# Patient Record
Sex: Male | Born: 1940 | Race: White | Hispanic: No | Marital: Married | Smoking: Former smoker
Health system: Southern US, Community
[De-identification: ages and names within clinical notes are randomized; demographics above are authoritative.]

## PROBLEM LIST (undated history)

## (undated) DIAGNOSIS — Z87442 Personal history of urinary calculi: Secondary | ICD-10-CM

## (undated) DIAGNOSIS — N400 Enlarged prostate without lower urinary tract symptoms: Secondary | ICD-10-CM

## (undated) DIAGNOSIS — K219 Gastro-esophageal reflux disease without esophagitis: Secondary | ICD-10-CM

## (undated) DIAGNOSIS — I10 Essential (primary) hypertension: Secondary | ICD-10-CM

## (undated) DIAGNOSIS — C182 Malignant neoplasm of ascending colon: Secondary | ICD-10-CM

## (undated) DIAGNOSIS — M1990 Unspecified osteoarthritis, unspecified site: Secondary | ICD-10-CM

## (undated) DIAGNOSIS — R0600 Dyspnea, unspecified: Secondary | ICD-10-CM

## (undated) DIAGNOSIS — R350 Frequency of micturition: Secondary | ICD-10-CM

---

## 1977-12-03 HISTORY — PX: NASAL SINUS SURGERY: SHX719

## 2002-03-14 ENCOUNTER — Emergency Department (HOSPITAL_COMMUNITY): Admission: EM | Admit: 2002-03-14 | Discharge: 2002-03-14 | Payer: Self-pay | Admitting: Emergency Medicine

## 2013-06-22 ENCOUNTER — Other Ambulatory Visit: Payer: Self-pay | Admitting: Orthopedic Surgery

## 2013-06-22 ENCOUNTER — Encounter (HOSPITAL_COMMUNITY): Payer: Self-pay | Admitting: Pharmacy Technician

## 2013-06-24 ENCOUNTER — Other Ambulatory Visit: Payer: Self-pay | Admitting: Orthopedic Surgery

## 2013-06-24 ENCOUNTER — Encounter (HOSPITAL_COMMUNITY): Payer: Self-pay

## 2013-06-24 ENCOUNTER — Ambulatory Visit (HOSPITAL_COMMUNITY)
Admission: RE | Admit: 2013-06-24 | Discharge: 2013-06-24 | Disposition: A | Discharge status: Home / Self Care | Payer: Medicare Other | Source: Ambulatory Visit | Attending: Orthopedic Surgery | Admitting: Orthopedic Surgery

## 2013-06-24 ENCOUNTER — Encounter (HOSPITAL_COMMUNITY)
Admission: RE | Admit: 2013-06-24 | Discharge: 2013-06-24 | Disposition: A | Discharge status: Home / Self Care | Payer: Medicare Other | Source: Ambulatory Visit | Attending: Orthopedic Surgery | Admitting: Orthopedic Surgery

## 2013-06-24 DIAGNOSIS — Z01812 Encounter for preprocedural laboratory examination: Secondary | ICD-10-CM | POA: Insufficient documentation

## 2013-06-24 DIAGNOSIS — M19019 Primary osteoarthritis, unspecified shoulder: Secondary | ICD-10-CM | POA: Insufficient documentation

## 2013-06-24 DIAGNOSIS — I498 Other specified cardiac arrhythmias: Secondary | ICD-10-CM | POA: Insufficient documentation

## 2013-06-24 DIAGNOSIS — Z01810 Encounter for preprocedural cardiovascular examination: Secondary | ICD-10-CM | POA: Insufficient documentation

## 2013-06-24 DIAGNOSIS — I10 Essential (primary) hypertension: Secondary | ICD-10-CM | POA: Insufficient documentation

## 2013-06-24 HISTORY — DX: Essential (primary) hypertension: I10

## 2013-06-24 HISTORY — DX: Unspecified osteoarthritis, unspecified site: M19.90

## 2013-06-24 HISTORY — DX: Frequency of micturition: R35.0

## 2013-06-24 LAB — URINE MICROSCOPIC-ADD ON

## 2013-06-24 LAB — COMPREHENSIVE METABOLIC PANEL
ALT: 21 U/L (ref 0–53)
Alkaline Phosphatase: 96 U/L (ref 39–117)
BUN: 17 mg/dL (ref 6–23)
CO2: 29 mEq/L (ref 19–32)
Chloride: 103 mEq/L (ref 96–112)
GFR calc Af Amer: >90 mL/min (ref >90)
GFR calc non Af Amer: 82 mL/min — ABNORMAL LOW (ref >90)
Glucose, Bld: 92 mg/dL (ref 70–99)
Potassium: 4.6 mEq/L (ref 3.5–5.1)
Sodium: 139 mEq/L (ref 135–145)
Total Bilirubin: 0.6 mg/dL (ref 0.3–1.2)
Total Protein: 7.4 g/dL (ref 6.0–8.3)

## 2013-06-24 LAB — URINALYSIS, ROUTINE W REFLEX MICROSCOPIC
Specific Gravity, Urine: 1.026 (ref 1.005–1.030)
Urobilinogen, UA: 0.2 mg/dL (ref 0.0–1.0)
pH: 6.5 (ref 5.0–8.0)

## 2013-06-24 LAB — CBC
HCT: 44.8 % (ref 39.0–52.0)
Hemoglobin: 15.7 g/dL (ref 13.0–17.0)
RBC: 4.95 MIL/uL (ref 4.22–5.81)
WBC: 7.1 10*3/uL (ref 4.0–10.5)

## 2013-06-24 LAB — PROTIME-INR: Prothrombin Time: 12.6 seconds (ref 11.6–15.2)

## 2013-06-24 LAB — TYPE AND SCREEN: Antibody Screen: NEGATIVE

## 2013-06-24 LAB — APTT: aPTT: 29 seconds (ref 24–37)

## 2013-06-24 NOTE — Progress Notes (Signed)
Consent form for total shoulder arthroplasty,reason given as left knee pain.  Prep also written for knee not shoulder.  Spoke with Sherrie at Dr. Greig Right office she will get orders clairified.

## 2013-06-24 NOTE — Pre-Procedure Instructions (Signed)
Kevin Mathews  06/24/2013   Your procedure is scheduled on:  07-01-2013  Wednesday   Report to [facility] Short Stay Center at 10:30 AM.  Call this number if you have problems the morning of surgery: [phone]   Remember:   Do not eat food or drink liquids after midnight.    Take these medicines the morning of surgery with A SIP OF WATER:    Do not wear jewelry,  Do not wear lotions, powders, or perfumes.   Do not shave 48 hours prior to surgery. Men may shave face and neck.  Do not bring valuables to the hospital.  [facility] is not responsible for any belongings or valuables.  Contacts, dentures or bridgework may not be worn into surgery.  Leave suitcase in the car. After surgery it may be brought to your room.   For patients admitted to the hospital, checkout time is 11:00 AM the day of discharge.   Patients discharged the day of surgery will not be allowed to drive home.    Special Instructions: Shower using CHG 2 nights before surgery and the night before surgery.  If you shower the day of surgery use CHG.  Use special wash - you have one bottle of CHG for all showers.  You should use approximately 1/3 of the bottle for each shower.   Please read over the following fact sheets that you were given: Pain Booklet, Coughing and Deep Breathing, Blood Transfusion Information and Surgical Site Infection Prevention

## 2013-06-30 MED ORDER — CEFAZOLIN SODIUM-DEXTROSE 2-3 GM-% IV SOLR
2.0000 g | INTRAVENOUS | Status: AC
  Administered 2013-07-01: 2 g via INTRAVENOUS
  Filled 2013-06-30: qty 50

## 2013-06-30 NOTE — Progress Notes (Signed)
Pt called and informed of time change, instructed to be at Short Stay at 0830.

## 2013-06-30 NOTE — H&P (Signed)
  [facility] [address]   [address] [place], [place] [address] [phone] A Division of [facility]  Loreta Ave, M.D.   Robert A. Thurston Hole, M.D.   Burnell Blanks, M.D.   Eulas Post, M.D.   Lunette Stands, M.D Buford Dresser, M.D.  Charlsie Quest, M.D.   Estell Harpin, M.D.   Melina Fiddler, M.D. Genene Churn. Barry Dienes, PA-C            Kirstin A. Shepperson, PA-C Josh [place], PA-C [place], [place]   RE: Jaevon, Paras                                [id number]      DOB: 04-03-1941 PROGRESS NOTE: 06-16-13 Mr. Derinda Late is a 72 year-old man who has a longstanding history of severe glenohumeral pain in his left shoulder.  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  He has failed conservative treatment including a steroid injection, anti-inflammatories, rest and physical therapy.  Symptoms are unchanged and he wishes to proceed with left shoulder total joint replacement.   Current medications: Pravastatin, Hydrochlorothiazide and baby Aspirin daily. Allergies: No known drug allergies. Past surgical history: Significant for nasal surgery on his sinuses in 1979. Past medical history: Significant for hypertension and hyperlipidemia.   Primary care physician: Dr. Kirby Funk. Review of systems: Patient denies any recent illness, lightheadedness, fevers, chills, cough, cardiac, pulmonary, GI, GU or neuro issues.  Social history: He did smoke, but stopped smoking in 1970.  He does not use alcoholic beverages.  Patient is married and lives in a two story home.  He is self-employed and is currently working.       EXAMINATION: Height: 5?8.  Weight: 176 pounds.  BMI: 26.8.  Pulse: 57.  Blood pressure: 131/75.  He is alert and oriented x 3 and in no acute distress.  Head is normocephalic, a traumatic.  PERRLA, EOMI.  Lungs: CTA bilaterally.  No wheezes.  Heart: RRR.  No murmurs.   Abdomen: Soft and non-tender.  No masses.  Examination of the left shoulder shows 20 degrees loss of flexion compared to the opposite side.  Positive pain with abduction and again only to about 90 degrees versus well over 100 on the right.  Crossed chest adduction also painful.  Otherwise neurovascularly intact.     X-RAYS: Three view x-ray of the shoulder shows the extensive degenerative changes as mentioned above with bone on bone with erosive changes on both sides of the joint.  MRI showing rotator cuff intact.    DISPOSITION:  The patient wishes to proceed with left total shoulder replacement.  Risks and benefits were discussed.  Possible complications were discussed in detail.  All of his questions were answered regarding his surgery.  He will return to see Dr. Eulah Pont postoperatively.  He hopes to go home following this procedure to begin both home and outpatient physical therapy, following what will be a simple overnight stay in the hospital.    Loreta Ave, M.D.   Electronically verified by Loreta Ave, M.D. DFM(BR):jjh D 06-18-13 T 06-19-13

## 2013-07-01 ENCOUNTER — Inpatient Hospital Stay (HOSPITAL_COMMUNITY): Payer: Medicare Other | Admitting: Anesthesiology

## 2013-07-01 ENCOUNTER — Encounter (HOSPITAL_COMMUNITY): Payer: Self-pay | Admitting: Anesthesiology

## 2013-07-01 ENCOUNTER — Encounter (HOSPITAL_COMMUNITY)
Admission: RE | Disposition: A | Discharge status: Home / Self Care | Payer: Self-pay | Source: Ambulatory Visit | Attending: Orthopedic Surgery

## 2013-07-01 ENCOUNTER — Observation Stay (HOSPITAL_COMMUNITY): Payer: Medicare Other

## 2013-07-01 ENCOUNTER — Observation Stay (HOSPITAL_COMMUNITY)
Admission: RE | Admit: 2013-07-01 | Discharge: 2013-07-02 | Disposition: A | Discharge status: Home / Self Care | Payer: Medicare Other | Source: Ambulatory Visit | Attending: Orthopedic Surgery | Admitting: Orthopedic Surgery

## 2013-07-01 DIAGNOSIS — I10 Essential (primary) hypertension: Secondary | ICD-10-CM | POA: Insufficient documentation

## 2013-07-01 DIAGNOSIS — M19019 Primary osteoarthritis, unspecified shoulder: Principal | ICD-10-CM | POA: Insufficient documentation

## 2013-07-01 DIAGNOSIS — M19012 Primary osteoarthritis, left shoulder: Secondary | ICD-10-CM | POA: Diagnosis present

## 2013-07-01 DIAGNOSIS — Z79899 Other long term (current) drug therapy: Secondary | ICD-10-CM | POA: Insufficient documentation

## 2013-07-01 DIAGNOSIS — E785 Hyperlipidemia, unspecified: Secondary | ICD-10-CM | POA: Insufficient documentation

## 2013-07-01 HISTORY — PX: TOTAL SHOULDER ARTHROPLASTY: SHX126

## 2013-07-01 SURGERY — ARTHROPLASTY, SHOULDER, TOTAL
Anesthesia: General | Site: Shoulder | Laterality: Left | Wound class: Clean

## 2013-07-01 MED ORDER — SODIUM CHLORIDE 0.9 % IV SOLN
10.0000 mg | INTRAVENOUS | Status: DC | PRN
  Administered 2013-07-01: 10 ug/min via INTRAVENOUS

## 2013-07-01 MED ORDER — LACTATED RINGERS IV SOLN: INTRAVENOUS | Status: DC

## 2013-07-01 MED ORDER — METHOCARBAMOL 100 MG/ML IJ SOLN
500.0000 mg | Freq: Four times a day (QID) | INTRAVENOUS | Status: DC | PRN
  Filled 2013-07-01: qty 5

## 2013-07-01 MED ORDER — MIDAZOLAM HCL 2 MG/2ML IJ SOLN
INTRAMUSCULAR | Status: AC
  Administered 2013-07-01: 1 mg
  Filled 2013-07-01: qty 2

## 2013-07-01 MED ORDER — GLYCOPYRROLATE 0.2 MG/ML IJ SOLN
INTRAMUSCULAR | Status: DC | PRN
  Administered 2013-07-01: 0.6 mg via INTRAVENOUS

## 2013-07-01 MED ORDER — OXYCODONE HCL 5 MG PO TABS: 5.0000 mg | ORAL_TABLET | Freq: Once | ORAL | Status: DC | PRN

## 2013-07-01 MED ORDER — DOCUSATE SODIUM 100 MG PO CAPS
100.0000 mg | ORAL_CAPSULE | Freq: Two times a day (BID) | ORAL | Status: DC
  Administered 2013-07-01 – 2013-07-02 (×2): 100 mg via ORAL
  Filled 2013-07-01 (×2): qty 1

## 2013-07-01 MED ORDER — HYDROMORPHONE HCL PF 1 MG/ML IJ SOLN
INTRAMUSCULAR | Status: AC
  Filled 2013-07-01: qty 1

## 2013-07-01 MED ORDER — LIDOCAINE HCL (CARDIAC) 20 MG/ML IV SOLN
INTRAVENOUS | Status: DC | PRN
  Administered 2013-07-01: 20 mg via INTRAVENOUS

## 2013-07-01 MED ORDER — ONDANSETRON HCL 4 MG/2ML IJ SOLN
INTRAMUSCULAR | Status: DC | PRN
  Administered 2013-07-01: 4 mg via INTRAVENOUS

## 2013-07-01 MED ORDER — HYDROMORPHONE HCL PF 1 MG/ML IJ SOLN
0.2500 mg | INTRAMUSCULAR | Status: DC | PRN
  Administered 2013-07-01 (×2): 0.5 mg via INTRAVENOUS

## 2013-07-01 MED ORDER — METOCLOPRAMIDE HCL 10 MG PO TABS
5.0000 mg | ORAL_TABLET | Freq: Three times a day (TID) | ORAL | Status: DC | PRN
  Administered 2013-07-02: 10 mg via ORAL
  Filled 2013-07-01: qty 1

## 2013-07-01 MED ORDER — LACTATED RINGERS IV SOLN
INTRAVENOUS | Status: DC
  Administered 2013-07-01 (×2): via INTRAVENOUS

## 2013-07-01 MED ORDER — SODIUM CHLORIDE 0.9 % IR SOLN
Status: DC | PRN
  Administered 2013-07-01: 1000 mL

## 2013-07-01 MED ORDER — METHOCARBAMOL 500 MG PO TABS
500.0000 mg | ORAL_TABLET | Freq: Four times a day (QID) | ORAL | Status: DC | PRN
  Administered 2013-07-01: 500 mg via ORAL
  Filled 2013-07-01: qty 1

## 2013-07-01 MED ORDER — SIMVASTATIN 20 MG PO TABS
20.0000 mg | ORAL_TABLET | Freq: Every day | ORAL | Status: DC
  Administered 2013-07-01: 20 mg via ORAL
  Filled 2013-07-01 (×2): qty 1

## 2013-07-01 MED ORDER — FENTANYL CITRATE 0.05 MG/ML IJ SOLN
INTRAMUSCULAR | Status: AC
  Administered 2013-07-01: 50 ug
  Filled 2013-07-01: qty 2

## 2013-07-01 MED ORDER — HYDROCODONE-ACETAMINOPHEN 5-325 MG PO TABS
1.0000 | ORAL_TABLET | ORAL | Status: DC | PRN
  Administered 2013-07-01: 2 via ORAL
  Filled 2013-07-01: qty 2

## 2013-07-01 MED ORDER — EPHEDRINE SULFATE 50 MG/ML IJ SOLN
INTRAMUSCULAR | Status: DC | PRN
  Administered 2013-07-01 (×2): 10 mg via INTRAVENOUS
  Administered 2013-07-01: 5 mg via INTRAVENOUS
  Administered 2013-07-01: 10 mg via INTRAVENOUS

## 2013-07-01 MED ORDER — ONDANSETRON HCL 4 MG/2ML IJ SOLN: 4.0000 mg | Freq: Four times a day (QID) | INTRAMUSCULAR | Status: DC | PRN

## 2013-07-01 MED ORDER — BUPIVACAINE-EPINEPHRINE PF 0.5-1:200000 % IJ SOLN
INTRAMUSCULAR | Status: DC | PRN
  Administered 2013-07-01: 30 mL

## 2013-07-01 MED ORDER — METOCLOPRAMIDE HCL 5 MG/ML IJ SOLN: 5.0000 mg | Freq: Three times a day (TID) | INTRAMUSCULAR | Status: DC | PRN

## 2013-07-01 MED ORDER — HYDROCHLOROTHIAZIDE 12.5 MG PO CAPS
12.5000 mg | ORAL_CAPSULE | Freq: Every day | ORAL | Status: DC
  Administered 2013-07-01 – 2013-07-02 (×2): 12.5 mg via ORAL
  Filled 2013-07-01 (×2): qty 1

## 2013-07-01 MED ORDER — FENTANYL CITRATE 0.05 MG/ML IJ SOLN
INTRAMUSCULAR | Status: DC | PRN
  Administered 2013-07-01: 50 ug via INTRAVENOUS
  Administered 2013-07-01: 100 ug via INTRAVENOUS
  Administered 2013-07-01 (×2): 50 ug via INTRAVENOUS

## 2013-07-01 MED ORDER — CEFAZOLIN SODIUM 1-5 GM-% IV SOLN
1.0000 g | Freq: Four times a day (QID) | INTRAVENOUS | Status: AC
  Administered 2013-07-02 (×2): 1 g via INTRAVENOUS
  Filled 2013-07-01 (×3): qty 50

## 2013-07-01 MED ORDER — CHLORHEXIDINE GLUCONATE 4 % EX LIQD: 60.0000 mL | Freq: Once | CUTANEOUS | Status: DC

## 2013-07-01 MED ORDER — NEOSTIGMINE METHYLSULFATE 1 MG/ML IJ SOLN
INTRAMUSCULAR | Status: DC | PRN
  Administered 2013-07-01: 3 mg via INTRAVENOUS

## 2013-07-01 MED ORDER — PROPOFOL 10 MG/ML IV BOLUS
INTRAVENOUS | Status: DC | PRN
  Administered 2013-07-01: 200 mg via INTRAVENOUS

## 2013-07-01 MED ORDER — OXYCODONE HCL 5 MG/5ML PO SOLN: 5.0000 mg | Freq: Once | ORAL | Status: DC | PRN

## 2013-07-01 MED ORDER — CEFAZOLIN SODIUM 1-5 GM-% IV SOLN
1.0000 g | Freq: Three times a day (TID) | INTRAVENOUS | Status: AC
  Administered 2013-07-02: 1 g via INTRAVENOUS
  Filled 2013-07-01 (×3): qty 50

## 2013-07-01 MED ORDER — HYDROMORPHONE HCL PF 1 MG/ML IJ SOLN: 0.5000 mg | INTRAMUSCULAR | Status: DC | PRN

## 2013-07-01 MED ORDER — OXYCODONE-ACETAMINOPHEN 5-325 MG PO TABS
1.0000 | ORAL_TABLET | ORAL | Status: DC | PRN
  Administered 2013-07-01 – 2013-07-02 (×2): 2 via ORAL
  Filled 2013-07-01 (×3): qty 2

## 2013-07-01 MED ORDER — ONDANSETRON HCL 4 MG PO TABS: 4.0000 mg | ORAL_TABLET | Freq: Four times a day (QID) | ORAL | Status: DC | PRN

## 2013-07-01 MED ORDER — ROCURONIUM BROMIDE 100 MG/10ML IV SOLN
INTRAVENOUS | Status: DC | PRN
  Administered 2013-07-01: 50 mg via INTRAVENOUS

## 2013-07-01 MED ORDER — MIDAZOLAM HCL 5 MG/5ML IJ SOLN
INTRAMUSCULAR | Status: DC | PRN
  Administered 2013-07-01: 2 mg via INTRAVENOUS

## 2013-07-01 MED ORDER — PHENYLEPHRINE HCL 10 MG/ML IJ SOLN
INTRAMUSCULAR | Status: DC | PRN
  Administered 2013-07-01: 120 ug via INTRAVENOUS
  Administered 2013-07-01 (×2): 80 ug via INTRAVENOUS

## 2013-07-01 MED ORDER — METOCLOPRAMIDE HCL 5 MG/ML IJ SOLN: 10.0000 mg | Freq: Once | INTRAMUSCULAR | Status: DC | PRN

## 2013-07-01 SURGICAL SUPPLY — 74 items
BANDAGE GAUZE ELAST BULKY 4 IN (GAUZE/BANDAGES/DRESSINGS) ×2 IMPLANT
BENZOIN TINCTURE PRP APPL 2/3 (GAUZE/BANDAGES/DRESSINGS) IMPLANT
BLADE SAW SGTL 83.5X18.5 (BLADE) ×2 IMPLANT
BOOTCOVER CLEANROOM LRG (PROTECTIVE WEAR) IMPLANT
BOWL SMART MIX CTS (DISPOSABLE) ×2 IMPLANT
BRUSH FEMORAL CANAL (MISCELLANEOUS) IMPLANT
BUR SURG 4X8 MED (BURR) IMPLANT
BURR SURG 4X8 MED (BURR)
CEMENT BONE SIMPLEX SPEEDSET (Cement) ×2 IMPLANT
CLOTH BEACON ORANGE TIMEOUT ST (SAFETY) ×2 IMPLANT
CLSR STERI-STRIP ANTIMIC 1/2X4 (GAUZE/BANDAGES/DRESSINGS) ×2 IMPLANT
COVER MAYO STAND STRL (DRAPES) ×4 IMPLANT
COVER SURGICAL LIGHT HANDLE (MISCELLANEOUS) ×2 IMPLANT
DRAPE C-ARM 42X72 X-RAY (DRAPES) IMPLANT
DRAPE INCISE IOBAN 66X45 STRL (DRAPES) ×2 IMPLANT
DRAPE U-SHAPE 47X51 STRL (DRAPES) ×2 IMPLANT
DRSG MEPILEX BORDER 4X8 (GAUZE/BANDAGES/DRESSINGS) ×2 IMPLANT
DRSG PAD ABDOMINAL 8X10 ST (GAUZE/BANDAGES/DRESSINGS) IMPLANT
DURAPREP 26ML APPLICATOR (WOUND CARE) ×2 IMPLANT
ELECT CAUTERY BLADE 6.4 (BLADE) ×2 IMPLANT
ELECT REM PT RETURN 9FT ADLT (ELECTROSURGICAL) ×2
ELECTRODE REM PT RTRN 9FT ADLT (ELECTROSURGICAL) ×1 IMPLANT
EVACUATOR 1/8 PVC DRAIN (DRAIN) IMPLANT
FACESHIELD LNG OPTICON STERILE (SAFETY) IMPLANT
GAUZE XEROFORM 5X9 LF (GAUZE/BANDAGES/DRESSINGS) IMPLANT
GLENOID SELF-PRESSURIZING SHLD (Shoulder) ×2 IMPLANT
GLOVE BIO SURGEON STRL SZ7 (GLOVE) ×2 IMPLANT
GLOVE BIO SURGEON STRL SZ8.5 (GLOVE) ×4 IMPLANT
GLOVE BIOGEL PI IND STRL 6.5 (GLOVE) ×1 IMPLANT
GLOVE BIOGEL PI IND STRL 7.0 (GLOVE) ×1 IMPLANT
GLOVE BIOGEL PI IND STRL 7.5 (GLOVE) ×1 IMPLANT
GLOVE BIOGEL PI IND STRL 8 (GLOVE) ×1 IMPLANT
GLOVE BIOGEL PI INDICATOR 6.5 (GLOVE) ×1
GLOVE BIOGEL PI INDICATOR 7.0 (GLOVE) ×1
GLOVE BIOGEL PI INDICATOR 7.5 (GLOVE) ×1
GLOVE BIOGEL PI INDICATOR 8 (GLOVE) ×1
GLOVE ORTHO TXT STRL SZ7.5 (GLOVE) ×4 IMPLANT
GLOVE SURG SS PI 6.5 STRL IVOR (GLOVE) ×2 IMPLANT
GOWN PREVENTION PLUS XLARGE (GOWN DISPOSABLE) ×4 IMPLANT
GOWN STRL NON-REIN LRG LVL3 (GOWN DISPOSABLE) ×6 IMPLANT
HANDPIECE INTERPULSE COAX TIP (DISPOSABLE)
HEAD HUMERAL SNGL RADIUS 21MM (Head) ×2 IMPLANT
KIT BASIN OR (CUSTOM PROCEDURE TRAY) ×2 IMPLANT
KIT ROOM TURNOVER OR (KITS) ×2 IMPLANT
MANIFOLD NEPTUNE II (INSTRUMENTS) ×2 IMPLANT
NEEDLE 1/2 CIR CATGUT .05X1.09 (NEEDLE) ×2 IMPLANT
NEEDLE HYPO 25GX1X1/2 BEV (NEEDLE) ×2 IMPLANT
NS IRRIG 1000ML POUR BTL (IV SOLUTION) ×2 IMPLANT
PACK SHOULDER (CUSTOM PROCEDURE TRAY) ×2 IMPLANT
PAD ARMBOARD 7.5X6 YLW CONV (MISCELLANEOUS) ×4 IMPLANT
PASSER SUT SWANSON 36MM LOOP (INSTRUMENTS) IMPLANT
SET HNDPC FAN SPRY TIP SCT (DISPOSABLE) IMPLANT
SLING ARM IMMOBILIZER LRG (SOFTGOODS) ×2 IMPLANT
SPONGE GAUZE 4X4 12PLY (GAUZE/BANDAGES/DRESSINGS) IMPLANT
SPONGE LAP 18X18 X RAY DECT (DISPOSABLE) ×2 IMPLANT
STEM HUMERAL MODULAR 128MM (Stem) ×2 IMPLANT
STRIP CLOSURE SKIN 1/2X4 (GAUZE/BANDAGES/DRESSINGS) ×2 IMPLANT
SUCTION FRAZIER TIP 10 FR DISP (SUCTIONS) ×2 IMPLANT
SUT FIBERWIRE #2 38 T-5 BLUE (SUTURE) ×8
SUT MNCRL AB 3-0 PS2 18 (SUTURE) ×2 IMPLANT
SUT SILK 2 0 (SUTURE) ×1
SUT SILK 2-0 18XBRD TIE 12 (SUTURE) ×1 IMPLANT
SUT VIC AB 2-0 CT1 27 (SUTURE) ×1
SUT VIC AB 2-0 CT1 TAPERPNT 27 (SUTURE) ×1 IMPLANT
SUTURE FIBERWR #2 38 T-5 BLUE (SUTURE) ×4 IMPLANT
SWABSTICK BENZOIN STERILE (MISCELLANEOUS) ×2 IMPLANT
SYR CONTROL 10ML LL (SYRINGE) ×2 IMPLANT
SYR TOOMEY 50ML (SYRINGE) IMPLANT
TAPE CLOTH SURG 6X10 WHT LF (GAUZE/BANDAGES/DRESSINGS) ×2 IMPLANT
TOWEL OR 17X24 6PK STRL BLUE (TOWEL DISPOSABLE) ×2 IMPLANT
TOWEL OR 17X26 10 PK STRL BLUE (TOWEL DISPOSABLE) ×2 IMPLANT
TOWER CARTRIDGE SMART MIX (DISPOSABLE) IMPLANT
TRAY FOLEY CATH 16FRSI W/METER (SET/KITS/TRAYS/PACK) ×2 IMPLANT
WATER STERILE IRR 1000ML POUR (IV SOLUTION) ×2 IMPLANT

## 2013-07-01 NOTE — Transfer of Care (Signed)
Immediate Anesthesia Transfer of Care Note  Patient: Kevin Mathews  Procedure(s) Performed: Procedure(s) with comments: TOTAL SHOULDER ARTHROPLASTY (Left) - with interscalene block  Patient Location: PACU  Anesthesia Type:General  Level of Consciousness: awake, alert , oriented and patient cooperative  Airway & Oxygen Therapy: Patient Spontanous Breathing  Post-op Assessment: Report given to PACU RN, Post -op Vital signs reviewed and stable and Patient moving all extremities  Post vital signs: Reviewed and stable  Complications: No apparent anesthesia complications

## 2013-07-01 NOTE — Interval H&P Note (Signed)
History and Physical Interval Note:  07/01/2013 8:22 AM  Irish Elders  has presented today for surgery, with the diagnosis of DJD LEFT SHOULDER  The various methods of treatment have been discussed with the patient and family. After consideration of risks, benefits and other options for treatment, the patient has consented to  Procedure(s): TOTAL SHOULDER ARTHROPLASTY (Left) as a surgical intervention .  The patient's history has been reviewed, patient examined, no change in status, stable for surgery.  I have reviewed the patient's chart and labs.  Questions were answered to the patient's satisfaction.     MURPHY,DANIEL F

## 2013-07-01 NOTE — Anesthesia Procedure Notes (Addendum)
Anesthesia Regional Block:  Interscalene brachial plexus block  Pre-Anesthetic Checklist: ,, timeout performed, Correct Patient, Correct Site, Correct Laterality, Correct Procedure, Correct Position, site marked, Risks and benefits discussed,  Surgical consent,  Pre-op evaluation,  At surgeon's request and post-op pain management  Laterality: Left  Prep: chloraprep       Needles:   Needle Type: Stimulator Needle - 40      Needle Gauge: 22 and 22 G    Additional Needles:  Procedures: nerve stimulator Interscalene brachial plexus block  Nerve Stimulator or Paresthesia:  Response: forearm twitch, 0.45 mA, 0.1 ms,   Additional Responses:   Narrative:  Start time: 07/01/2013 10:31 AM End time: 07/01/2013 10:38 AM Injection made incrementally with aspirations every 5 mL.  Performed by: Personally  Anesthesiologist: Sandford Craze, MD  Additional Notes: Pt identified in Holding room.  Monitors applied. Working IV access confirmed. Sterile prep L neck.  #22ga PNS to forearm twitch at 0.56mA threshold.  30cc 0.5% Bupivacaine with 1:200k epi injected incrementally after negative test dose.  Patient asymptomatic, VSS, no heme aspirated, tolerated well.  Sandford Craze, MD  Interscalene brachial plexus block Procedure Name: Intubation Date/Time: 07/01/2013 11:50 AM Performed by: Sharlene Dory E Pre-anesthesia Checklist: Patient identified, Emergency Drugs available, Suction available, Patient being monitored and Timeout performed Patient Re-evaluated:Patient Re-evaluated prior to inductionOxygen Delivery Method: Circle system utilized Preoxygenation: Pre-oxygenation with 100% oxygen Intubation Type: IV induction Ventilation: Mask ventilation without difficulty Laryngoscope Size: Mac and 3 Grade View: Grade I Tube type: Oral Tube size: 7.5 mm Number of attempts: 1 Airway Equipment and Method: Stylet Placement Confirmation: ETT inserted through vocal cords under direct vision,  positive ETCO2  and breath sounds checked- equal and bilateral Secured at: 23 cm Tube secured with: Tape Dental Injury: Teeth and Oropharynx as per pre-operative assessment

## 2013-07-01 NOTE — Preoperative (Signed)
Beta Blockers   Reason not to administer Beta Blockers:Not Applicable 

## 2013-07-01 NOTE — Anesthesia Preprocedure Evaluation (Addendum)
Anesthesia Evaluation  Patient identified by MRN, date of birth, ID band Patient awake    Reviewed: Allergy & Precautions, H&P , NPO status , Patient's Chart, lab work & pertinent test results  Airway Mallampati: I      Dental  (+) Teeth Intact   Pulmonary          Cardiovascular hypertension, Pt. on medications     Neuro/Psych    GI/Hepatic GERD-  ,  Endo/Other    Renal/GU      Musculoskeletal   Abdominal   Peds  Hematology   Anesthesia Other Findings   Reproductive/Obstetrics                           Anesthesia Physical Anesthesia Plan  ASA: II  Anesthesia Plan: General   Post-op Pain Management:    Induction: Intravenous  Airway Management Planned: Oral ETT  Additional Equipment:   Intra-op Plan:   Post-operative Plan: Extubation in OR  Informed Consent: I have reviewed the patients History and Physical, chart, labs and discussed the procedure including the risks, benefits and alternatives for the proposed anesthesia with the patient or authorized representative who has indicated his/her understanding and acceptance.   Dental advisory given  Plan Discussed with: CRNA and Anesthesiologist  Anesthesia Plan Comments:         Anesthesia Quick Evaluation

## 2013-07-02 MED ORDER — METHOCARBAMOL 500 MG PO TABS: 500.0000 mg | ORAL_TABLET | Freq: Four times a day (QID) | ORAL | Status: DC | PRN

## 2013-07-02 MED ORDER — OXYCODONE-ACETAMINOPHEN 5-325 MG PO TABS: 1.0000 | ORAL_TABLET | ORAL | Status: DC | PRN

## 2013-07-02 MED ORDER — DSS 100 MG PO CAPS: 100.0000 mg | ORAL_CAPSULE | Freq: Two times a day (BID) | ORAL | Status: DC

## 2013-07-02 NOTE — Progress Notes (Signed)
Subjective: 1 Day Post-Op Procedure(s) (LRB): TOTAL SHOULDER ARTHROPLASTY (Left) Patient reports pain as 3 on 0-10 scale.   No nausea,emesis  Objective: Vital signs in last 24 hours: Temp:  [97.6 F (36.4 C)-98.8 F (37.1 C)] 98.8 F (37.1 C) (07/31 0520) Pulse Rate:  [5-84] 69 (07/31 0520) Resp:  [11-20] 16 (07/31 0520) BP: (100-166)/(57-77) 122/63 mmHg (07/31 0520) SpO2:  [10 %-100 %] 95 % (07/31 0520)  Intake/Output from previous day: 07/30 0701 - 07/31 0700 In: 3400 [P.O.:1000; I.V.:1650] Out: 900 [Urine:700; Blood:200] Intake/Output this shift:    No results found for this basename: HGB,  in the last 72 hours No results found for this basename: WBC, RBC, HCT, PLT,  in the last 72 hours No results found for this basename: NA, K, CL, CO2, BUN, CREATININE, GLUCOSE, CALCIUM,  in the last 72 hours No results found for this basename: LABPT, INR,  in the last 72 hours  Neurologically intact ABD soft Intact pulses distally Excellent grip strength Dressing dry   Assessment/Plan: 1 Day Post-Op Procedure(s) (LRB): TOTAL SHOULDER ARTHROPLASTY (Left) Discharge home when voiding  ROBERTS,JANE B 07/02/2013, 8:42 AM

## 2013-07-02 NOTE — Progress Notes (Signed)
Discharge instructions given to patient and wife. Prescriptions given. Patient discharged home.

## 2013-07-02 NOTE — Op Note (Signed)
NAME:  Kevin Mathews, Kevin Mathews NO.:  [id number]  MEDICAL RECORD NO.:  [id number]  LOCATION:  [id number]                        FACILITY:  [facility]  PHYSICIAN:  Loreta Ave, M.D. DATE OF BIRTH:  [birth year]  DATE OF PROCEDURE:  07/01/2013 DATE OF DISCHARGE:                              OPERATIVE REPORT   PREOPERATIVE DIAGNOSIS:  Left shoulder end-stage degenerative arthritis with marked periarticular spurring and erosive changes, posterior glenoid.  POSTOPERATIVE DIAGNOSIS:  Left shoulder end-stage degenerative arthritis with marked periarticular spurring and erosive changes, posterior glenoid.  PROCEDURES:  Left total shoulder replacement utilizing Stryker prosthesis, cemented, pegged 48-mm glenoid component.  A press-fit 12-mm stem with a 48 x 21 humeral head.  This is utilizing the ReUnion total shoulder arthroplasty system.  SURGEON:  Loreta Ave, MD  ASSISTANT:  Margarita Rana, MD, present throughout the entire case, necessary for timely completion of procedure.  ANESTHESIA:  General.  ESTIMATED BLOOD LOSS:  200 mL.  BLOOD GIVEN:  None.  SPECIMENS:  None.  CULTURES:  None.  COMPLICATIONS:  None.  DRESSING:  Soft compressive, shoulder immobilizer.  DESCRIPTION OF PROCEDURE:  The patient was brought to the operating room, placed on the operating table in supine position.  After adequate anesthesia had been obtained, placed in a beach-chair position on the shoulder positioner, prepped and draped in usual sterile fashion. Anterior incision, deltopectoral interval.  Skin, subcu tissue divided. Deltopectoral interval was opened.  Conjoined tendon retracted.  Rotator cuff intact.  Subscap was taken down, anatomically retracted.  Utilizing appropriate guides, protected soft tissue.  The humeral head was cut at 135-degree angle, 30 degrees of retroversion.  Shoulder exposed. Periarticular spurs, loose bodies removed from the humerus and glenoid. The  humerus was delivered out.  Long-head biceps rotator cuff protected. This was brought up to good fitting for a 12-mm stem, 30 degrees retroversion.  The trial was left in place.  Attention was turned to the glenoid with appropriate retractors, brought up to good positioning, bringing the front of the glenoid down, debrided perpendicular to the shaft.  Drilled, sized, and fitted for a 48-mm component.  After appropriate trials, the wound was irrigated.  Cement prepared, the definitive 4 pegged glenoid firmly cemented in place.  Once cement hardened, attention was turned back to the humerus.  The appropriate trials were done.  Trials removed.  Definitive component was seated with a 12-mm stem.  The 48 x 21 head attached.  This gave good coverage, good sizing, a stable shoulder with full motion.  Wound irrigated.  Subscap repaired with FiberWire.  Wound irrigated again.  Deltopectoral interval closed and the skin and subcutaneous tissue with Vicryl.  Steri-Strips applied.  Sterile compressive dressing applied.  Shoulder immobilizer applied.  Anesthesia reversed.  Brought to the recovery room.  Tolerated the surgery well.  No complications.     Loreta Ave, M.D.     DFM/MEDQ  D:  07/01/2013  T:  07/02/2013  Job:  [id number]

## 2013-07-02 NOTE — Progress Notes (Signed)
UR COMPLETED  

## 2013-07-02 NOTE — Discharge Summary (Signed)
Patient ID: Kevin Mathews MRN: [id number] DOB/AGE: [birth year] 72 y.o.  Admit date: 07/01/2013 Discharge date:   Admission Diagnoses: Degenerative Joint Disease  LEFT SHOULDER Past Medical History  Diagnosis Date  . Hypertension   . Frequent urination   . Arthritis     Discharge Diagnoses:  Principal Problem:   Osteoarthritis of left shoulder   Surgeries: Procedure(s): TOTAL SHOULDER ARTHROPLASTY on 07/01/2013    Consultants:    Discharged Condition: Improved  Hospital Course: Kevin Mathews is an 72 y.o. male who was admitted 07/01/2013 with a chief complaint of No chief complaint on file. , and found to have a diagnosis of Degenerative Joint Disease  LEFT SHOULDER.  They were brought to the operating room on 07/01/2013 and underwent Procedure(s): TOTAL SHOULDER ARTHROPLASTY.    They were given perioperative antibiotics: Anti-infectives   Start     Dose/Rate Route Frequency Ordered Stop   07/02/13 0000  ceFAZolin (ANCEF) IVPB 1 g/50 mL premix     1 g 100 mL/hr over 30 Minutes Intravenous 3 times per day 07/01/13 2351 07/02/13 2159   07/01/13 1800  ceFAZolin (ANCEF) IVPB 1 g/50 mL premix     1 g 100 mL/hr over 30 Minutes Intravenous Every 6 hours 07/01/13 1748 07/02/13 1159   07/01/13 0600  ceFAZolin (ANCEF) IVPB 2 g/50 mL premix     2 g 100 mL/hr over 30 Minutes Intravenous On call to O.R. 06/30/13 1429 07/01/13 1155    .  They were given sequential compression devices, early ambulation, and Other (comment)resume advil for DVT prophylaxis.  Recent vital signs: Patient Vitals for the past 24 hrs:  BP Temp Pulse Resp SpO2  07/02/13 0520 122/63 mmHg 98.8 F (37.1 C) 69 16 95 %  07/02/13 0225 121/58 mmHg 98.4 F (36.9 C) 65 14 93 %  07/01/13 2209 100/74 mmHg 97.8 F (36.6 C) 84 16 98 %  07/01/13 1738 131/67 mmHg 97.9 F (36.6 C) 67 18 100 %  07/01/13 1700 - 97.6 F (36.4 C) - - -  07/01/13 1659 - - 64 17 98 %  07/01/13 1655 134/57 mmHg - 60 20 99 %  07/01/13  1645 - - 62 15 94 %  07/01/13 1640 128/72 mmHg - 58 11 97 %  07/01/13 1630 - - 57 14 96 %  07/01/13 1625 133/59 mmHg - 64 11 94 %  07/01/13 1615 - - 54 12 98 %  07/01/13 1610 132/58 mmHg - 56 12 96 %  07/01/13 1600 - - 59 14 99 %  07/01/13 1555 144/64 mmHg - 56 13 99 %  07/01/13 1545 - - 54 11 96 %  07/01/13 1540 129/77 mmHg - 57 18 97 %  07/01/13 1537 - 98.6 F (37 C) 61 17 80 %  07/01/13 1030 146/76 mmHg - 50 13 100 %  07/01/13 0917 166/72 mmHg 97.7 F (36.5 C) 5 18 10  %  .  Recent laboratory studies: Dg Shoulder Left Port  07/01/2013   *RADIOLOGY REPORT*  Clinical Data: Post replacement, osteoarthritis  PORTABLE LEFT SHOULDER - 2+ VIEW  Comparison: 06/24/2013  Findings: Left humeral head prosthesis projects in expected location on this single image.  Advanced degenerative changes of the glenoid are evident.  No fracture or other apparent abnormality.  IMPRESSION:  Left shoulder arthroplasty without fracture or other apparent complication.   Original Report Authenticated By: D. Andria Rhein, MD    Discharge Medications:     Medication List  DSS 100 MG Caps  Take 100 mg by mouth 2 (two) times daily.     hydrochlorothiazide 12.5 MG capsule  Commonly known as:  MICROZIDE  Take 12.5 mg by mouth daily.     ibuprofen 200 MG tablet  Commonly known as:  ADVIL,MOTRIN  Take 400 mg by mouth every 6 (six) hours as needed for pain.     methocarbamol 500 MG tablet  Commonly known as:  ROBAXIN  Take 1 tablet (500 mg total) by mouth every 6 (six) hours as needed.     oxyCODONE-acetaminophen 5-325 MG per tablet  Commonly known as:  PERCOCET/ROXICET  Take 1-2 tablets by mouth every 4 (four) hours as needed.     pravastatin 40 MG tablet  Commonly known as:  PRAVACHOL  Take 40 mg by mouth daily.        Diagnostic Studies: Dg Chest 2 View  06/24/2013   *RADIOLOGY REPORT*  Clinical Data: Hypertension.  CHEST - 2 VIEW  Comparison: None.  Findings: Cardiomediastinal silhouette  appears normal.  No acute pulmonary disease is noted.  Bony thorax is intact.  IMPRESSION: No acute cardiopulmonary abnormality seen.   Original Report Authenticated By: Lupita Raider.,  M.D.   Dg Shoulder Left Port  07/01/2013   *RADIOLOGY REPORT*  Clinical Data: Post replacement, osteoarthritis  PORTABLE LEFT SHOULDER - 2+ VIEW  Comparison: 06/24/2013  Findings: Left humeral head prosthesis projects in expected location on this single image.  Advanced degenerative changes of the glenoid are evident.  No fracture or other apparent abnormality.  IMPRESSION:  Left shoulder arthroplasty without fracture or other apparent complication.   Original Report Authenticated By: D. Andria Rhein, MD    They benefited maximally from their hospital stay and there were no complications.     Disposition: Final discharge disposition not confirmed     Discharge Orders   Future Orders Complete By Expires     Call MD / Call 911  As directed     Comments:      If you experience chest pain or shortness of breath, CALL 911 and be transported to the hospital emergency room.  If you develope a fever above 101 F, pus (white drainage) or increased drainage or redness at the wound, or calf pain, call your surgeon's office.    Constipation Prevention  As directed     Comments:      Drink plenty of fluids.  Prune juice and/or coffee may be helpful.  You may use a stool softener, such as Colace (over the counter) 100 mg twice a day.  Use MiraLax (over the counter) for constipation as needed but this may take several days to work.  Mag Citrate --OR-- Milk of Magnesia may also be used but follow directions on the label.    Diet - low sodium heart healthy  As directed     Discharge instructions  As directed     Comments:      Keep incision clean and dry Keep left arm in sling/immobilizer Use bolster pillow beside left side under elbow Move fingers and hand often Call the office if you develop a fever over 101 degrees, your  pain is increasing, or your arm develops red streaks or swelling    Driving restrictions  As directed     Comments:      No driving for until cleared by dr Nolberto Hanlon activity slowly as tolerated  As directed       Follow-up Information  Follow up with Loreta Ave, MD. Schedule an appointment as soon as possible for a visit in 1 week.   Contact information:   [address] [place] [address] [phone]        Signed: Clarene Critchley 07/02/2013, 8:58 AM

## 2013-07-03 ENCOUNTER — Encounter (HOSPITAL_COMMUNITY): Payer: Self-pay | Admitting: Orthopedic Surgery

## 2013-07-03 NOTE — Anesthesia Postprocedure Evaluation (Signed)
Pt discharged

## 2013-07-09 NOTE — Discharge Summary (Signed)
Physician Discharge Summary  Patient ID: Kevin Mathews MRN: [id number] DOB/AGE: [birth year] 72 y.o.  Admit date: 07/01/2013 Discharge date: 07/02/2013  Admission Diagnoses:  Osteoarthritis of left shoulder  Discharge Diagnoses:  Principal Problem:   Osteoarthritis of left shoulder   Past Medical History  Diagnosis Date  . Hypertension   . Frequent urination   . Arthritis     Surgeries: Procedure(s): TOTAL SHOULDER ARTHROPLASTY on 07/01/2013   Consultants (if any):    Discharged Condition: Improved  Hospital Course: Kevin Mathews is an 72 y.o. male who was admitted 07/01/2013 with a diagnosis of Osteoarthritis of left shoulder and went to the operating room on 07/01/2013 and underwent the above named procedures.    He was given perioperative antibiotics:  Anti-infectives   Start     Dose/Rate Route Frequency Ordered Stop   07/02/13 0000  ceFAZolin (ANCEF) IVPB 1 g/50 mL premix     1 g 100 mL/hr over 30 Minutes Intravenous 3 times per day 07/01/13 2351 07/02/13 1302   07/01/13 1800  ceFAZolin (ANCEF) IVPB 1 g/50 mL premix     1 g 100 mL/hr over 30 Minutes Intravenous Every 6 hours 07/01/13 1748 07/02/13 1159   07/01/13 0600  ceFAZolin (ANCEF) IVPB 2 g/50 mL premix     2 g 100 mL/hr over 30 Minutes Intravenous On call to O.R. 06/30/13 1429 07/01/13 1155    .  He was given sequential compression devices, early ambulation, and Aspirin for DVT prophylaxis.  He benefited maximally from the hospital stay and there were no complications.    Recent vital signs:  Filed Vitals:   07/02/13 0520  BP: 122/63  Pulse: 69  Temp: 98.8 F (37.1 C)  Resp: 16    Recent laboratory studies:  Lab Results  Component Value Date   HGB 15.7 06/24/2013   Lab Results  Component Value Date   WBC 7.1 06/24/2013   PLT 209 06/24/2013   Lab Results  Component Value Date   INR 0.96 06/24/2013   Lab Results  Component Value Date   NA 139 06/24/2013   K 4.6 06/24/2013   CL 103 06/24/2013    CO2 29 06/24/2013   BUN 17 06/24/2013   CREATININE 0.95 06/24/2013   GLUCOSE 92 06/24/2013    Discharge Medications:     Medication List         DSS 100 MG Caps  Take 100 mg by mouth 2 (two) times daily.     hydrochlorothiazide 12.5 MG capsule  Commonly known as:  MICROZIDE  Take 12.5 mg by mouth daily.     ibuprofen 200 MG tablet  Commonly known as:  ADVIL,MOTRIN  Take 400 mg by mouth every 6 (six) hours as needed for pain.     methocarbamol 500 MG tablet  Commonly known as:  ROBAXIN  Take 1 tablet (500 mg total) by mouth every 6 (six) hours as needed.     oxyCODONE-acetaminophen 5-325 MG per tablet  Commonly known as:  PERCOCET/ROXICET  Take 1-2 tablets by mouth every 4 (four) hours as needed.     pravastatin 40 MG tablet  Commonly known as:  PRAVACHOL  Take 40 mg by mouth daily.        Diagnostic Studies: Dg Chest 2 View  06/24/2013   *RADIOLOGY REPORT*  Clinical Data: Hypertension.  CHEST - 2 VIEW  Comparison: None.  Findings: Cardiomediastinal silhouette appears normal.  No acute pulmonary disease is noted.  Bony thorax is intact.  IMPRESSION:  No acute cardiopulmonary abnormality seen.   Original Report Authenticated By: Lupita Raider.,  M.D.   Dg Shoulder Left Port  07/01/2013   *RADIOLOGY REPORT*  Clinical Data: Post replacement, osteoarthritis  PORTABLE LEFT SHOULDER - 2+ VIEW  Comparison: 06/24/2013  Findings: Left humeral head prosthesis projects in expected location on this single image.  Advanced degenerative changes of the glenoid are evident.  No fracture or other apparent abnormality.  IMPRESSION:  Left shoulder arthroplasty without fracture or other apparent complication.   Original Report Authenticated By: D. Andria Rhein, MD    Disposition: 01-Home or Self Care      Discharge Orders   Future Orders Complete By Expires     Call MD / Call 911  As directed     Comments:      If you experience chest pain or shortness of breath, CALL 911 and be  transported to the hospital emergency room.  If you develope a fever above 101 F, pus (white drainage) or increased drainage or redness at the wound, or calf pain, call your surgeon's office.    Constipation Prevention  As directed     Comments:      Drink plenty of fluids.  Prune juice and/or coffee may be helpful.  You may use a stool softener, such as Colace (over the counter) 100 mg twice a day.  Use MiraLax (over the counter) for constipation as needed but this may take several days to work.  Mag Citrate --OR-- Milk of Magnesia may also be used but follow directions on the label.    Diet - low sodium heart healthy  As directed     Discharge instructions  As directed     Comments:      Keep incision clean and dry Keep left arm in sling/immobilizer Use bolster pillow beside left side under elbow Move fingers and hand often Call the office if you develop a fever over 101 degrees, your pain is increasing, or your arm develops red streaks or swelling    Driving restrictions  As directed     Comments:      No driving for until cleared by dr Eulah Pont    Increase activity slowly as tolerated  As directed        Follow-up Information   Follow up with [facility], MD. Schedule an appointment as soon as possible for a visit in 1 week.   Contact information:   [address] [place] [address] [phone]        Signed: Loreta Ave 07/09/2013, 4:47 PM

## 2015-09-15 ENCOUNTER — Other Ambulatory Visit (HOSPITAL_COMMUNITY): Payer: Self-pay | Admitting: Urology

## 2015-09-15 DIAGNOSIS — D49519 Neoplasm of unspecified behavior of unspecified kidney: Secondary | ICD-10-CM

## 2015-09-23 ENCOUNTER — Ambulatory Visit (HOSPITAL_COMMUNITY)
Admission: RE | Admit: 2015-09-23 | Discharge: 2015-09-23 | Disposition: A | Discharge status: Home / Self Care | Payer: Commercial Managed Care - HMO | Source: Ambulatory Visit | Attending: Urology | Admitting: Urology

## 2015-09-23 ENCOUNTER — Inpatient Hospital Stay (HOSPITAL_COMMUNITY): Admission: RE | Admit: 2015-09-23 | Payer: Self-pay | Source: Ambulatory Visit

## 2015-09-23 DIAGNOSIS — N289 Disorder of kidney and ureter, unspecified: Secondary | ICD-10-CM | POA: Insufficient documentation

## 2015-09-23 DIAGNOSIS — K449 Diaphragmatic hernia without obstruction or gangrene: Secondary | ICD-10-CM | POA: Insufficient documentation

## 2015-09-23 DIAGNOSIS — N281 Cyst of kidney, acquired: Secondary | ICD-10-CM | POA: Insufficient documentation

## 2015-09-23 MED ORDER — GADOBENATE DIMEGLUMINE 529 MG/ML IV SOLN
20.0000 mL | Freq: Once | INTRAVENOUS | Status: AC | PRN
  Administered 2015-09-23: 16 mL via INTRAVENOUS

## 2017-01-18 DIAGNOSIS — Z0000 Encounter for general adult medical examination without abnormal findings: Secondary | ICD-10-CM | POA: Diagnosis not present

## 2017-01-18 DIAGNOSIS — Z1211 Encounter for screening for malignant neoplasm of colon: Secondary | ICD-10-CM | POA: Diagnosis not present

## 2017-01-18 DIAGNOSIS — Z1389 Encounter for screening for other disorder: Secondary | ICD-10-CM | POA: Diagnosis not present

## 2017-02-28 ENCOUNTER — Other Ambulatory Visit: Payer: Self-pay | Admitting: Gastroenterology

## 2017-04-12 ENCOUNTER — Encounter (HOSPITAL_COMMUNITY): Payer: Self-pay

## 2017-04-15 ENCOUNTER — Ambulatory Visit (HOSPITAL_COMMUNITY): Payer: PPO | Admitting: Anesthesiology

## 2017-04-15 ENCOUNTER — Ambulatory Visit (HOSPITAL_COMMUNITY)
Admission: RE | Admit: 2017-04-15 | Discharge: 2017-04-15 | Disposition: A | Discharge status: Home / Self Care | Payer: PPO | Source: Ambulatory Visit | Attending: Gastroenterology | Admitting: Gastroenterology

## 2017-04-15 ENCOUNTER — Encounter (HOSPITAL_COMMUNITY)
Admission: RE | Disposition: A | Discharge status: Home / Self Care | Payer: Self-pay | Source: Ambulatory Visit | Attending: Gastroenterology

## 2017-04-15 ENCOUNTER — Encounter (HOSPITAL_COMMUNITY): Payer: Self-pay | Admitting: *Deleted

## 2017-04-15 DIAGNOSIS — Z1211 Encounter for screening for malignant neoplasm of colon: Secondary | ICD-10-CM | POA: Insufficient documentation

## 2017-04-15 DIAGNOSIS — Z87442 Personal history of urinary calculi: Secondary | ICD-10-CM | POA: Insufficient documentation

## 2017-04-15 DIAGNOSIS — Z96612 Presence of left artificial shoulder joint: Secondary | ICD-10-CM | POA: Insufficient documentation

## 2017-04-15 DIAGNOSIS — K6389 Other specified diseases of intestine: Secondary | ICD-10-CM | POA: Diagnosis not present

## 2017-04-15 DIAGNOSIS — D124 Benign neoplasm of descending colon: Secondary | ICD-10-CM | POA: Diagnosis not present

## 2017-04-15 DIAGNOSIS — J302 Other seasonal allergic rhinitis: Secondary | ICD-10-CM | POA: Insufficient documentation

## 2017-04-15 DIAGNOSIS — N400 Enlarged prostate without lower urinary tract symptoms: Secondary | ICD-10-CM | POA: Insufficient documentation

## 2017-04-15 DIAGNOSIS — Z87891 Personal history of nicotine dependence: Secondary | ICD-10-CM | POA: Insufficient documentation

## 2017-04-15 DIAGNOSIS — I10 Essential (primary) hypertension: Secondary | ICD-10-CM | POA: Diagnosis not present

## 2017-04-15 DIAGNOSIS — L719 Rosacea, unspecified: Secondary | ICD-10-CM | POA: Diagnosis not present

## 2017-04-15 DIAGNOSIS — C188 Malignant neoplasm of overlapping sites of colon: Secondary | ICD-10-CM | POA: Diagnosis not present

## 2017-04-15 DIAGNOSIS — C180 Malignant neoplasm of cecum: Secondary | ICD-10-CM | POA: Insufficient documentation

## 2017-04-15 DIAGNOSIS — D490 Neoplasm of unspecified behavior of digestive system: Secondary | ICD-10-CM | POA: Diagnosis not present

## 2017-04-15 HISTORY — PX: COLONOSCOPY WITH PROPOFOL: SHX5780

## 2017-04-15 LAB — POCT I-STAT 4, (NA,K, GLUC, HGB,HCT)
GLUCOSE: 96 mg/dL (ref 65–99)
HCT: 32 % — ABNORMAL LOW (ref 39.0–52.0)
Hemoglobin: 10.9 g/dL — ABNORMAL LOW (ref 13.0–17.0)
POTASSIUM: 4.1 mmol/L (ref 3.5–5.1)
SODIUM: 140 mmol/L (ref 135–145)

## 2017-04-15 SURGERY — COLONOSCOPY WITH PROPOFOL
Anesthesia: Monitor Anesthesia Care

## 2017-04-15 MED ORDER — SPOT INK MARKER SYRINGE KIT
PACK | SUBMUCOSAL | Status: AC
  Filled 2017-04-15: qty 5

## 2017-04-15 MED ORDER — PROPOFOL 500 MG/50ML IV EMUL
INTRAVENOUS | Status: DC | PRN
  Administered 2017-04-15: 150 ug/kg/min via INTRAVENOUS

## 2017-04-15 MED ORDER — PROPOFOL 10 MG/ML IV BOLUS
INTRAVENOUS | Status: AC
  Filled 2017-04-15: qty 40

## 2017-04-15 MED ORDER — PROPOFOL 10 MG/ML IV BOLUS
INTRAVENOUS | Status: DC | PRN
  Administered 2017-04-15: 20 mg via INTRAVENOUS
  Administered 2017-04-15: 70 mg via INTRAVENOUS
  Administered 2017-04-15: 20 mg via INTRAVENOUS

## 2017-04-15 MED ORDER — SODIUM CHLORIDE 0.9 % IV SOLN: INTRAVENOUS | Status: DC

## 2017-04-15 MED ORDER — SPOT INK MARKER SYRINGE KIT
PACK | SUBMUCOSAL | Status: DC | PRN
  Administered 2017-04-15: 3 mL via SUBMUCOSAL

## 2017-04-15 MED ORDER — LACTATED RINGERS IV SOLN
INTRAVENOUS | Status: DC
  Administered 2017-04-15: 14:00:00 via INTRAVENOUS

## 2017-04-15 SURGICAL SUPPLY — 21 items

## 2017-04-15 NOTE — Transfer of Care (Signed)
Immediate Anesthesia Transfer of Care Note  Patient: Kevin Mathews  Procedure(s) Performed: Procedure(s): COLONOSCOPY WITH PROPOFOL (N/A)  Patient Location: PACU  Anesthesia Type:MAC  Level of Consciousness: Patient easily awoken, sedated, comfortable, cooperative, following commands, responds to stimulation.   Airway & Oxygen Therapy: Patient spontaneously breathing, ventilating well, oxygen via simple oxygen mask.  Post-op Assessment: Report given to PACU RN, vital signs reviewed and stable, moving all extremities.   Post vital signs: Reviewed and stable.  Complications: No apparent anesthesia complications Last Vitals:  Vitals:   04/15/17 1305  BP: 133/60  Pulse: 61  Resp: 16  Temp: 36.7 C    Last Pain:  Vitals:   04/15/17 1305  TempSrc: Oral         Complications: No apparent anesthesia complications

## 2017-04-15 NOTE — Op Note (Signed)
[facility] Patient Name: Kevin Mathews Procedure Date: 04/15/2017 MRN: [id number] Attending MD: Garlan Fair , MD Date of Birth: 12-22-1940 CSN: [id number] Age: 76 Admit Type: Outpatient Procedure:                Colonoscopy Indications:              Screening for colorectal malignant neoplasm.                            04/15/2007 normal baseline screening colonoscopy was                            performed. Providers:                Garlan Fair, MD, Laverta Baltimore RN, RN,                            Cherylynn Ridges, Technician, Heide Scales, CRNA Referring MD:              Medicines:                Propofol per Anesthesia Complications:            No immediate complications. Estimated Blood Loss:     Estimated blood loss was minimal. Procedure:                Pre-Anesthesia Assessment:                           - Prior to the procedure, a History and Physical                            was performed, and patient medications and                            allergies were reviewed. The patient's tolerance of                            previous anesthesia was also reviewed. The risks                            and benefits of the procedure and the sedation                            options and risks were discussed with the patient.                            All questions were answered, and informed consent                            was obtained. Prior Anticoagulants: The patient has                            taken aspirin, last dose was 1 day prior to  procedure. ASA Grade Assessment: II - A patient                            with mild systemic disease. After reviewing the                            risks and benefits, the patient was deemed in                            satisfactory condition to undergo the procedure.                           After obtaining informed consent, the colonoscope                            was passed  under direct vision. Throughout the                            procedure, the patient's blood pressure, pulse, and                            oxygen saturations were monitored continuously. The                            EC-[id number] ([id number]) scope was introduced through                            the anus and advanced to the the cecum, identified                            by appendiceal orifice and ileocecal valve. The                            colonoscopy was technically difficult and complex                            due to significant looping. The patient tolerated                            the procedure well. The quality of the bowel                            preparation was good. The appendiceal orifice and                            the rectum were photographed. Scope In: 1:52:25 PM Scope Out: 2:24:50 PM Scope Withdrawal Time: 0 hours 23 minutes 19 seconds  Total Procedure Duration: 0 hours 32 minutes 25 seconds  Findings:      The perianal and digital rectal examinations were normal.      A 5 mm polyp was found in the proximal descending colon. The polyp was       sessile. The polyp was removed with a cold snare. Resection and       retrieval were  complete.      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 This was biopsied with a cold forceps for       histology. The distal aspect of the tumor was tattooed.      The exam was otherwise without abnormality. Impression:               - One 5 mm polyp in the proximal descending colon,                            removed with a cold snare. Resected and retrieved.                           - Tumor in the proximal ascending colon. Biopsied.                           - The examination was otherwise normal. Moderate Sedation:      N/A- Per Anesthesia Care Recommendation:           - Patient has a contact number available for                            emergencies. The signs  and symptoms of potential                            delayed complications were discussed with the                            patient. Return to normal activities tomorrow.                            Written discharge instructions were provided to the                            patient.                           - Repeat colonoscopy date to be determined after                            pending pathology results are reviewed for                            surveillance.                           - Resume previous diet.                           - Continue present medications. Procedure Code(s):        --- Professional ---                           [phone], Colonoscopy, flexible; with removal of  tumor(s), polyp(s), or other lesion(s) by snare                            technique                           45380, 59, Colonoscopy, flexible; with biopsy,                            single or multiple Diagnosis Code(s):        --- Professional ---                           Z12.11, Encounter for screening for malignant                            neoplasm of colon                           D12.4, Benign neoplasm of descending colon                           D49.0, Neoplasm of unspecified behavior of                            digestive system CPT copyright 2016 American Medical Association. All rights reserved. The codes documented in this report are preliminary and upon coder review may  be revised to meet current compliance requirements. Earle Gell, MD Garlan Fair, MD 04/15/2017 2:35:33 PM This report has been signed electronically. Number of Addenda: 0

## 2017-04-15 NOTE — Anesthesia Preprocedure Evaluation (Addendum)
Anesthesia Evaluation  Patient identified by MRN, date of birth, ID band Patient awake    Reviewed: Allergy & Precautions, NPO status , Patient's Chart, lab work & pertinent test results  Airway Mallampati: III  TM Distance: >3 FB Neck ROM: Full    Dental no notable dental hx.    Pulmonary neg pulmonary ROS, former smoker,    Pulmonary exam normal breath sounds clear to auscultation       Cardiovascular Exercise Tolerance: Good hypertension, Pt. on medications Normal cardiovascular exam Rhythm:Regular Rate:Normal     Neuro/Psych negative neurological ROS  negative psych ROS   GI/Hepatic negative GI ROS, Neg liver ROS,   Endo/Other  negative endocrine ROS  Renal/GU negative Renal ROS  negative genitourinary   Musculoskeletal negative musculoskeletal ROS (+)   Abdominal   Peds negative pediatric ROS (+)  Hematology negative hematology ROS (+)   Anesthesia Other Findings   Reproductive/Obstetrics negative OB ROS                            Anesthesia Physical Anesthesia Plan  ASA: II  Anesthesia Plan: MAC   Post-op Pain Management:    Induction: Intravenous  Airway Management Planned: Simple Face Mask  Additional Equipment:   Intra-op Plan:   Post-operative Plan:   Informed Consent: I have reviewed the patients History and Physical, chart, labs and discussed the procedure including the risks, benefits and alternatives for the proposed anesthesia with the patient or authorized representative who has indicated his/her understanding and acceptance.   Dental advisory given  Plan Discussed with: CRNA and Surgeon  Anesthesia Plan Comments:         Anesthesia Quick Evaluation

## 2017-04-15 NOTE — H&P (Signed)
Procedure: Screening colonoscopy. Normal screening colonoscopy was performed on 04/15/2007  History: The patient is a 76 year old male born 04-15-1941. He is scheduled to undergo a repeat screening colonoscopy today.  Past medical history: Left shoulder replacement surgery. Hypertension. Benign prostatic hypertrophy. Seasonal allergic rhinitis. Acne rosacea. Kidney stones.  Exam: The patient is alert and lying comfortably on the endoscopy stretcher. Abdomen is soft and nontender to palpation. Lungs are clear to auscultation. Cardiac exam reveals a regular rhythm.  Plan: Proceed with screening colonoscopy

## 2017-04-15 NOTE — Discharge Instructions (Signed)

## 2017-04-15 NOTE — Anesthesia Postprocedure Evaluation (Signed)
Anesthesia Post Note  Patient: Kevin Mathews  Procedure(s) Performed: Procedure(s) (LRB): COLONOSCOPY WITH PROPOFOL (N/A)  Patient location during evaluation: PACU Anesthesia Type: MAC Level of consciousness: awake and alert Pain management: pain level controlled Vital Signs Assessment: post-procedure vital signs reviewed and stable Respiratory status: spontaneous breathing, nonlabored ventilation, respiratory function stable and patient connected to nasal cannula oxygen Cardiovascular status: stable and blood pressure returned to baseline Anesthetic complications: no       Last Vitals:  Vitals:   04/15/17 1450 04/15/17 1502  BP: 124/70 119/60  Pulse: (!) 54 (!) 50  Resp: 17 12  Temp:      Last Pain:  Vitals:   04/15/17 1440  TempSrc: Oral                 Iris Tatsch P Ambria Mayfield

## 2017-04-16 ENCOUNTER — Encounter (HOSPITAL_COMMUNITY): Payer: Self-pay | Admitting: Gastroenterology

## 2017-04-17 ENCOUNTER — Other Ambulatory Visit: Payer: Self-pay | Admitting: Internal Medicine

## 2017-04-17 DIAGNOSIS — C182 Malignant neoplasm of ascending colon: Secondary | ICD-10-CM

## 2017-04-19 DIAGNOSIS — C182 Malignant neoplasm of ascending colon: Secondary | ICD-10-CM | POA: Diagnosis not present

## 2017-04-23 ENCOUNTER — Other Ambulatory Visit: Payer: Commercial Managed Care - HMO

## 2017-04-25 ENCOUNTER — Other Ambulatory Visit: Payer: Commercial Managed Care - HMO

## 2017-04-25 ENCOUNTER — Ambulatory Visit
Admission: RE | Admit: 2017-04-25 | Discharge: 2017-04-25 | Disposition: A | Discharge status: Home / Self Care | Payer: PPO | Source: Ambulatory Visit | Attending: Internal Medicine | Admitting: Internal Medicine

## 2017-04-25 DIAGNOSIS — C189 Malignant neoplasm of colon, unspecified: Secondary | ICD-10-CM | POA: Diagnosis not present

## 2017-04-25 DIAGNOSIS — C182 Malignant neoplasm of ascending colon: Secondary | ICD-10-CM

## 2017-04-25 DIAGNOSIS — R918 Other nonspecific abnormal finding of lung field: Secondary | ICD-10-CM | POA: Diagnosis not present

## 2017-04-25 MED ORDER — IOPAMIDOL (ISOVUE-300) INJECTION 61%
100.0000 mL | Freq: Once | INTRAVENOUS | Status: AC | PRN
  Administered 2017-04-25: 100 mL via INTRAVENOUS

## 2017-04-26 DIAGNOSIS — R3914 Feeling of incomplete bladder emptying: Secondary | ICD-10-CM | POA: Diagnosis not present

## 2017-04-26 DIAGNOSIS — N401 Enlarged prostate with lower urinary tract symptoms: Secondary | ICD-10-CM | POA: Diagnosis not present

## 2017-05-07 ENCOUNTER — Other Ambulatory Visit: Payer: Self-pay | Admitting: Surgery

## 2017-05-07 DIAGNOSIS — C182 Malignant neoplasm of ascending colon: Secondary | ICD-10-CM | POA: Diagnosis not present

## 2017-06-04 NOTE — Patient Instructions (Signed)
Kevin Mathews  06/04/2017   Your procedure is scheduled on: 06/12/17  Report to [facility] Main  Entrance Take [place]  elevators to 3rd floor to  [place] at      [place] AM.     Call this number if you have problems the morning of surgery [phone]    Remember: ONLY 1 PERSON MAY GO WITH YOU TO SHORT STAY TO GET  READY MORNING OF YOUR SURGERY.  Do not eat food or drink liquids :After Midnight.  Follow a clear liquid diet the day of your bowel prep. Follow bowel prep per M.D. instructions   Take these medicines the morning of surgery with A SIP OF WATER: NONE                                You may not have any metal on your body including hair pins and              piercings  Do not wear jewelry,  lotions, powders or perfumes, deodorant                       Men may shave face and neck.   Do not bring valuables to the hospital. [place].  Contacts, dentures or bridgework may not be worn into surgery.  Leave suitcase in the car. After surgery it may be brought to your room.               Please read over the following fact sheets you were given: _____________________________________________________________________            [facility] - Preparing for Surgery Before surgery, you can play an important role.  Because skin is not sterile, your skin needs to be as free of germs as possible.  You can reduce the number of germs on your skin by washing with CHG (chlorahexidine gluconate) soap before surgery.  CHG is an antiseptic cleaner which kills germs and bonds with the skin to continue killing germs even after washing. Please DO NOT use if you have an allergy to CHG or antibacterial soaps.  If your skin becomes reddened/irritated stop using the CHG and inform your nurse when you arrive at Short Stay. Do not shave (including legs and underarms) for at least 48 hours prior to the first CHG shower.  You may  shave your face/neck. Please follow these instructions carefully:  1.  Shower with CHG Soap the night before surgery and the  morning of Surgery.  2.  If you choose to wash your hair, wash your hair first as usual with your  normal  shampoo.  3.  After you shampoo, rinse your hair and body thoroughly to remove the  shampoo.                           4.  Use CHG as you would any other liquid soap.  You can apply chg directly  to the skin and wash                       Gently with a scrungie or clean washcloth.  5.  Apply  the CHG Soap to your body ONLY FROM THE NECK DOWN.   Do not use on face/ open                           Wound or open sores. Avoid contact with eyes, ears mouth and genitals (private parts).                       Wash face,  Genitals (private parts) with your normal soap.             6.  Wash thoroughly, paying special attention to the area where your surgery  will be performed.  7.  Thoroughly rinse your body with warm water from the neck down.  8.  DO NOT shower/wash with your normal soap after using and rinsing off  the CHG Soap.                9.  Pat yourself dry with a clean towel.            10.  Wear clean pajamas.            11.  Place clean sheets on your bed the night of your first shower and do not  sleep with pets. Day of Surgery : Do not apply any lotions/deodorants the morning of surgery.  Please wear clean clothes to the hospital/surgery center.  FAILURE TO FOLLOW THESE INSTRUCTIONS MAY RESULT IN THE CANCELLATION OF YOUR SURGERY PATIENT SIGNATURE_________________________________  NURSE SIGNATURE__________________________________  ________________________________________________________________________  WHAT IS A BLOOD TRANSFUSION? Blood Transfusion Information  A transfusion is the replacement of blood or some of its parts. Blood is made up of multiple cells which provide different functions.  Red blood cells carry oxygen and are used for blood loss  replacement.  White blood cells fight against infection.  Platelets control bleeding.  Plasma helps clot blood.  Other blood products are available for specialized needs, such as hemophilia or other clotting disorders. BEFORE THE TRANSFUSION  Who gives blood for transfusions?   Healthy volunteers who are fully evaluated to make sure their blood is safe. This is blood bank blood. Transfusion therapy is the safest it has ever been in the practice of medicine. Before blood is taken from a donor, a complete history is taken to make sure that person has no history of diseases nor engages in risky social behavior (examples are intravenous drug use or sexual activity with multiple partners). The donor's travel history is screened to minimize risk of transmitting infections, such as malaria. The donated blood is tested for signs of infectious diseases, such as HIV and hepatitis. The blood is then tested to be sure it is compatible with you in order to minimize the chance of a transfusion reaction. If you or a relative donates blood, this is often done in anticipation of surgery and is not appropriate for emergency situations. It takes many days to process the donated blood. RISKS AND COMPLICATIONS Although transfusion therapy is very safe and saves many lives, the main dangers of transfusion include:   Getting an infectious disease.  Developing a transfusion reaction. This is an allergic reaction to something in the blood you were given. Every precaution is taken to prevent this. The decision to have a blood transfusion has been considered carefully by your caregiver before blood is given. Blood is not given unless the benefits outweigh the risks. AFTER THE TRANSFUSION  Right after receiving a blood  transfusion, you will usually feel much better and more energetic. This is especially true if your red blood cells have gotten low (anemic). The transfusion raises the level of the red blood cells which  carry oxygen, and this usually causes an energy increase.  The nurse administering the transfusion will monitor you carefully for complications. HOME CARE INSTRUCTIONS  No special instructions are needed after a transfusion. You may find your energy is better. Speak with your caregiver about any limitations on activity for underlying diseases you may have. SEEK MEDICAL CARE IF:   Your condition is not improving after your transfusion.  You develop redness or irritation at the intravenous (IV) site. SEEK IMMEDIATE MEDICAL CARE IF:  Any of the following symptoms occur over the next 12 hours:  Shaking chills.  You have a temperature by mouth above 102 F (38.9 C), not controlled by medicine.  Chest, back, or muscle pain.  People around you feel you are not acting correctly or are confused.  Shortness of breath or difficulty breathing.  Dizziness and fainting.  You get a rash or develop hives.  You have a decrease in urine output.  Your urine turns a dark color or changes to pink, red, or brown. Any of the following symptoms occur over the next 10 days:  You have a temperature by mouth above 102 F (38.9 C), not controlled by medicine.  Shortness of breath.  Weakness after normal activity.  The white part of the eye turns yellow (jaundice).  You have a decrease in the amount of urine or are urinating less often.  Your urine turns a dark color or changes to pink, red, or brown. Document Released: 11/16/2000 Document Revised: 02/11/2012 Document Reviewed: 07/05/2008 Exit[employer] Patient Information 2014 Exit[employer], [place].  _______________________________________________________________________   CLEAR LIQUID DIET   Foods Allowed                                                                     Foods Excluded  Coffee and tea, regular and decaf                             liquids that you cannot  Plain Jell-O in any flavor                                             see  through such as: Fruit ices (not with fruit pulp)                                     milk, soups, orange juice  Iced Popsicles                                    All solid food Carbonated beverages, regular and diet  Cranberry, grape and apple juices Sports drinks like Gatorade Lightly seasoned clear broth or consume(fat free) Sugar, honey syrup  Sample Menu Breakfast                                Lunch                                     Supper Cranberry juice                    Beef broth                            Chicken broth Jell-O                                     Grape juice                           Apple juice Coffee or tea                        Jell-O                                      Popsicle                                                Coffee or tea                        Coffee or tea  _____________________________________________________________________

## 2017-06-07 ENCOUNTER — Encounter (HOSPITAL_COMMUNITY)
Admission: RE | Admit: 2017-06-07 | Discharge: 2017-06-07 | Disposition: A | Discharge status: Home / Self Care | Payer: PPO | Source: Ambulatory Visit | Attending: Surgery | Admitting: Surgery

## 2017-06-07 ENCOUNTER — Encounter (HOSPITAL_COMMUNITY): Payer: Self-pay

## 2017-06-07 DIAGNOSIS — Z0183 Encounter for blood typing: Secondary | ICD-10-CM | POA: Insufficient documentation

## 2017-06-07 DIAGNOSIS — C182 Malignant neoplasm of ascending colon: Secondary | ICD-10-CM | POA: Diagnosis not present

## 2017-06-07 DIAGNOSIS — Z01812 Encounter for preprocedural laboratory examination: Secondary | ICD-10-CM | POA: Insufficient documentation

## 2017-06-07 HISTORY — DX: Dyspnea, unspecified: R06.00

## 2017-06-07 HISTORY — DX: Personal history of urinary calculi: Z87.442

## 2017-06-07 HISTORY — DX: Gastro-esophageal reflux disease without esophagitis: K21.9

## 2017-06-07 LAB — BASIC METABOLIC PANEL
Anion gap: 5 (ref 5–15)
BUN: 22 mg/dL — AB (ref 6–20)
CALCIUM: 9.4 mg/dL (ref 8.9–10.3)
CO2: 25 mmol/L (ref 22–32)
Chloride: 109 mmol/L (ref 101–111)
Creatinine, Ser: 1.12 mg/dL (ref 0.61–1.24)
GFR calc Af Amer: >60 mL/min (ref >60)
GLUCOSE: 105 mg/dL — AB (ref 65–99)
Potassium: 4.7 mmol/L (ref 3.5–5.1)
Sodium: 139 mmol/L (ref 135–145)

## 2017-06-07 LAB — ABO/RH: ABO/RH(D): O POS

## 2017-06-07 LAB — CBC
HCT: 34.2 % — ABNORMAL LOW (ref 39.0–52.0)
Hemoglobin: 11 g/dL — ABNORMAL LOW (ref 13.0–17.0)
MCH: 25.4 pg — ABNORMAL LOW (ref 26.0–34.0)
MCHC: 32.2 g/dL (ref 30.0–36.0)
MCV: 79 fL (ref 78.0–100.0)
Platelets: 240 10*3/uL (ref 150–400)
RBC: 4.33 MIL/uL (ref 4.22–5.81)
RDW: 14.8 % (ref 11.5–15.5)
WBC: 6.4 10*3/uL (ref 4.0–10.5)

## 2017-06-07 NOTE — Progress Notes (Signed)
ekg 04/15/17 epic CT chest 04/25/17 epic

## 2017-06-08 LAB — HEMOGLOBIN A1C
Hgb A1c MFr Bld: 5.5 % (ref 4.8–5.6)
Mean Plasma Glucose: 111 mg/dL

## 2017-06-08 LAB — CEA: CEA: 1.7 ng/mL (ref 0.0–4.7)

## 2017-06-11 NOTE — H&P (Signed)
Kevin Mathews  Location: [facility] Patient #: [id number] DOB: 08/05/1941 Married / Language: English / Race: White Male   History of Present Illness  Patient words: New-Colon CA.  The patient is a 76 year old male who presents with colorectal cancer. This gentleman was referred by Dr. Lavone Orn after the recent finding of a right colon cancer on screening colonoscopy. He is a symptomatic from this. His last endoscopy was 10 years ago. There is no family history of colon cancer. He has had no blood in his bowel movements. He is otherwise without complaints.   Past Surgical History Colon Polyp Removal - Colonoscopy  Shoulder Surgery  Left. Vasectomy   Diagnostic Studies History [name], CMA;  Colonoscopy  within last year  Allergies  No Known Drug Allergies   Medication History  Atorvastatin Calcium (10MG  Tablet, Oral) Active. Doxazosin Mesylate (4MG  Tablet, Oral) Active. Finasteride (5MG  Tablet, Oral) Active. HydroCHLOROthiazide (12.5MG  Capsule, Oral) Active. Multivitamin Adult (Oral) Active. Vitamin C (500MG  Capsule, Oral) Active. Aspirin (81MG  Tablet, Oral) Active. B Complete (Oral) Active. Krill Oil (1000MG  Capsule, Oral) Active. Medications Reconciled  Social History  Alcohol use  Remotely quit alcohol use. Caffeine use  Tea. No drug use   Family History Arthritis  Mother. Hypertension  Father.  Other Problems  Colon Cancer  Gastroesophageal Reflux Disease  High blood pressure  Hypercholesterolemia     Review of Systems   General Present- Fatigue. Not Present- Appetite Loss, Chills, Fever, Night Sweats, Weight Gain and Weight Loss. Skin Not Present- Change in Wart/Mole, Dryness, Hives, Jaundice, New Lesions, Non-Healing Wounds, Rash and Ulcer. HEENT Present- Hearing Loss and Seasonal Allergies. Not Present- Earache, Hoarseness, Nose Bleed, Oral Ulcers, Ringing in the Ears, Sinus Pain, Sore Throat, Visual  Disturbances, Wears glasses/contact lenses and Yellow Eyes. Respiratory Not Present- Bloody sputum, Chronic Cough, Difficulty Breathing, Snoring and Wheezing. Breast Not Present- Breast Mass, Breast Pain, Nipple Discharge and Skin Changes. Cardiovascular Not Present- Chest Pain, Difficulty Breathing Lying Down, Leg Cramps, Palpitations, Rapid Heart Rate, Shortness of Breath and Swelling of Extremities. Gastrointestinal Present- Constipation and Excessive gas. Not Present- Abdominal Pain, Bloating, Bloody Stool, Change in Bowel Habits, Chronic diarrhea, Difficulty Swallowing, Gets full quickly at meals, Hemorrhoids, Indigestion, Nausea, Rectal Pain and Vomiting. Male Genitourinary Present- Urgency. Not Present- Blood in Urine, Change in Urinary Stream, Frequency, Impotence, Nocturia, Painful Urination and Urine Leakage. Neurological Present- Headaches. Not Present- Decreased Memory, Fainting, Numbness, Seizures, Tingling, Tremor, Trouble walking and Weakness. Psychiatric Not Present- Anxiety, Bipolar, Change in Sleep Pattern, Depression, Fearful and Frequent crying. Endocrine Not Present- Cold Intolerance, Excessive Hunger, Hair Changes, Heat Intolerance, Hot flashes and New Diabetes. Hematology Present- Blood Thinners. Not Present- Easy Bruising, Excessive bleeding, Gland problems, HIV and Persistent Infections.  Vitals   Weight: 183 lb Height: 67in Body Surface Area: 1.95 m Body Mass Index: 28.66 kg/m  Temp.: 98.32F(Oral)  Pulse: 71 (Regular)  BP: 114/72 (Sitting, Left Arm, Standard)    Physical Exam  General Mental Status-Alert. General Appearance-Consistent with stated age. Hydration-Well hydrated. Voice-Normal.  Head and Neck Head-normocephalic, atraumatic with no lesions or palpable masses. Trachea-midline. Thyroid Gland Characteristics - normal size and consistency.  Eye Eyeball - Bilateral-Extraocular movements intact. Sclera/Conjunctiva -  Bilateral-No scleral icterus.  Chest and Lung Exam Chest and lung exam reveals -quiet, even and easy respiratory effort with no use of accessory muscles and on auscultation, normal breath sounds, no adventitious sounds and normal vocal resonance. Inspection Chest Wall - Normal. Back - normal.  Breast -  Did not examine.  Cardiovascular Cardiovascular examination reveals -normal heart sounds, regular rate and rhythm with no murmurs and normal pedal pulses bilaterally.  Abdomen Inspection Inspection of the abdomen reveals - No Hernias. Skin - Scar - no surgical scars. Palpation/Percussion Palpation and Percussion of the abdomen reveal - Soft, Non Tender, No Rebound tenderness, No Rigidity (guarding) and No hepatosplenomegaly. Auscultation Auscultation of the abdomen reveals - Bowel sounds normal.  Neurologic - Did not examine.  Musculoskeletal - Did not examine.  Lymphatic Head & Neck  General Head & Neck Lymphatics: Bilateral - Description - Normal. Axillary  General Axillary Region: Bilateral - Description - Normal. Tenderness - Non Tender. Femoral & Inguinal  Generalized Femoral & Inguinal Lymphatics: Bilateral - Description - Normal. Tenderness - Non Tender.   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 He has had a CT scan of the chest and abdomen which is fairly unremarkable. There was a tiny 4 mm lesion in the liver and I suspect is a cyst. Laparoscopic assisted partial colectomy has been recommended. I discussed this with the patient and his family in detail. I gave him literature regarding surgery. I discussed the risk of surgery which includes but is not limited to bleeding, infection, anastomotic problems, injury to surrounding structures, cardiopulmonary issues, postoperative recovery, etc. He understands and agrees to proceed with surgery  which will be scheduled

## 2017-06-12 ENCOUNTER — Inpatient Hospital Stay (HOSPITAL_COMMUNITY): Payer: PPO | Admitting: Anesthesiology

## 2017-06-12 ENCOUNTER — Encounter (HOSPITAL_COMMUNITY): Payer: Self-pay | Admitting: *Deleted

## 2017-06-12 ENCOUNTER — Encounter (HOSPITAL_COMMUNITY)
Admission: RE | Disposition: A | Discharge status: Home / Self Care | Payer: Self-pay | Source: Ambulatory Visit | Attending: Surgery

## 2017-06-12 ENCOUNTER — Inpatient Hospital Stay (HOSPITAL_COMMUNITY)
Admission: RE | Admit: 2017-06-12 | Discharge: 2017-06-16 | DRG: 331 | Disposition: A | Discharge status: Home / Self Care | Payer: PPO | Source: Ambulatory Visit | Attending: Surgery | Admitting: Surgery

## 2017-06-12 DIAGNOSIS — M1990 Unspecified osteoarthritis, unspecified site: Secondary | ICD-10-CM | POA: Diagnosis present

## 2017-06-12 DIAGNOSIS — K219 Gastro-esophageal reflux disease without esophagitis: Secondary | ICD-10-CM | POA: Diagnosis not present

## 2017-06-12 DIAGNOSIS — Z87891 Personal history of nicotine dependence: Secondary | ICD-10-CM | POA: Diagnosis not present

## 2017-06-12 DIAGNOSIS — M19012 Primary osteoarthritis, left shoulder: Secondary | ICD-10-CM | POA: Diagnosis not present

## 2017-06-12 DIAGNOSIS — I10 Essential (primary) hypertension: Secondary | ICD-10-CM | POA: Diagnosis present

## 2017-06-12 DIAGNOSIS — C180 Malignant neoplasm of cecum: Secondary | ICD-10-CM | POA: Diagnosis not present

## 2017-06-12 DIAGNOSIS — C182 Malignant neoplasm of ascending colon: Secondary | ICD-10-CM | POA: Diagnosis not present

## 2017-06-12 DIAGNOSIS — Z86010 Personal history of colonic polyps: Secondary | ICD-10-CM | POA: Diagnosis not present

## 2017-06-12 DIAGNOSIS — Z800 Family history of malignant neoplasm of digestive organs: Secondary | ICD-10-CM | POA: Diagnosis not present

## 2017-06-12 DIAGNOSIS — Z8249 Family history of ischemic heart disease and other diseases of the circulatory system: Secondary | ICD-10-CM

## 2017-06-12 HISTORY — DX: Malignant neoplasm of ascending colon: C18.2

## 2017-06-12 HISTORY — PX: LAPAROSCOPIC PARTIAL COLECTOMY: SHX5907

## 2017-06-12 LAB — TYPE AND SCREEN
ABO/RH(D): O POS
ANTIBODY SCREEN: NEGATIVE

## 2017-06-12 SURGERY — LAPAROSCOPIC PARTIAL COLECTOMY
Anesthesia: General | Site: Abdomen

## 2017-06-12 MED ORDER — ROCURONIUM BROMIDE 100 MG/10ML IV SOLN
INTRAVENOUS | Status: DC | PRN
  Administered 2017-06-12: 20 mg via INTRAVENOUS
  Administered 2017-06-12: 50 mg via INTRAVENOUS

## 2017-06-12 MED ORDER — BUPIVACAINE-EPINEPHRINE 0.25% -1:200000 IJ SOLN
INTRAMUSCULAR | Status: DC | PRN
  Administered 2017-06-12: 20 mL

## 2017-06-12 MED ORDER — LACTATED RINGERS IV SOLN
INTRAVENOUS | Status: DC
  Administered 2017-06-12 – 2017-06-15 (×5): via INTRAVENOUS

## 2017-06-12 MED ORDER — FINASTERIDE 5 MG PO TABS
5.0000 mg | ORAL_TABLET | Freq: Every evening | ORAL | Status: DC
  Administered 2017-06-12 – 2017-06-15 (×4): 5 mg via ORAL
  Filled 2017-06-12 (×4): qty 1

## 2017-06-12 MED ORDER — DIPHENHYDRAMINE HCL 12.5 MG/5ML PO ELIX: 12.5000 mg | ORAL_SOLUTION | Freq: Four times a day (QID) | ORAL | Status: DC | PRN

## 2017-06-12 MED ORDER — PROMETHAZINE HCL 25 MG/ML IJ SOLN
6.2500 mg | INTRAMUSCULAR | Status: AC | PRN
  Administered 2017-06-12 (×2): 12.5 mg via INTRAVENOUS

## 2017-06-12 MED ORDER — ALVIMOPAN 12 MG PO CAPS
12.0000 mg | ORAL_CAPSULE | Freq: Two times a day (BID) | ORAL | Status: DC
  Administered 2017-06-13 – 2017-06-16 (×7): 12 mg via ORAL
  Filled 2017-06-12 (×7): qty 1

## 2017-06-12 MED ORDER — SUGAMMADEX SODIUM 200 MG/2ML IV SOLN
INTRAVENOUS | Status: DC | PRN
  Administered 2017-06-12: 166 mg via INTRAVENOUS

## 2017-06-12 MED ORDER — OXYCODONE HCL 5 MG/5ML PO SOLN
5.0000 mg | Freq: Once | ORAL | Status: DC | PRN
  Filled 2017-06-12: qty 5

## 2017-06-12 MED ORDER — EPHEDRINE SULFATE 50 MG/ML IJ SOLN
INTRAMUSCULAR | Status: DC | PRN
  Administered 2017-06-12 (×2): 10 mg via INTRAVENOUS

## 2017-06-12 MED ORDER — PROPOFOL 10 MG/ML IV BOLUS
INTRAVENOUS | Status: DC | PRN
  Administered 2017-06-12: 150 mg via INTRAVENOUS

## 2017-06-12 MED ORDER — EPHEDRINE 5 MG/ML INJ
INTRAVENOUS | Status: AC
  Filled 2017-06-12: qty 10

## 2017-06-12 MED ORDER — CHLORHEXIDINE GLUCONATE CLOTH 2 % EX PADS: 6.0000 | MEDICATED_PAD | Freq: Once | CUTANEOUS | Status: DC

## 2017-06-12 MED ORDER — BUPIVACAINE-EPINEPHRINE (PF) 0.25% -1:200000 IJ SOLN
INTRAMUSCULAR | Status: AC
  Filled 2017-06-12: qty 30

## 2017-06-12 MED ORDER — HYDROMORPHONE HCL-NACL 0.5-0.9 MG/ML-% IV SOSY
PREFILLED_SYRINGE | INTRAVENOUS | Status: AC
  Filled 2017-06-12: qty 2

## 2017-06-12 MED ORDER — LIDOCAINE HCL (CARDIAC) 20 MG/ML IV SOLN
INTRAVENOUS | Status: DC | PRN
  Administered 2017-06-12: 50 mg via INTRATRACHEAL

## 2017-06-12 MED ORDER — OXYCODONE HCL 5 MG PO TABS: 5.0000 mg | ORAL_TABLET | Freq: Once | ORAL | Status: DC | PRN

## 2017-06-12 MED ORDER — MORPHINE SULFATE 2 MG/ML IV SOLN
INTRAVENOUS | Status: DC
  Administered 2017-06-12: 3 mg via INTRAVENOUS
  Administered 2017-06-12: 0 mg via INTRAVENOUS
  Administered 2017-06-12: 13:00:00 via INTRAVENOUS
  Administered 2017-06-13: 9 mg via INTRAVENOUS
  Administered 2017-06-13 (×2): 0 mg via INTRAVENOUS
  Administered 2017-06-13: 1.5 mg via INTRAVENOUS
  Administered 2017-06-13: 1.4 mg via INTRAVENOUS
  Administered 2017-06-13: 13.5 mg via INTRAVENOUS
  Administered 2017-06-14: 6 mg via INTRAVENOUS
  Administered 2017-06-14: 2 mg via INTRAVENOUS
  Administered 2017-06-14: 5 mg via INTRAVENOUS
  Administered 2017-06-14: 6 mg via INTRAVENOUS
  Filled 2017-06-12: qty 25
  Filled 2017-06-12: qty 30
  Filled 2017-06-12: qty 25

## 2017-06-12 MED ORDER — LACTATED RINGERS IV SOLN
INTRAVENOUS | Status: DC
  Administered 2017-06-13: 13:00:00 via INTRAVENOUS
  Administered 2017-06-14: 1000 mL via INTRAVENOUS

## 2017-06-12 MED ORDER — ONDANSETRON HCL 4 MG/2ML IJ SOLN: 4.0000 mg | Freq: Four times a day (QID) | INTRAMUSCULAR | Status: DC | PRN

## 2017-06-12 MED ORDER — HYDROMORPHONE HCL-NACL 0.5-0.9 MG/ML-% IV SOSY
0.2500 mg | PREFILLED_SYRINGE | INTRAVENOUS | Status: DC | PRN
  Administered 2017-06-12 (×4): 0.5 mg via INTRAVENOUS

## 2017-06-12 MED ORDER — PROPOFOL 10 MG/ML IV BOLUS
INTRAVENOUS | Status: AC
  Filled 2017-06-12: qty 20

## 2017-06-12 MED ORDER — MEPERIDINE HCL 50 MG/ML IJ SOLN: 6.2500 mg | INTRAMUSCULAR | Status: DC | PRN

## 2017-06-12 MED ORDER — SODIUM CHLORIDE 0.9 % IR SOLN
Status: DC | PRN
  Administered 2017-06-12: 2000 mL

## 2017-06-12 MED ORDER — ENOXAPARIN SODIUM 40 MG/0.4ML ~~LOC~~ SOLN
40.0000 mg | SUBCUTANEOUS | Status: DC
  Administered 2017-06-13 – 2017-06-16 (×4): 40 mg via SUBCUTANEOUS
  Filled 2017-06-12 (×4): qty 0.4

## 2017-06-12 MED ORDER — SUCCINYLCHOLINE CHLORIDE 200 MG/10ML IV SOSY
PREFILLED_SYRINGE | INTRAVENOUS | Status: AC
  Filled 2017-06-12: qty 10

## 2017-06-12 MED ORDER — NALOXONE HCL 0.4 MG/ML IJ SOLN: 0.4000 mg | INTRAMUSCULAR | Status: DC | PRN

## 2017-06-12 MED ORDER — PROMETHAZINE HCL 25 MG/ML IJ SOLN
INTRAMUSCULAR | Status: AC
  Filled 2017-06-12: qty 1

## 2017-06-12 MED ORDER — ONDANSETRON HCL 4 MG/2ML IJ SOLN
INTRAMUSCULAR | Status: DC | PRN
  Administered 2017-06-12: 4 mg via INTRAVENOUS

## 2017-06-12 MED ORDER — HYDROCHLOROTHIAZIDE 12.5 MG PO CAPS
12.5000 mg | ORAL_CAPSULE | Freq: Every evening | ORAL | Status: DC
  Administered 2017-06-12 – 2017-06-15 (×4): 12.5 mg via ORAL
  Filled 2017-06-12 (×4): qty 1

## 2017-06-12 MED ORDER — ALVIMOPAN 12 MG PO CAPS
12.0000 mg | ORAL_CAPSULE | ORAL | Status: AC
  Administered 2017-06-12: 12 mg via ORAL
  Filled 2017-06-12: qty 1

## 2017-06-12 MED ORDER — DEXTROSE 5 % IV SOLN
2.0000 g | INTRAVENOUS | Status: AC
  Administered 2017-06-12: 2 g via INTRAVENOUS
  Filled 2017-06-12 (×2): qty 2

## 2017-06-12 MED ORDER — ROCURONIUM BROMIDE 50 MG/5ML IV SOSY
PREFILLED_SYRINGE | INTRAVENOUS | Status: AC
  Filled 2017-06-12: qty 5

## 2017-06-12 MED ORDER — FENTANYL CITRATE (PF) 250 MCG/5ML IJ SOLN
INTRAMUSCULAR | Status: AC
  Filled 2017-06-12: qty 5

## 2017-06-12 MED ORDER — ONDANSETRON HCL 4 MG/2ML IJ SOLN
INTRAMUSCULAR | Status: AC
  Filled 2017-06-12: qty 2

## 2017-06-12 MED ORDER — DIPHENHYDRAMINE HCL 50 MG/ML IJ SOLN: 12.5000 mg | Freq: Four times a day (QID) | INTRAMUSCULAR | Status: DC | PRN

## 2017-06-12 MED ORDER — SODIUM CHLORIDE 0.9% FLUSH: 9.0000 mL | INTRAVENOUS | Status: DC | PRN

## 2017-06-12 MED ORDER — SUGAMMADEX SODIUM 200 MG/2ML IV SOLN
INTRAVENOUS | Status: AC
  Filled 2017-06-12: qty 2

## 2017-06-12 MED ORDER — LIDOCAINE 2% (20 MG/ML) 5 ML SYRINGE
INTRAMUSCULAR | Status: AC
  Filled 2017-06-12: qty 5

## 2017-06-12 MED ORDER — FENTANYL CITRATE (PF) 250 MCG/5ML IJ SOLN
INTRAMUSCULAR | Status: DC | PRN
  Administered 2017-06-12: 50 ug via INTRAVENOUS
  Administered 2017-06-12 (×2): 100 ug via INTRAVENOUS

## 2017-06-12 MED ORDER — RINGERS IRRIGATION IR SOLN
Status: DC | PRN
  Administered 2017-06-12: 1000 mL

## 2017-06-12 MED ORDER — DOXAZOSIN MESYLATE 4 MG PO TABS
4.0000 mg | ORAL_TABLET | Freq: Every evening | ORAL | Status: DC
  Administered 2017-06-12 – 2017-06-15 (×4): 4 mg via ORAL
  Filled 2017-06-12: qty 1
  Filled 2017-06-12: qty 2
  Filled 2017-06-12: qty 1
  Filled 2017-06-12 (×4): qty 2
  Filled 2017-06-12 (×3): qty 1

## 2017-06-12 MED ORDER — ONDANSETRON HCL 4 MG PO TABS: 4.0000 mg | ORAL_TABLET | Freq: Four times a day (QID) | ORAL | Status: DC | PRN

## 2017-06-12 SURGICAL SUPPLY — 70 items
APPLIER CLIP 5 13 M/L LIGAMAX5 (MISCELLANEOUS)
APPLIER CLIP ROT 10 11.4 M/L (STAPLE)
BLADE EXTENDED COATED 6.5IN (ELECTRODE) ×2 IMPLANT
CABLE HIGH FREQUENCY MONO STRZ (ELECTRODE) ×2 IMPLANT
CELLS DAT CNTRL 66122 CELL SVR (MISCELLANEOUS) IMPLANT
CHLORAPREP W/TINT 26ML (MISCELLANEOUS) ×2 IMPLANT
CLIP APPLIE 5 13 M/L LIGAMAX5 (MISCELLANEOUS) IMPLANT
CLIP APPLIE ROT 10 11.4 M/L (STAPLE) IMPLANT
DECANTER SPIKE VIAL GLASS SM (MISCELLANEOUS) ×2 IMPLANT
DERMABOND ADVANCED (GAUZE/BANDAGES/DRESSINGS) ×1
DERMABOND ADVANCED .7 DNX12 (GAUZE/BANDAGES/DRESSINGS) ×1 IMPLANT
DRAIN CHANNEL 19F RND (DRAIN) IMPLANT
DRAPE LAPAROSCOPIC ABDOMINAL (DRAPES) ×2 IMPLANT
DRAPE UTILITY XL STRL (DRAPES) ×2 IMPLANT
DRSG OPSITE POSTOP 4X10 (GAUZE/BANDAGES/DRESSINGS) ×2 IMPLANT
DRSG OPSITE POSTOP 4X6 (GAUZE/BANDAGES/DRESSINGS) ×2 IMPLANT
DRSG OPSITE POSTOP 4X8 (GAUZE/BANDAGES/DRESSINGS) IMPLANT
ELECT PENCIL ROCKER SW 15FT (MISCELLANEOUS) ×4 IMPLANT
ELECT REM PT RETURN 15FT ADLT (MISCELLANEOUS) ×2 IMPLANT
EVACUATOR DRAINAGE 10X20 100CC (DRAIN) IMPLANT
EVACUATOR SILICONE 100CC (DRAIN)
GAUZE SPONGE 4X4 12PLY STRL (GAUZE/BANDAGES/DRESSINGS) ×2 IMPLANT
GLOVE SURG SIGNA 7.5 PF LTX (GLOVE) ×4 IMPLANT
GOWN STRL REUS W/TWL XL LVL3 (GOWN DISPOSABLE) ×12 IMPLANT
HANDLE SUCTION POOLE (INSTRUMENTS) ×1 IMPLANT
IRRIG SUCT STRYKERFLOW 2 WTIP (MISCELLANEOUS) ×2
IRRIGATION SUCT STRKRFLW 2 WTP (MISCELLANEOUS) ×1 IMPLANT
LEGGING LITHOTOMY PAIR STRL (DRAPES) ×2 IMPLANT
LIGASURE IMPACT 36 18CM CVD LR (INSTRUMENTS) ×2 IMPLANT
LUBRICANT JELLY K Y 4OZ (MISCELLANEOUS) IMPLANT
PACK COLON (CUSTOM PROCEDURE TRAY) ×2 IMPLANT
PACK GENERAL/GYN (CUSTOM PROCEDURE TRAY) ×2 IMPLANT
PAD POSITIONING PINK XL (MISCELLANEOUS) IMPLANT
POSITIONER SURGICAL ARM (MISCELLANEOUS) IMPLANT
RELOAD PROXIMATE 75MM BLUE (ENDOMECHANICALS) ×4 IMPLANT
RTRCTR WOUND ALEXIS 18CM MED (MISCELLANEOUS)
SCISSORS LAP 5X35 DISP (ENDOMECHANICALS) ×2 IMPLANT
SHEARS HARMONIC ACE PLUS 36CM (ENDOMECHANICALS) ×2 IMPLANT
SLEEVE XCEL OPT CAN 5 100 (ENDOMECHANICALS) ×4 IMPLANT
SOLUTION ANTI FOG 6CC (MISCELLANEOUS) ×2 IMPLANT
SPONGE LAP 18X18 X RAY DECT (DISPOSABLE) IMPLANT
STAPLER GUN LINEAR PROX 60 (STAPLE) ×2 IMPLANT
STAPLER PROXIMATE 75MM BLUE (STAPLE) ×2 IMPLANT
STAPLER VISISTAT 35W (STAPLE) ×2 IMPLANT
SUCTION POOLE HANDLE (INSTRUMENTS) ×2
SUT ETHILON 2 0 PS N (SUTURE) ×2 IMPLANT
SUT MNCRL AB 4-0 PS2 18 (SUTURE) ×2 IMPLANT
SUT PDS AB 1 CTX 36 (SUTURE) ×4 IMPLANT
SUT PDS AB 1 TP1 96 (SUTURE) IMPLANT
SUT PROLENE 2 0 KS (SUTURE) IMPLANT
SUT SILK 2 0 (SUTURE) ×1
SUT SILK 2 0 SH CR/8 (SUTURE) ×2 IMPLANT
SUT SILK 2-0 18XBRD TIE 12 (SUTURE) ×1 IMPLANT
SUT SILK 3 0 (SUTURE) ×1
SUT SILK 3 0 SH CR/8 (SUTURE) ×4 IMPLANT
SUT SILK 3-0 18XBRD TIE 12 (SUTURE) ×1 IMPLANT
SUT VIC AB 2-0 CT1 36 (SUTURE) ×2 IMPLANT
SUT VIC AB 2-0 SH 18 (SUTURE) IMPLANT
SUT VICRYL 0 UR6 27IN ABS (SUTURE) ×2 IMPLANT
SUT VICRYL 2 0 18  UND BR (SUTURE)
SUT VICRYL 2 0 18 UND BR (SUTURE) IMPLANT
SYS LAPSCP GELPORT 120MM (MISCELLANEOUS)
SYSTEM LAPSCP GELPORT 120MM (MISCELLANEOUS) IMPLANT
TAPE CLOTH 4X10 WHT NS (GAUZE/BANDAGES/DRESSINGS) IMPLANT
TOWEL OR NON WOVEN STRL DISP B (DISPOSABLE) ×4 IMPLANT
TRAY FOLEY W/METER SILVER 16FR (SET/KITS/TRAYS/PACK) ×2 IMPLANT
TROCAR XCEL BLUNT TIP 100MML (ENDOMECHANICALS) IMPLANT
TROCAR XCEL NON-BLD 11X100MML (ENDOMECHANICALS) IMPLANT
TUBING CONNECTING 10 (TUBING) IMPLANT
TUBING INSUF HEATED (TUBING) ×2 IMPLANT

## 2017-06-12 NOTE — Op Note (Signed)
LAPAROSCOPIC ASSISTED PARTIAL COLECTOMY  Procedure Note  Kevin Kevin Mathews 06/12/2017   Pre-op Diagnosis: ascending colon cancer     Post-op Diagnosis: same  Procedure(s): LAPAROSCOPIC ASSISTED PARTIAL ILEOCOLECTOMY  Surgeon(s): Coralie Keens, MD  Anesthesia: General  Staff:  Circulator: Ignacia Palma, RN; Laurette Schimke, RN Relief Circulator: Maren Reamer, RN Scrub Person: Colin Ina, RN; Gregor Hams, CST; Kriste Basque  Estimated Blood Loss: Minimal               Specimens: SENT TO PATH  Indications:  This is Kevin Mathews 76 -year-old gentleman who was recently diagnosed with an adenocarcinoma in the right colon on screening colonoscopy. History of his colonoscopy and continues prior. Preoperative CAT scan of the abdomen pelvis is unremarkable. Preop CEA was normal. The decisions was made to proceed with laparoscopic assisted partial colectomy  Findings: The mass was easily identify with the previous endoscopic tattooing. No other intra-abdominal pathology was identified.  Procedure: The patient was brought to the operating room and identified as the correct patient. He was placed upon the operating table and general anesthesia was induced. His abdomen was prepped and draped in the usual sterile fashion. I made Kevin Mathews small vertical incision just below the umbilicus with Kevin Mathews scalpel. I took this down to the fascia which was opened scalpel as well. Kevin Mathews hemostat was then used to pass into the perineal cavity under great vision. Kevin Mathews 0 Vicryl pushing suture was placed around the fascia opening. The [facility] port was placed to the opening and insufflation of the abdomen was begun. 62 [name] reports then placed the patient's upper midline under direct vision. I could easily identify the cecum and ascending colon. There was Kevin Mathews tattoo where the colonoscopy biopsy was performed indicating the level of the cancer. I saw no other intra-abdominal pathology. The liver appeared normal. I next  mobilized the right colon along the white line of [place] free of the appendix is well with harmonic scalpel. I then took down the hepatic flexure freed gallbladder off of the colon with the Harmonic scalpel as well. The entire right colon became easily mobile and was able to easily move past the midline. At this point I converted to the open portion of procedure. I removed all ports. I created an incision between the 2 five-minute reports with scalpel. I then took this down through the fascia with electrocautery. The peritoneum was then opened entirely to the incision. I was then able to easily eviscerate the cecum and right colon and distal ileum. I transected the distal ileum with the GIA 75 stapler. I then transected the transverse colon proximally with the GIA 75 staplers well. I then took down the mesentery with the LigaSure cautery device. I easily palpate the mass inside the colon. The specimen was sent to pathology for evaluation. I then reapproximated the small bowel to the proximal colon in Kevin Mathews side-to-side fashion with interrupted silk sutures. I performed Kevin Mathews colostomy and enterotomy with the cautery and performed Kevin Mathews side-to-side anastomosis with Kevin Mathews single firing the GIA 75 stapler. The opening was then closed with TA 60 stapler. I evaluated stable on hemostasis appeared to be achieved prior to closing with Kevin Mathews TA 60. I then reinforced stapler with interrupted silk sutures and closed the mesenteric defect silk sutures as well. Again the anastomosis appeared pink and well-perfused.  I then placed colon back into the abdomen. I then irrigated the abdomen with several liters normal saline. Hemostasis again appeared to be achieved. At this  point the wound protector was removed. We changed our gowns and gloves were replaced all the sterile drapes. I then closed the patient midline fascia with Kevin Mathews running #1 PDS suture. I tied off the 0 Vicryl then lysed causing the small fascial defect there as well. I then irrigated  incisions with saline. An S ties with Marcaine. I then closed both incisions running 4-0 Monocryl sutures. Dermabond was then applied. The patient tolerated the procedure well. All counts were correct at the end of the procedure.  Kevin Mathews dressing was placed on midline incision. The patient was then extubated in the operating room and taken in Kevin Mathews stable condition to the recovery room.           Kevin Kevin Mathews   Date: 06/12/2017  Time: 11:38 AM

## 2017-06-12 NOTE — Anesthesia Preprocedure Evaluation (Signed)
Anesthesia Evaluation  Patient identified by MRN, date of birth, ID band Patient awake    Reviewed: Allergy & Precautions, NPO status , Patient's Chart, lab work & pertinent test results  Airway Mallampati: III  TM Distance: >3 FB Neck ROM: Full    Dental no notable dental hx.    Pulmonary neg pulmonary ROS, former smoker,    Pulmonary exam normal breath sounds clear to auscultation       Cardiovascular Exercise Tolerance: Good hypertension, Pt. on medications Normal cardiovascular exam Rhythm:Regular Rate:Normal     Neuro/Psych negative neurological ROS  negative psych ROS   GI/Hepatic negative GI ROS, Neg liver ROS, GERD  ,  Endo/Other  negative endocrine ROS  Renal/GU negative Renal ROS  negative genitourinary   Musculoskeletal negative musculoskeletal ROS (+) Arthritis ,   Abdominal   Peds negative pediatric ROS (+)  Hematology negative hematology ROS (+)   Anesthesia Other Findings Colon Cancer  Reproductive/Obstetrics negative OB ROS                             Anesthesia Physical  Anesthesia Plan  ASA: III  Anesthesia Plan: General   Post-op Pain Management:    Induction: Intravenous  PONV Risk Score and Plan: 4 or greater and Ondansetron, Dexamethasone, Propofol, Midazolam and Treatment may vary due to age or medical condition  Airway Management Planned: Oral ETT  Additional Equipment:   Intra-op Plan:   Post-operative Plan: Extubation in OR  Informed Consent: I have reviewed the patients History and Physical, chart, labs and discussed the procedure including the risks, benefits and alternatives for the proposed anesthesia with the patient or authorized representative who has indicated his/her understanding and acceptance.   Dental advisory given  Plan Discussed with: CRNA and Surgeon  Anesthesia Plan Comments:         Anesthesia Quick Evaluation

## 2017-06-12 NOTE — Interval H&P Note (Signed)
History and Physical Interval Note: no change in H and P  06/12/2017 9:19 AM  Kevin Mathews  has presented today for surgery, with the diagnosis of ascending colon cancer  The various methods of treatment have been discussed with the patient and family. After consideration of risks, benefits and other options for treatment, the patient has consented to  Procedure(s): LAPAROSCOPIC ASSISTED PARTIAL COLECTOMY (N/A) as a surgical intervention .  The patient's history has been reviewed, patient examined, no change in status, stable for surgery.  I have reviewed the patient's chart and labs.  Questions were answered to the patient's satisfaction.     Devante Capano A

## 2017-06-12 NOTE — Transfer of Care (Signed)
Immediate Anesthesia Transfer of Care Note  Patient: Kevin Mathews  Procedure(s) Performed: Procedure(s): LAPAROSCOPIC ASSISTED PARTIAL COLECTOMY (N/A)  Patient Location: PACU  Anesthesia Type:General  Level of Consciousness: awake, alert  and oriented  Airway & Oxygen Therapy: Patient Spontanous Breathing and Patient connected to face mask oxygen  Post-op Assessment: Report given to RN and Post -op Vital signs reviewed and stable  Post vital signs: Reviewed and stable  Last Vitals:  Vitals:   06/12/17 0755  BP: 121/79  Pulse: 65  Resp: 16  Temp: 36.7 C    Last Pain:  Vitals:   06/12/17 0854  TempSrc:   PainSc: 0-No pain      Patients Stated Pain Goal: 3 (00/52/59 1028)  Complications: No apparent anesthesia complications

## 2017-06-12 NOTE — Anesthesia Postprocedure Evaluation (Signed)
Anesthesia Post Note  Patient: Kevin Mathews  Procedure(s) Performed: Procedure(s) (LRB): LAPAROSCOPIC ASSISTED PARTIAL COLECTOMY (N/A)     Patient location during evaluation: PACU Anesthesia Type: General Level of consciousness: awake and alert Pain management: pain level controlled Vital Signs Assessment: post-procedure vital signs reviewed and stable Respiratory status: spontaneous breathing, nonlabored ventilation and respiratory function stable Cardiovascular status: blood pressure returned to baseline and stable Postop Assessment: no signs of nausea or vomiting Anesthetic complications: no    Last Vitals:  Vitals:   06/12/17 1300 06/12/17 1315  BP: (!) 154/74 (!) 146/74  Pulse: 67 60  Resp: 16 12  Temp:  36.8 C    Last Pain:  Vitals:   06/12/17 1315  TempSrc:   PainSc: 3                  Lynda Rainwater

## 2017-06-12 NOTE — Anesthesia Procedure Notes (Signed)
Procedure Name: Intubation Date/Time: 06/12/2017 10:06 AM Performed by: [place] Kord Monette C Pre-anesthesia Checklist: Patient identified, Emergency Drugs available, Suction available and Patient being monitored Patient Re-evaluated:Patient Re-evaluated prior to inductionOxygen Delivery Method: Circle system utilized Preoxygenation: Pre-oxygenation with 100% oxygen Intubation Type: IV induction Ventilation: Mask ventilation without difficulty Laryngoscope Size: Mac and 4 Grade View: Grade I Tube type: Oral Tube size: 7.5 mm Number of attempts: 1 Airway Equipment and Method: Stylet and Oral airway Placement Confirmation: ETT inserted through vocal cords under direct vision,  positive ETCO2 and breath sounds checked- equal and bilateral Secured at: 22 cm Tube secured with: Tape Dental Injury: Teeth and Oropharynx as per pre-operative assessment

## 2017-06-13 ENCOUNTER — Encounter (HOSPITAL_COMMUNITY): Payer: Self-pay | Admitting: Surgery

## 2017-06-13 LAB — BASIC METABOLIC PANEL
Anion gap: 9 (ref 5–15)
BUN: 14 mg/dL (ref 6–20)
CHLORIDE: 105 mmol/L (ref 101–111)
CO2: 23 mmol/L (ref 22–32)
CREATININE: 0.99 mg/dL (ref 0.61–1.24)
Calcium: 8.8 mg/dL — ABNORMAL LOW (ref 8.9–10.3)
GFR calc Af Amer: >60 mL/min (ref >60)
GFR calc non Af Amer: >60 mL/min (ref >60)
GLUCOSE: 111 mg/dL — AB (ref 65–99)
Potassium: 3.9 mmol/L (ref 3.5–5.1)
SODIUM: 137 mmol/L (ref 135–145)

## 2017-06-13 LAB — CBC
HCT: 30.1 % — ABNORMAL LOW (ref 39.0–52.0)
Hemoglobin: 9.9 g/dL — ABNORMAL LOW (ref 13.0–17.0)
MCH: 26.1 pg (ref 26.0–34.0)
MCHC: 32.9 g/dL (ref 30.0–36.0)
MCV: 79.2 fL (ref 78.0–100.0)
PLATELETS: 208 10*3/uL (ref 150–400)
RBC: 3.8 MIL/uL — ABNORMAL LOW (ref 4.22–5.81)
RDW: 15.4 % (ref 11.5–15.5)
WBC: 13.6 10*3/uL — AB (ref 4.0–10.5)

## 2017-06-13 NOTE — Progress Notes (Signed)
1 Day Post-Op   Subjective/Chief Complaint: No complaints Comfortable Denies nausea or bloating   Objective: Vital signs in last 24 hours: Temp:  [97.3 F (36.3 C)-98.9 F (37.2 C)] 98.9 F (37.2 C) (07/12 0456) Pulse Rate:  [53-102] 63 (07/12 0456) Resp:  [12-20] 16 (07/12 0901) BP: (97-169)/(62-92) 122/62 (07/12 0456) SpO2:  [97 %-100 %] 98 % (07/12 0901) Last BM Date: 06/12/17 (am)  Intake/Output from previous day: 07/11 0701 - 07/12 0700 In: 2381.3 [I.V.:2381.3] Out: 1430 [Urine:1420; Blood:10] Intake/Output this shift: No intake/output data recorded.  Exam: Looks comfortable Awake and alert Lungs clear Abdomen soft, non-distended, dressing dry  Lab Results:   Recent Labs  06/13/17 0440  WBC 13.6*  HGB 9.9*  HCT 30.1*  PLT 208   BMET  Recent Labs  06/13/17 0440  NA 137  K 3.9  CL 105  CO2 23  GLUCOSE 111*  BUN 14  CREATININE 0.99  CALCIUM 8.8*   PT/INR No results for input(s): LABPROT, INR in the last 72 hours. ABG No results for input(s): PHART, HCO3 in the last 72 hours.  Invalid input(s): PCO2, PO2  Studies/Results: No results found.  Anti-infectives: Anti-infectives    Start     Dose/Rate Route Frequency Ordered Stop   06/12/17 0753  cefoTEtan (CEFOTAN) 2 g in dextrose 5 % 50 mL IVPB     2 g 100 mL/hr over 30 Minutes Intravenous On call to O.R. 06/12/17 0753 06/12/17 1038      Assessment/Plan: s/p Procedure(s): LAPAROSCOPIC ASSISTED PARTIAL COLECTOMY (N/A)  Start clear liquids D/c foley Decrease IVF Ambulate   LOS: 1 day    Cherylyn Sundby A 06/13/2017

## 2017-06-14 MED ORDER — MORPHINE SULFATE (PF) 2 MG/ML IV SOLN: 1.0000 mg | INTRAVENOUS | Status: DC | PRN

## 2017-06-14 MED ORDER — OXYCODONE HCL 5 MG PO TABS: 5.0000 mg | ORAL_TABLET | ORAL | 0 refills | Status: DC | PRN

## 2017-06-14 MED ORDER — OXYCODONE HCL 5 MG PO TABS
5.0000 mg | ORAL_TABLET | ORAL | Status: DC | PRN
  Administered 2017-06-14: 10 mg via ORAL
  Administered 2017-06-14: 5 mg via ORAL
  Administered 2017-06-15 (×2): 10 mg via ORAL
  Filled 2017-06-14: qty 2
  Filled 2017-06-14: qty 1
  Filled 2017-06-14 (×2): qty 2

## 2017-06-14 NOTE — Discharge Instructions (Signed)
[facility]      [facility] [phone]  OPEN ABDOMINAL SURGERY: POST OP INSTRUCTIONS  Always review your discharge instruction sheet given to you by the facility where your surgery was performed.  IF YOU HAVE DISABILITY OR FAMILY LEAVE FORMS, YOU MUST BRING THEM TO THE OFFICE FOR PROCESSING.  PLEASE DO NOT GIVE THEM TO YOUR DOCTOR.  1. A prescription for pain medication may be given to you upon discharge.  Take your pain medication as prescribed, if needed.  If narcotic pain medicine is not needed, then you may take acetaminophen (Tylenol) or ibuprofen (Advil) as needed. 2. Take your usually prescribed medications unless otherwise directed. 3. If you need a refill on your pain medication, please contact your pharmacy. They will contact our office to request authorization.  Prescriptions will not be filled after 5pm or on week-ends. 4. You should follow a light diet the first few days after arrival home, such as soup and crackers, pudding, etc.unless your doctor has advised otherwise. A high-fiber, low fat diet can be resumed as tolerated.   Be sure to include lots of fluids daily. Most patients will experience some swelling and bruising on the chest and neck area.  Ice packs will help.  Swelling and bruising can take several days to resolve 5. Most patients will experience some swelling and bruising in the area of the incision. Ice pack will help. Swelling and bruising can take several days to resolve..  6. It is common to experience some constipation if taking pain medication after surgery.  Increasing fluid intake and taking a stool softener will usually help or prevent this problem from occurring.  A mild laxative (Milk of Magnesia or Miralax) should be taken according to package directions if there are no bowel movements after 48 hours. 7.  You may have steri-strips (small skin tapes) in place directly over the incision.  These strips should be left on the skin for 7-10 days.  If your  surgeon used skin glue on the incision, you may shower in 24 hours.  The glue will flake off over the next 2-3 weeks.  Any sutures or staples will be removed at the office during your follow-up visit. You may find that a light gauze bandage over your incision may keep your staples from being rubbed or pulled. You may shower and replace the bandage daily. 8. ACTIVITIES:  You may resume regular (light) daily activities beginning the next day--such as daily self-care, walking, climbing stairs--gradually increasing activities as tolerated.  You may have sexual intercourse when it is comfortable.  Refrain from any heavy lifting or straining until approved by your doctor. a. You may drive when you no longer are taking prescription pain medication, you can comfortably wear a seatbelt, and you can safely maneuver your car and apply brakes b. Return to Work: ___________________________________ 9. You should see your doctor in the office for a follow-up appointment approximately two weeks after your surgery.  Make sure that you call for this appointment within a day or two after you arrive home to insure a convenient appointment time. OTHER INSTRUCTIONS:  _____________________________________________________________ _____________________________________________________________  WHEN TO CALL YOUR DOCTOR: 1. Fever over 101.0 2. Inability to urinate 3. Nausea and/or vomiting 4. Extreme swelling or bruising 5. Continued bleeding from incision. 6. Increased pain, redness, or drainage from the incision. 7. Difficulty swallowing or breathing 8. Muscle cramping or spasms. 9. Numbness or tingling in hands or feet or around lips.  The clinic staff is available to   answer your questions during regular business hours.  Please don't hesitate to call and ask to speak to one of the nurses if you have concerns.  For further questions, please visit [email or web address]   

## 2017-06-14 NOTE — Progress Notes (Signed)
2 Days Post-Op   Subjective/Chief Complaint: Comfortable Tolerated clears No nausea or bloating No flatus yet   Objective: Vital signs in last 24 hours: Temp:  [97.6 F (36.4 C)-98.6 F (37 C)] 98.1 F (36.7 C) (07/13 0418) Pulse Rate:  [61-76] 65 (07/13 0418) Resp:  [8-20] 16 (07/13 0522) BP: (113-141)/(59-89) 137/77 (07/13 0418) SpO2:  [94 %-99 %] 96 % (07/13 0522) Last BM Date: 06/12/17  Intake/Output from previous day: 07/12 0701 - 07/13 0700 In: 3478.5 [P.O.:420; I.V.:3058.5] Out: 2565 [Urine:2565] Intake/Output this shift: No intake/output data recorded.  Exam: Awake and alert Abdomen soft, non-distended, active bowel sounds Lab Results:   Recent Labs  06/13/17 0440  WBC 13.6*  HGB 9.9*  HCT 30.1*  PLT 208   BMET  Recent Labs  06/13/17 0440  NA 137  K 3.9  CL 105  CO2 23  GLUCOSE 111*  BUN 14  CREATININE 0.99  CALCIUM 8.8*   PT/INR No results for input(s): LABPROT, INR in the last 72 hours. ABG No results for input(s): PHART, HCO3 in the last 72 hours.  Invalid input(s): PCO2, PO2  Studies/Results: No results found.  Anti-infectives: Anti-infectives    Start     Dose/Rate Route Frequency Ordered Stop   06/12/17 0753  cefoTEtan (CEFOTAN) 2 g in dextrose 5 % 50 mL IVPB     2 g 100 mL/hr over 30 Minutes Intravenous On call to O.R. 06/12/17 0753 06/12/17 1038      Assessment/Plan: s/p Procedure(s): LAPAROSCOPIC ASSISTED PARTIAL COLECTOMY (N/A)  Advance to full liquid diet D/c PCA Ambulate  Path showed a T2,N0 adenocarcinoma.  I discussed the path with the patient and his wife.   LOS: 2 days    Erabella Kuipers A 06/14/2017

## 2017-06-15 ENCOUNTER — Encounter (HOSPITAL_COMMUNITY): Payer: Self-pay | Admitting: Surgery

## 2017-06-15 DIAGNOSIS — I10 Essential (primary) hypertension: Secondary | ICD-10-CM | POA: Diagnosis present

## 2017-06-15 DIAGNOSIS — K219 Gastro-esophageal reflux disease without esophagitis: Secondary | ICD-10-CM | POA: Diagnosis present

## 2017-06-15 NOTE — Progress Notes (Signed)
[facility]:  [phone] General Surgery Progress Note   LOS: 3 days  POD -  3 Days Post-Op  Chief Complaint: Colon cancer  Assessment and Plan: 1.  LAPAROSCOPIC ASSISTED PARTIAL COLECTOMY - 06/12/2017 Ninfa Linden  Path - T2,N0 adenocarcinoma   On Entereg  Await bowel function - otherwise doing well   2.  DVT prophylaxis - Lovenox   Active Problems:   Colon cancer (HCC)   Subjective:  Not much in the way of bowel movement so far - but taking liquids, walking a lot, and doing well with IS.  Objective:   Vitals:   06/14/17 2148 06/15/17 0549  BP: 121/83 (!) 144/77  Pulse: 76 69  Resp: 18 18  Temp: 98.6 F (37 C) 98.1 F (36.7 C)     Intake/Output from previous day:  07/13 0701 - 07/14 0700 In: 1027.1 [P.O.:360; I.V.:667.1] Out: 200 [Urine:200]  Intake/Output this shift:  No intake/output data recorded.   Physical Exam:   General: WN older WM who is alert and oriented.    HEENT: Normal. Pupils equal. .   Lungs: Clear. IS=1,700 cc   Abdomen: Mild distention, good BS   Wound: Clean   Lab Results:    Recent Labs  06/13/17 0440  WBC 13.6*  HGB 9.9*  HCT 30.1*  PLT 208    BMET   Recent Labs  06/13/17 0440  NA 137  K 3.9  CL 105  CO2 23  GLUCOSE 111*  BUN 14  CREATININE 0.99  CALCIUM 8.8*    PT/INR  No results for input(s): LABPROT, INR in the last 72 hours.  ABG  No results for input(s): PHART, HCO3 in the last 72 hours.  Invalid input(s): PCO2, PO2   Studies/Results:  No results found.   Anti-infectives:   Anti-infectives    Start     Dose/Rate Route Frequency Ordered Stop   06/12/17 0753  cefoTEtan (CEFOTAN) 2 g in dextrose 5 % 50 mL IVPB     2 g 100 mL/hr over 30 Minutes Intravenous On call to O.R. 06/12/17 0753 06/12/17 1038      Alphonsa Overall, MD, FACS Pager: [facility]: [phone] 06/15/2017

## 2017-06-16 NOTE — Discharge Summary (Signed)
Physician Discharge Summary  Patient ID:  Kevin Mathews  MRN: [id number]  DOB/AGE: 02/02/1941 76 y.o.  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 Ninfa Linden  Discharged Condition: good  Hospital Course: Kevin Mathews is an 76 y.o. male whose primary care physician is Kevin Orn, MD and who was admitted 06/12/2017 with a chief complaint of right colon cancer.   He was brought to the operating room on 06/12/2017 and underwent  LAPAROSCOPIC ASSISTED PARTIAL COLECTOMY.   He is now 4 days post op, doing well, and ready to go home. His wife, Kevin Mathews, is at the bedside.  The discharge instructions were reviewed with the patient.  Consults: None  Significant Diagnostic Studies: Results for orders placed or performed during the hospital encounter of 19/37/90  Basic metabolic panel  Result Value Ref Range   Sodium 137 135 - 145 mmol/L   Potassium 3.9 3.5 - 5.1 mmol/L   Chloride 105 101 - 111 mmol/L   CO2 23 22 - 32 mmol/L   Glucose, Bld 111 (H) 65 - 99 mg/dL   BUN 14 6 - 20 mg/dL   Creatinine, Ser 0.99 0.61 - 1.24 mg/dL   Calcium 8.8 (L) 8.9 - 10.3 mg/dL   GFR calc non Af Amer >60 >60 mL/min   GFR calc Af Amer >60 >60 mL/min   Anion gap 9 5 - 15  CBC  Result Value Ref Range   WBC 13.6 (H) 4.0 - 10.5 K/uL   RBC 3.80 (L) 4.22 - 5.81 MIL/uL   Hemoglobin 9.9 (L) 13.0 - 17.0 g/dL   HCT 30.1 (L) 39.0 - 52.0 %   MCV 79.2 78.0 - 100.0 fL   MCH 26.1 26.0 - 34.0 pg   MCHC 32.9 30.0 - 36.0 g/dL   RDW 15.4 11.5 - 15.5 %   Platelets 208 150 - 400 K/uL    No results found.  Discharge Exam:  Vitals:   06/15/17 2149 06/16/17 0606  BP: 121/81 123/67  Pulse: 67 72  Resp: 18 18  Temp: 98.8 F (37.1 C) 98.9 F  (37.2 C)    General: WN WM who is alert and generally healthy appearing.  Lungs: Clear to auscultation and symmetric breath sounds. Heart:  RRR. No murmur or rub. Abdomen: Soft.  No hernia. Normal bowel sounds. Incision looks good  Discharge Medications:   Allergies as of 06/16/2017   No Known Allergies     Medication List    TAKE these medications   ADULT GUMMY PO Take 2 tablets by mouth daily.   ALEVE 220 MG tablet Generic drug:  naproxen sodium Take 220 mg by mouth 2 (two) times daily as needed.   aspirin EC 81 MG tablet Take 81 mg by mouth daily.   atorvastatin 10 MG tablet Commonly known as:  LIPITOR Take 10 mg by mouth daily at 6 PM.   b complex vitamins tablet Take 1 tablet by mouth daily.   doxazosin 4 MG tablet Commonly known as:  CARDURA Take 4 mg by mouth every evening.   finasteride 5 MG tablet Commonly known as:  PROSCAR Take 5 mg by mouth every evening.   hydrochlorothiazide 12.5 MG capsule Commonly known as:  MICROZIDE Take 12.5 mg by mouth every evening.   hydrocortisone 2.5 %  cream Apply 1 application topically 2 (two) times daily as needed (itching). Insect bites   ibuprofen 200 MG tablet Commonly known as:  ADVIL,MOTRIN Take 400 mg by mouth every 8 (eight) hours as needed for mild pain.   Krill Oil 500 MG Caps Take 500 mg by mouth daily.   oxyCODONE 5 MG immediate release tablet Commonly known as:  Oxy IR/ROXICODONE Take 1-2 tablets (5-10 mg total) by mouth every 4 (four) hours as needed for moderate pain.   vitamin C 250 MG tablet Commonly known as:  ASCORBIC ACID Take 500 mg by mouth daily. gummys       Disposition: 01-Home or Self Care  Discharge Instructions    Diet - low sodium heart healthy    Complete by:  As directed    Increase activity slowly    Complete by:  As directed       Follow-up Information    Coralie Keens, MD. Schedule an appointment as soon as possible for a visit in 3 week(s).   Specialty:   General Surgery Contact information: [address] [place] [address] [phone]            Signed: Alphonsa Overall, M.D., [facility]:  [phone]  06/16/2017, 9:27 AM

## 2017-07-11 DIAGNOSIS — G250 Essential tremor: Secondary | ICD-10-CM | POA: Diagnosis not present

## 2017-07-11 DIAGNOSIS — R5383 Other fatigue: Secondary | ICD-10-CM | POA: Diagnosis not present

## 2017-07-11 DIAGNOSIS — J069 Acute upper respiratory infection, unspecified: Secondary | ICD-10-CM | POA: Diagnosis not present

## 2017-07-16 DIAGNOSIS — L578 Other skin changes due to chronic exposure to nonionizing radiation: Secondary | ICD-10-CM | POA: Diagnosis not present

## 2017-07-16 DIAGNOSIS — D1801 Hemangioma of skin and subcutaneous tissue: Secondary | ICD-10-CM | POA: Diagnosis not present

## 2017-07-16 DIAGNOSIS — D225 Melanocytic nevi of trunk: Secondary | ICD-10-CM | POA: Diagnosis not present

## 2017-07-16 DIAGNOSIS — L570 Actinic keratosis: Secondary | ICD-10-CM | POA: Diagnosis not present

## 2017-07-16 DIAGNOSIS — L821 Other seborrheic keratosis: Secondary | ICD-10-CM | POA: Diagnosis not present

## 2017-07-18 ENCOUNTER — Telehealth: Payer: Self-pay | Admitting: Hematology

## 2017-07-18 ENCOUNTER — Encounter: Payer: Self-pay | Admitting: Hematology

## 2017-07-18 NOTE — Telephone Encounter (Signed)
Appt has been scheduled for the pt to see Dr. Burr Medico on 8/30 at 11am. Lft vm with the appt date and time. Letter mailed.

## 2017-07-31 NOTE — Progress Notes (Addendum)
[place]  [phone]) [phone] Fax:([phone]) [place] Note   Patient Care Team: Lavone Orn, MD as PCP - General (Internal Medicine) 08/01/2017   Referring physician: Dr. Ninfa Linden  CHIEF COMPLAINTS/PURPOSE OF CONSULTATION:  Malignant neoplasm of ascending colon  Oncology History   Cancer Staging Malignant neoplasm of ascending colon s/p ileocolectomy 06/12/2017 Staging form: Colon and Rectum, AJCC 8th Edition - Pathologic stage from 06/12/2017: Stage I (pT2, pN0, cM0) - Signed by Alla Feeling, NP on 08/01/2017      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 Resected and retrieved. - Tumor in the proximal ascending colon. Biopsied. - The examination was otherwise normal.       04/15/2017 Pathology Results    Diagnosis 04/15/17 by Dr. Wynetta Emery  1. Colon, biopsy, at junction of cecum and ascending - INVASIVE ADENOCARCINOMA 2. Colon, polyp(s), descending - BENIGN COLORECTAL MUCOSA. - ASSOCIATED BENIGN LYMPHOID AGGREGATES. - MELANOSIS COLI PRESENT. - NO DYSPLASIA OR MALIGNANCY IDENTIFIED. Microscopic Comment 1. Dr. Lyndon Code has seen the first specimen (cecal and ascending colon biopsy) in consultation with agreement. The findings are called to [facility] who is taking the result for Dr. Wynetta Emery on 04/16/2017.      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 Coronary artery calcifications are noted suggesting coronary artery disease. Scarring is noted in both lungs. 4 mm nodule is noted in left lung apex. No follow-up needed if patient is low-risk. Non-contrast chest CT can be considered in 12 months if patient is  high-risk. This recommendation follows the consensus statement: Guidelines for Management of Incidental Pulmonary Nodules Detected on CT Images: From the Fleischner Society 2017; Radiology 2017; 284:228-243. Small rounded low density seen peripherally in right hepatic lobe which most likely represent cyst, but metastatic disease cannot be excluded given the history of colonic malignancy. Continued follow-up is recommended. 16 mm soft tissue density seen in the cecum which may represent neoplasm or malignancy, as noted on colonoscopy. Aortic atherosclerosis. Stable prostatic enlargement.      06/12/2017 Surgery    LAPAROSCOPIC ASSISTED PARTIAL COLECTOMY by Dr. Ninfa Linden    The [place] port was placed to the opening and insufflation of the abdomen was begun. 56 [name] reports then placed the patient's upper midline under direct vision. I could easily identify the cecum and ascending colon. There was a tattoo where the colonoscopy biopsy was performed indicating the level of the cancer. I saw no other intra-abdominal pathology. The liver appeared normal. I next mobilized the right colon along the white line of [place] free of the appendix is well with harmonic scalpel. I then took down the hepatic flexure freed gallbladder off of the colon with the Harmonic scalpel as well. The entire right colon became easily mobile and was able to easily move past the midline. At this point I converted to the open portion of procedure. I removed all ports. I created an incision between the 2 five-minute reports with scalpel. I then took this down through the fascia with electrocautery. The peritoneum was then opened entirely to the incision. I was then able to easily eviscerate the cecum and right colon and distal ileum. I transected the distal ileum with  the GIA 75 stapler. I then transected the transverse colon proximally with the GIA 75 staplers well. I then took down the mesentery with the LigaSure cautery device.  I easily palpate the mass inside the colon. The specimen was sent to pathology for evaluation. I then reapproximated the small bowel to the proximal colon in a side-to-side fashion with interrupted silk sutures. I performed a colostomy and enterotomy with the cautery and performed a side-to-side anastomosis with a single firing the GIA 75 stapler. The opening was then closed with TA 60 stapler. I evaluated stable on hemostasis appeared to be achieved prior to closing with a TA 60. I then reinforced stapler with interrupted silk sutures and closed the mesenteric defect silk sutures as well. Again the anastomosis appeared pink and well-perfused.  I then placed colon back into the abdomen. I then irrigated the abdomen with several liters normal saline. Hemostasis again appeared to be achieved.      06/12/2017 Pathology Results    Diagnosis 06/12/17 Colon, segmental resection for tumor, right colon - ADENOCARCINOMA, MODERATELY DIFFERENTIATED - NO CARCINOMA IDENTIFIED IN NINETEEN LYMPH NODES (0/19) - MARGINS UNINVOLVED BY CARCINOMA - BENIGN APPENDIX WITH LUMINAL FIBROSIS - SEE ONCOLOGY TABLE BELOW      HISTORY OF PRESENTING ILLNESS: 08/01/17  Kevin Mathews 76 y.o. male is here today with his wife for recently diagnosed colon cancer found on routine screening colonoscopy. He was referred by Dr. Ninfa Linden. On previous colonoscopy 10 years ago, 1 polyp was removed but was otherwise normal. He has been in his usual state of health prior to this routine screening on 04/25/2017. He had been occasionally constipated and mildly fatigued leading up to his diagnosis; his weight was stable.   He had a CT chest/abdomen/pelvis on 04/25/2017 after the colonoscopy that showed a 16 mm soft tissue density in the cecum; and incidentally revealed a small rounded low density seen peripherally in the right hepatic lobe which most likely represents a cyst, a 12m nodule in the left lung apex, a moderately-sized hiatal hernia, and  coronary artery calcifications.   Surgical pathology from lap assisted partial ileocolectomy on 06/12/2017 from right colon resection confirmed moderately differentiated adenocarcinoma, 0/19 lymph nodes negative for disease, and negative margins.  He was anemic at the time of surgery with Hgb 9.9 on 06/13/17; he began taking Ferrous sulfate 1 week ago. Since surgery his energy has improved, he has an appetite but notes early satiety. He has regular and normal bowel movements now. He is using advil for post-op pain, no requiring oxycodone. He has heartburn that is controlled with daily antacid. He denies abdominal pain, nausea, vomiting, diarrhea, blood in stool or rectal bleeding. He has occasionally productive cough but this is improving. He notes dizziness with abrupt position changes such as standing up too quickly.   In the past he has had arthritis, GERD, HTN. His wife says he had a bump on his lip last year that his dermatologist froze. He has a distant history of smoking 3-4 cigarettes per day for 4 years.   CURRENT THERAPY:  Colon cancer surveillance   MEDICAL HISTORY:  Past Medical History:  Diagnosis Date  . Arthritis   . Dyspnea    with excertion  . Frequent urination   . GERD (gastroesophageal reflux disease)    occasional  . History of kidney stones   . Hypertension   . Malignant neoplasm of ascending colon s/p ileocolectomy 06/12/2017 06/12/2017    SURGICAL HISTORY: Past Surgical History:  Procedure  Laterality Date  . COLONOSCOPY WITH PROPOFOL N/A 04/15/2017   Procedure: COLONOSCOPY WITH PROPOFOL;  Surgeon: Garlan Fair, MD;  Location: [facility];  Service: Endoscopy;  Laterality: N/A;  . LAPAROSCOPIC PARTIAL COLECTOMY N/A 06/12/2017   Procedure: LAPAROSCOPIC ASSISTED PARTIAL COLECTOMY;  Surgeon: Coralie Keens, MD;  Location: [facility];  Service: General;  Laterality: N/A;  . NASAL SINUS SURGERY  1979  . TOTAL SHOULDER ARTHROPLASTY Left 07/01/2013   Procedure: TOTAL  SHOULDER ARTHROPLASTY;  Surgeon: Ninetta Lights, MD;  Location: [facility];  Service: Orthopedics;  Laterality: Left;  with interscalene block    SOCIAL HISTORY: Social History   Social History  . Marital status: Married    Spouse name: N/A  . Number of children: N/A  . Years of education: N/A   Occupational History  . Not on file.   Social History Main Topics  . Smoking status: Former Smoker    Quit date: 12/03/1968  . Smokeless tobacco: Never Used     Comment: not often  . Alcohol use No  . Drug use: No  . Sexual activity: Yes   Other Topics Concern  . Not on file   Social History Narrative  . No narrative on file    FAMILY HISTORY: History reviewed. No pertinent family history. He denies family history of cancer.   ALLERGIES:  has No Known Allergies.  MEDICATIONS:  Current Outpatient Prescriptions  Medication Sig Dispense Refill  . aspirin EC 81 MG tablet Take 81 mg by mouth daily.    Marland Kitchen atorvastatin (LIPITOR) 10 MG tablet Take 10 mg by mouth daily at 6 PM.    . b complex vitamins tablet Take 1 tablet by mouth daily.    Marland Kitchen doxazosin (CARDURA) 4 MG tablet Take 4 mg by mouth every evening.     . finasteride (PROSCAR) 5 MG tablet Take 5 mg by mouth every evening.     . hydrochlorothiazide (MICROZIDE) 12.5 MG capsule Take 12.5 mg by mouth every evening.     . hydrocortisone 2.5 % cream Apply 1 application topically 2 (two) times daily as needed (itching). Insect bites    . ibuprofen (ADVIL,MOTRIN) 200 MG tablet Take 400 mg by mouth every 8 (eight) hours as needed for mild pain.    Javier Docker Oil 500 MG CAPS Take 500 mg by mouth daily.    . Multiple Vitamins-Minerals (ADULT GUMMY PO) Take 2 tablets by mouth daily.    . naproxen sodium (ALEVE) 220 MG tablet Take 220 mg by mouth 2 (two) times daily as needed.    . vitamin C (ASCORBIC ACID) 250 MG tablet Take 500 mg by mouth daily. gummys    . oxyCODONE (OXY IR/ROXICODONE) 5 MG immediate release tablet Take 1-2 tablets (5-10 mg  total) by mouth every 4 (four) hours as needed for moderate pain. (Patient not taking: Reported on 08/01/2017) 30 tablet 0   No current facility-administered medications for this visit.     REVIEW OF SYSTEMS:   Constitutional: Denies fevers, chills or abnormal night sweats Eyes: Denies blurriness of vision, double vision or watery eyes Ears, nose, mouth, throat, and face: Denies mucositis or sore throat Respiratory: Denies dyspnea or wheezes (+) slight congestive cough, improving Cardiovascular: Denies palpitation, chest discomfort or lower extremity swelling Gastrointestinal:  Denies nausea, vomiting, constipation, or diarrhea. (+) GERD/Heartburn (+) early satiety Skin: Denies abnormal skin rashes Lymphatics: Denies new lymphadenopathy or easy bruising Neurological:Denies numbness, tingling or new weaknesses Behavioral/Psych: Mood is stable, no new changes  All other systems were reviewed with the patient and are negative.  PHYSICAL EXAMINATION: ECOG PERFORMANCE STATUS: 1 - Symptomatic but completely ambulatory  Vitals:   08/01/17 1044  BP: 130/61  Pulse: 61  Resp: 18  Temp: 98.2 F (36.8 C)  SpO2: 98%   Filed Weights   08/01/17 1044  Weight: 181 lb 3.2 oz (82.2 kg)   GENERAL:alert, no distress and comfortable SKIN: skin color, texture, turgor are normal, no rashes or significant lesions EYES: normal, conjunctiva are pink and non-injected, sclera clear OROPHARYNX:no exudate, no erythema and lips, buccal mucosa, and tongue normal  NECK: supple, thyroid normal size, non-tender, without nodularity LYMPH:  no palpable cervical or supraclavicular lymphadenopathy LUNGS: clear to auscultation bilaterally, normal breathing effort HEART: regular rate & rhythm, S1 and S2 present, no murmurs. no lower extremity edema ABDOMEN:abdomen soft, non-tender and normal bowel sounds (+) s/p ileocolectomy, upper midline abd incision pink, healing well, non-tender. Musculoskeletal:no cyanosis of  digits and no clubbing  PSYCH: alert & oriented x 3 with fluent speech NEURO: no focal motor/sensory deficits  LABORATORY DATA:  I have reviewed the data as listed CBC Latest Ref Rng & Units 06/13/2017 06/07/2017 04/15/2017  WBC 4.0 - 10.5 K/uL 13.6(H) 6.4 -  Hemoglobin 13.0 - 17.0 g/dL 9.9(L) 11.0(L) 10.9(L)  Hematocrit 39.0 - 52.0 % 30.1(L) 34.2(L) 32.0(L)  Platelets 150 - 400 K/uL 208 240 -    CMP Latest Ref Rng & Units 06/13/2017 06/07/2017 04/15/2017  Glucose 65 - 99 mg/dL 111(H) 105(H) 96  BUN 6 - 20 mg/dL 14 22(H) -  Creatinine 0.61 - 1.24 mg/dL 0.99 1.12 -  Sodium 135 - 145 mmol/L 137 139 140  Potassium 3.5 - 5.1 mmol/L 3.9 4.7 4.1  Chloride 101 - 111 mmol/L 105 109 -  CO2 22 - 32 mmol/L 23 25 -  Calcium 8.9 - 10.3 mg/dL 8.8(L) 9.4 -  Total Protein 6.0 - 8.3 g/dL - - -  Total Bilirubin 0.3 - 1.2 mg/dL - - -  Alkaline Phos 39 - 117 U/L - - -  AST 0 - 37 U/L - - -  ALT 0 - 53 U/L - - -   PATHOLOGY  Diagnosis 06/12/17 Colon, segmental resection for tumor, right colon - ADENOCARCINOMA, MODERATELY DIFFERENTIATED - NO CARCINOMA IDENTIFIED IN NINETEEN LYMPH NODES (0/19) - MARGINS UNINVOLVED BY CARCINOMA - BENIGN APPENDIX WITH LUMINAL FIBROSIS - SEE ONCOLOGY TABLE BELOW Microscopic Comment COLON AND RECTUM (INCLUDING TRANS-ANAL RESECTION): Specimen: Right colon and terminal ileum Procedure: Right hemicolectomy Tumor site: Cecum Specimen integrity: Intact Macroscopic tumor perforation: Not identified Invasive tumor: Maximum size: 2.8 cm Histologic type: Adenocarcinoma Histologic grade and differentiation: G2 (moderately differentiated/low grade) Microscopic extension of invasive tumor: Tumor invades muscularis propria Lymph-Vascular invasion: Not identified Peri-neural invasion: Not identified Tumor deposit(s): Not identified Resection margins: All margins are uninvolved by invasive carcinoma, high-grade dysplasia, intramucosal adenocarcinoma, and adenoma Distance closest  margin (if all above margins negative): Proximal margin 8.7 cm Treatment effect (neo-adjuvant therapy): No know presurgical therapy Additional polyp(s): Not identified Non-neoplastic findings: Not identified Lymph nodes: number examined 19; number positive: 0 Pathologic Staging: T2, N0 Ancillary studies: MMR (immunohistochemistry) and MSI (PCR) are pending and will be reported in an addendum.    Diagnosis 04/15/17 by Dr. Wynetta Emery  1. Colon, biopsy, at junction of cecum and ascending - INVASIVE ADENOCARCINOMA 2. Colon, polyp(s), descending - BENIGN COLORECTAL MUCOSA. - ASSOCIATED BENIGN LYMPHOID AGGREGATES. - MELANOSIS COLI PRESENT. - NO DYSPLASIA OR MALIGNANCY IDENTIFIED. Microscopic Comment 1. Dr. Lyndon Code  has seen the first specimen (cecal and ascending colon biopsy) in consultation with agreement. The findings are called to [facility] who is taking the result for Dr. Wynetta Emery on 04/16/2017.  PROCEDURES  Colonoscopy 04/15/17 IMPRESSION - One 5 mm polyp in the proximal descending colon, removed with a cold snare. Resected and retrieved. - Tumor in the proximal ascending colon. Biopsied. - The examination was otherwise normal.  RADIOGRAPHIC STUDIES: I have personally reviewed the radiological images as listed and agreed with the findings in the report. No results found.   CT CAP W Contrast 04/25/17 IMPRESSION: Moderate size sliding-type hiatal hernia. Coronary artery calcifications are noted suggesting coronary artery disease. Scarring is noted in both lungs. 4 mm nodule is noted in left lung apex. No follow-up needed if patient is low-risk. Non-contrast chest CT can be considered in 12 months if patient is high-risk. This recommendation follows the consensus statement: Guidelines for Management of Incidental Pulmonary Nodules Detected on CT Images: From the Fleischner Society 2017; Radiology 2017; 284:228-243. Small rounded low density seen peripherally in right hepatic lobe which  most likely represent cyst, but metastatic disease cannot be excluded given the history of colonic malignancy. Continued follow-up is recommended. 16 mm soft tissue density seen in the cecum which may represent neoplasm or malignancy, as noted on colonoscopy. Aortic atherosclerosis. Stable prostatic enlargement.  ASSESSMENT & PLAN:  Kevin Mathews is a 76 y.o. caucasian male with a history of GERD, HTN, and osteoarthritis with newly diagnosed stage I colon cancer s/p partial ileocolectomy on 06/12/2017.   1. Malignant neoplasm of ascending colon, pT2N0M0, stage I, G2 -We discussed his pathology results for his surgery which showed a stage I disease with no positive lymph nodes. No other high risk features, such as lymphovascular invasion, high-grade, etc.  -We discussed the findings from the CT scan in detail (see below), no evidence of distant metastasis, his and his wife's questions were answered to satisfaction. -His cancer was resected with negative margins -We reviewed his risk of recurrence after surgical resection. Giving the early stage disease, no high-risk features, his risk is very low (<10%). I recommend him to have routine follow-up.  -Will proceed with cancer surveillance consisting of labs/CEA and physical exam every 6 months. Routine surveillance CT scan is probably not necessary giving the stage I disease without high-risk features. -Follow up in 6 months with lab   2. Low density lesion in liver -His CT scan showed a small rounded low density seen peripherally in right hepatic lobe most likely represents a cyst -After visit, I discussed case with Dr. Nyoka Cowden, Radiologist, who compared 04/2017 CT C/A/P with 2016 CT abdomen and MRI abdomen. He confirms the area in question is the same size and location as previously seen in 2016 and has not changed; confirms this is a cyst and does not require further testing.    3. Lung Nodule -4 mm left lung apex nodule on 04/25/17 CT Scan -He  is low risk for lung cancer based on light smoking history  -It is likely benign, no need for follow-up a CT scan  4. Arthrosclerosis in his heart -Currently on Aspirin 81 mg daily and Lipitor 10 mg daily  -He will follow up with PCP   5. Anemia, probable iron deficient anemia -Post-op anemia Hgb 9.9 on 06/13/17 -He began Ferrous sulfate 1 week ago per other provider -Will check iron level at next visit  PLAN:  -I recommend routine colon cancer surveillance  -Lab and f/u in 6 months -  CEA, ferritin and iron studies at next visit  -Routine f/u with PCP  Orders Placed This Encounter  Procedures  . CBC with Differential    Standing Status:   Standing    Number of Occurrences:   10    Standing Expiration Date:   08/01/2018  . Comprehensive metabolic panel    Standing Status:   Standing    Number of Occurrences:   10    Standing Expiration Date:   08/01/2018  . CEA    Standing Status:   Standing    Number of Occurrences:   10    Standing Expiration Date:   08/01/2018  . Ferritin    Standing Status:   Future    Standing Expiration Date:   08/01/2018  . Iron and TIBC    Standing Status:   Future    Standing Expiration Date:   08/01/2018    All questions were answered. The patient knows to call the clinic with any problems, questions or concerns. I spent 40 minutes counseling the patient face to face. The total time spent in the appointment was 45 minutes and more than 50% was on counseling.  This document serves as a record of services personally performed by Truitt Merle, MD. It was created on her behalf by Joslyn Devon, a trained medical scribe. The creation of this record is based on the scribe's personal observations and the provider's statements to them. This document has been checked and approved by the attending provider.     Truitt Merle, MD 08/01/2017 5:27 PM

## 2017-08-01 ENCOUNTER — Ambulatory Visit (HOSPITAL_BASED_OUTPATIENT_CLINIC_OR_DEPARTMENT_OTHER): Payer: PPO | Admitting: Hematology

## 2017-08-01 ENCOUNTER — Telehealth: Payer: Self-pay | Admitting: Hematology

## 2017-08-01 ENCOUNTER — Encounter: Payer: Self-pay | Admitting: Hematology

## 2017-08-01 ENCOUNTER — Encounter (INDEPENDENT_AMBULATORY_CARE_PROVIDER_SITE_OTHER): Payer: Self-pay

## 2017-08-01 VITALS — BP 130/61 | HR 61 | Temp 98.2°F | Resp 18 | Ht 67.5 in | Wt 181.2 lb

## 2017-08-01 DIAGNOSIS — Z87891 Personal history of nicotine dependence: Secondary | ICD-10-CM

## 2017-08-01 DIAGNOSIS — R911 Solitary pulmonary nodule: Secondary | ICD-10-CM

## 2017-08-01 DIAGNOSIS — C182 Malignant neoplasm of ascending colon: Secondary | ICD-10-CM | POA: Diagnosis not present

## 2017-08-01 DIAGNOSIS — K769 Liver disease, unspecified: Secondary | ICD-10-CM | POA: Diagnosis not present

## 2017-08-01 DIAGNOSIS — D649 Anemia, unspecified: Secondary | ICD-10-CM

## 2017-08-01 NOTE — Telephone Encounter (Signed)
Scheduled appt per 8/30 los - Gave patient AVS and calender per los.  

## 2017-08-01 NOTE — Progress Notes (Signed)
  Oncology Nurse Navigator Documentation  Navigator Location: [facility] (08/01/17 1130) Referral date to RadOnc/MedOnc: 07/18/17 (08/01/17 1130) )Navigator Encounter Type: Initial MedOnc (08/01/17 1130)  Met with patient during initial Med/Onc appointment to introduce myself and my role as GI Navigator. Patient information packet provided with information on [facility] support services etc as well contact information for members of our treatment team. No questions voiced. Plan: I am available for concerns or questions as needed.   Abnormal Finding Date: 04/05/17 (08/01/17 1130) Confirmed Diagnosis Date: 04/05/17 (08/01/17 1130) Surgery Date: 06/12/17 (08/01/17 1130)           Treatment Initiated Date: 06/12/17 (08/01/17 1130) Patient Visit Type: MedOnc (08/01/17 1130) Treatment Phase: Other (New Patient Appointment) (08/01/17 1130) Barriers/Navigation Needs: No Needs (08/01/17 1130)   Interventions: Psycho-social support (08/01/17 1130)        Support Groups/Services: GI Support Group (08/01/17 1130)   Acuity: Level 1 (08/01/17 1130) Acuity Level 1: Initial guidance, education and coordination as needed;Minimal follow up required (08/01/17 1130)       Time Spent with Patient: 15 (08/01/17 1130)

## 2017-08-02 ENCOUNTER — Telehealth: Payer: Self-pay | Admitting: Nurse Practitioner

## 2017-08-02 NOTE — Telephone Encounter (Signed)
Per Dr. Burr Medico, informed patient that she and radiologist reviewed 2016 and 2018 scans to compare liver lesion. The area identified in 2018 as a possible cyst is the same size and location as was identified in 2016 and confirms this is a cyst. This does not require further testing or evaluation at this time. She is encouraged to call with new concerns or questions. He verbalizes understanding and thanks for the call.

## 2017-09-10 DIAGNOSIS — Z5181 Encounter for therapeutic drug level monitoring: Secondary | ICD-10-CM | POA: Diagnosis not present

## 2017-09-10 DIAGNOSIS — E785 Hyperlipidemia, unspecified: Secondary | ICD-10-CM | POA: Diagnosis not present

## 2017-09-10 DIAGNOSIS — D509 Iron deficiency anemia, unspecified: Secondary | ICD-10-CM | POA: Diagnosis not present

## 2017-12-30 ENCOUNTER — Telehealth: Payer: Self-pay | Admitting: *Deleted

## 2017-12-30 ENCOUNTER — Other Ambulatory Visit: Payer: Self-pay | Admitting: Orthopedic Surgery

## 2017-12-30 DIAGNOSIS — M19011 Primary osteoarthritis, right shoulder: Secondary | ICD-10-CM | POA: Diagnosis not present

## 2017-12-30 DIAGNOSIS — M25511 Pain in right shoulder: Secondary | ICD-10-CM

## 2017-12-30 NOTE — Telephone Encounter (Signed)
Received vm call from pt stating that he will be having shoulder surgery in the future & his surgeon will be sending papers for release.  He states he sees Dr Burr Medico 01/30/18.  Returned call & informed that papers not seen yet.  He states he just saw MD this am.  Will watch for papers.

## 2018-01-01 ENCOUNTER — Other Ambulatory Visit: Payer: PPO

## 2018-01-02 ENCOUNTER — Ambulatory Visit
Admission: RE | Admit: 2018-01-02 | Discharge: 2018-01-02 | Disposition: A | Discharge status: Home / Self Care | Payer: Medicare HMO | Source: Ambulatory Visit | Attending: Orthopedic Surgery | Admitting: Orthopedic Surgery

## 2018-01-02 ENCOUNTER — Other Ambulatory Visit: Payer: PPO

## 2018-01-02 DIAGNOSIS — M25511 Pain in right shoulder: Secondary | ICD-10-CM

## 2018-01-02 DIAGNOSIS — M19011 Primary osteoarthritis, right shoulder: Secondary | ICD-10-CM | POA: Diagnosis not present

## 2018-01-24 DIAGNOSIS — D509 Iron deficiency anemia, unspecified: Secondary | ICD-10-CM | POA: Diagnosis not present

## 2018-01-24 DIAGNOSIS — Z1389 Encounter for screening for other disorder: Secondary | ICD-10-CM | POA: Diagnosis not present

## 2018-01-24 DIAGNOSIS — Z0000 Encounter for general adult medical examination without abnormal findings: Secondary | ICD-10-CM | POA: Diagnosis not present

## 2018-01-24 DIAGNOSIS — I10 Essential (primary) hypertension: Secondary | ICD-10-CM | POA: Diagnosis not present

## 2018-01-24 DIAGNOSIS — N400 Enlarged prostate without lower urinary tract symptoms: Secondary | ICD-10-CM | POA: Diagnosis not present

## 2018-01-24 DIAGNOSIS — G250 Essential tremor: Secondary | ICD-10-CM | POA: Diagnosis not present

## 2018-01-24 DIAGNOSIS — Z85038 Personal history of other malignant neoplasm of large intestine: Secondary | ICD-10-CM | POA: Diagnosis not present

## 2018-01-24 DIAGNOSIS — K219 Gastro-esophageal reflux disease without esophagitis: Secondary | ICD-10-CM | POA: Diagnosis not present

## 2018-01-24 DIAGNOSIS — E7800 Pure hypercholesterolemia, unspecified: Secondary | ICD-10-CM | POA: Diagnosis not present

## 2018-01-24 DIAGNOSIS — I2510 Atherosclerotic heart disease of native coronary artery without angina pectoris: Secondary | ICD-10-CM | POA: Diagnosis not present

## 2018-01-29 ENCOUNTER — Other Ambulatory Visit: Payer: Self-pay | Admitting: Hematology

## 2018-01-29 DIAGNOSIS — C182 Malignant neoplasm of ascending colon: Secondary | ICD-10-CM

## 2018-01-29 NOTE — Progress Notes (Signed)
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 Resected and retrieved. - Tumor in the proximal ascending colon. Biopsied. - The examination was otherwise normal.       04/15/2017 Pathology Results    Diagnosis 04/15/17 by Dr. Wynetta Emery  1. Colon, biopsy, at junction of cecum and ascending - INVASIVE ADENOCARCINOMA 2. Colon, polyp(s), descending - BENIGN COLORECTAL MUCOSA. - ASSOCIATED BENIGN LYMPHOID AGGREGATES. - MELANOSIS COLI PRESENT. - NO DYSPLASIA OR MALIGNANCY IDENTIFIED. Microscopic Comment 1. Dr. Lyndon Code has seen the first specimen (cecal and ascending colon biopsy) in consultation with agreement. The findings are called to [facility] who is taking the result for Dr. Wynetta Emery on 04/16/2017.      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 Coronary artery calcifications are noted suggesting coronary artery disease. Scarring is noted in both lungs. 4 mm nodule is noted in left lung apex. No follow-up needed if patient is low-risk.  Non-contrast chest CT can be considered in 12 months if patient is high-risk. This recommendation follows the consensus statement: Guidelines for Management of Incidental Pulmonary Nodules Detected on CT Images: From the Fleischner Society 2017; Radiology 2017; 284:228-243. Small rounded low density seen peripherally in right hepatic lobe which most likely represent cyst, but metastatic disease cannot be excluded given the history of colonic malignancy. Continued follow-up is recommended. 16 mm soft tissue density seen in the cecum which may represent neoplasm or malignancy, as noted on colonoscopy. Aortic atherosclerosis. Stable prostatic enlargement.      06/12/2017 Surgery    LAPAROSCOPIC ASSISTED PARTIAL COLECTOMY by Dr. Ninfa Linden    The Victoria port was placed to the opening and insufflation of the abdomen was begun. 77 [name] reports then placed the patient's upper midline under direct vision. I could easily identify the cecum and ascending colon. There was a tattoo where the colonoscopy biopsy was performed indicating the level of the cancer. I saw no other intra-abdominal pathology. The liver appeared normal. I next mobilized the right colon along the white line of [place] free of the appendix is well with harmonic scalpel. I then took down the hepatic flexure freed gallbladder off of the colon with the Harmonic scalpel as well. The entire right colon became easily mobile and was able to easily move past the midline. At this point I converted to the open portion of procedure. I removed all ports. I created an incision between the 2 five-minute reports with scalpel. I then took this down through the fascia with electrocautery. The peritoneum was then opened entirely to the incision. I was then  able to easily eviscerate the cecum and right colon and distal ileum. I transected the distal ileum with the GIA 75 stapler. I then transected the transverse colon proximally with the GIA 75 staplers  well. I then took down the mesentery with the LigaSure cautery device. I easily palpate the mass inside the colon. The specimen was sent to pathology for evaluation. I then reapproximated the small bowel to the proximal colon in a side-to-side fashion with interrupted silk sutures. I performed a colostomy and enterotomy with the cautery and performed a side-to-side anastomosis with a single firing the GIA 75 stapler. The opening was then closed with TA 60 stapler. I evaluated stable on hemostasis appeared to be achieved prior to closing with a TA 60. I then reinforced stapler with interrupted silk sutures and closed the mesenteric defect silk sutures as well. Again the anastomosis appeared pink and well-perfused.  I then placed colon back into the abdomen. I then irrigated the abdomen with several liters normal saline. Hemostasis again appeared to be achieved.      06/12/2017 Pathology Results    Diagnosis 06/12/17 Colon, segmental resection for tumor, right colon - ADENOCARCINOMA, MODERATELY DIFFERENTIATED - NO CARCINOMA IDENTIFIED IN NINETEEN LYMPH NODES (0/19) - MARGINS UNINVOLVED BY CARCINOMA - BENIGN APPENDIX WITH LUMINAL FIBROSIS - SEE ONCOLOGY TABLE BELOW      HISTORY OF PRESENTING ILLNESS: 08/01/17  Kevin Mathews 77 y.o. male is here today with his wife for recently diagnosed colon cancer found on routine screening colonoscopy. He was referred by Dr. Ninfa Linden. On previous colonoscopy 10 years ago, 1 polyp was removed but was otherwise normal. He has been in his usual state of health prior to this routine screening on 04/25/2017. He had been occasionally constipated and mildly fatigued leading up to his diagnosis; his weight was stable.   He had a CT chest/abdomen/pelvis on 04/25/2017 after the colonoscopy that showed a 16 mm soft tissue density in the cecum; and incidentally revealed a small rounded low density seen peripherally in the right hepatic lobe which most likely represents a cyst, a  75m nodule in the left lung apex, a moderately-sized hiatal hernia, and coronary artery calcifications.   Surgical pathology from lap assisted partial ileocolectomy on 06/12/2017 from right colon resection confirmed moderately differentiated adenocarcinoma, 0/19 lymph nodes negative for disease, and negative margins.  He was anemic at the time of surgery with Hgb 9.9 on 06/13/17; he began taking Ferrous sulfate 1 week ago. Since surgery his energy has improved, he has an appetite but notes early satiety. He has regular and normal bowel movements now. He is using Advil for post-op pain, no requiring oxycodone. He has heartburn that is controlled with daily antacid. He denies abdominal pain, nausea, vomiting, diarrhea, blood in stool or rectal bleeding. He has occasionally productive cough but this is improving. He notes dizziness with abrupt position changes such as standing up too quickly.   In the past he has had arthritis, GERD, HTN. His wife says he had a bump on his lip last year that his dermatologist froze. He has a distant history of smoking 3-4 cigarettes per day for 4 years.   CURRENT THERAPY: Colon cancer surveillance    INTERVAL HISTORY  JNATHYN LUIZis here for a follow up of his colon cancer. He was last seen by me 6 months. He presents to the clinic today accompanied by his wife. He notes he feels well and he gained some weight. He notes Dr. GCoral Spikesgave him  oral iron and he is able to now get off pill now that his anemia is better. Today Hg at 14.8. He has joined the gym and plan to go today. Pt notes Dr. Laurann Montana discussed he had Coronary artery calcifications and patient wants to know what he can do to help this.  He has not seen Dr. Ninfa Linden recently and has not made plans to see him again. He will see Dr. Paulita Fujita to repeat colonoscopy in 04/2018.  On review of symptoms, pt notes he feels well. He has been burping lately. He denies abdominal pain and his BM are regular. He notes he has  occasional black stool, likely from iron pill.    MEDICAL HISTORY:  Past Medical History:  Diagnosis Date  . Arthritis   . Dyspnea    with excertion  . Frequent urination   . GERD (gastroesophageal reflux disease)    occasional  . History of kidney stones   . Hypertension   . 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 COLONOSCOPY WITH PROPOFOL N/A 04/15/2017   Procedure: COLONOSCOPY WITH PROPOFOL;  Surgeon: Garlan Fair, MD;  Location: [facility];  Service: Endoscopy;  Laterality: N/A;  . LAPAROSCOPIC PARTIAL COLECTOMY N/A 06/12/2017   Procedure: LAPAROSCOPIC ASSISTED PARTIAL COLECTOMY;  Surgeon: Coralie Keens, MD;  Location: [facility];  Service: General;  Laterality: N/A;  . NASAL SINUS SURGERY  1979  . TOTAL SHOULDER ARTHROPLASTY Left 07/01/2013   Procedure: TOTAL SHOULDER ARTHROPLASTY;  Surgeon: Ninetta Lights, MD;  Location: [facility];  Service: Orthopedics;  Laterality: Left;  with interscalene block    SOCIAL HISTORY: Social History   Socioeconomic History  . Marital status: Married    Spouse name: Not on file  . Number of children: Not on file  . Years of education: Not on file  . Highest education level: Not on file  Social Needs  . Financial resource strain: Not on file  . Food insecurity - worry: Not on file  . Food insecurity - inability: Not on file  . Transportation needs - medical: Not on file  . Transportation needs - non-medical: Not on file  Occupational History  . Not on file  Tobacco Use  . Smoking status: Former Smoker    Last attempt to quit: 12/03/1968    Years since quitting: 49.1  . Smokeless tobacco: Never Used  . Tobacco comment: not often  Substance and Sexual Activity  . Alcohol use: No  . Drug use: No  . Sexual activity: Yes  Other Topics Concern  . Not on file  Social History Narrative  . Not on file    FAMILY  HISTORY: History reviewed. No pertinent family history. He denies family history of cancer.   ALLERGIES:  has No Known Allergies.  MEDICATIONS:  Current Outpatient Medications  Medication Sig Dispense Refill  . aspirin EC 81 MG tablet Take 81 mg by mouth daily.    Marland Kitchen atorvastatin (LIPITOR) 10 MG tablet Take 10 mg by mouth daily at 6 PM.    . b complex vitamins tablet Take 1 tablet by mouth daily.    Marland Kitchen doxazosin (CARDURA) 4 MG tablet Take 4 mg by mouth every evening.     . finasteride (PROSCAR) 5 MG tablet Take 5 mg by mouth every evening.     . hydrochlorothiazide (MICROZIDE) 12.5 MG capsule Take 12.5 mg by mouth every evening.     Marland Kitchen  ibuprofen (ADVIL,MOTRIN) 200 MG tablet Take 400 mg by mouth every 8 (eight) hours as needed for mild pain.    Javier Docker Oil 500 MG CAPS Take 500 mg by mouth daily.    . Multiple Vitamins-Minerals (ADULT GUMMY PO) Take 2 tablets by mouth daily.    . naproxen sodium (ALEVE) 220 MG tablet Take 220 mg by mouth 2 (two) times daily as needed.    . vitamin C (ASCORBIC ACID) 250 MG tablet Take 500 mg by mouth daily. gummys    . oxyCODONE (OXY IR/ROXICODONE) 5 MG immediate release tablet Take 1-2 tablets (5-10 mg total) by mouth every 4 (four) hours as needed for moderate pain. (Patient not taking: Reported on 08/01/2017) 30 tablet 0   No current facility-administered medications for this visit.     REVIEW OF SYSTEMS:   Constitutional: Denies fevers, chills or abnormal night sweats (+) weight gain Eyes: Denies blurriness of vision, double vision or watery eyes Ears, nose, mouth, throat, and face: Denies mucositis or sore throat Respiratory: Denies dyspnea or wheezes  Cardiovascular: Denies palpitation, chest discomfort or lower extremity swelling Gastrointestinal:  Denies nausea, vomiting, constipation, or diarrhea. (+) GERD/Burping Skin: Denies abnormal skin rashes Lymphatics: Denies new lymphadenopathy or easy bruising Neurological:Denies numbness, tingling or new  weaknesses Behavioral/Psych: Mood is stable, no new changes  All other systems were reviewed with the patient and are negative.  PHYSICAL EXAMINATION: ECOG PERFORMANCE STATUS: 0  Vitals:   01/30/18 1016  BP: (!) 141/80  Pulse: (!) 52  Resp: 20  Temp: 98.2 F (36.8 C)  SpO2: 100%   Filed Weights   01/30/18 1016  Weight: 190 lb (86.2 kg)     GENERAL:alert, no distress and comfortable SKIN: skin color, texture, turgor are normal, no rashes or significant lesions EYES: normal, conjunctiva are pink and non-injected, sclera clear OROPHARYNX:no exudate, no erythema and lips, buccal mucosa, and tongue normal  NECK: supple, thyroid normal size, non-tender, without nodularity LYMPH:  no palpable cervical or supraclavicular lymphadenopathy LUNGS: clear to auscultation bilaterally, normal breathing effort HEART: regular rate & rhythm, S1 and S2 present, no murmurs. no lower extremity edema ABDOMEN:abdomen soft, non-tender and normal bowel sounds (+) s/p ileocolectomy, upper midline abd incision pink, healing well, non-tender. Musculoskeletal:no cyanosis of digits and no clubbing  PSYCH: alert & oriented x 3 with fluent speech NEURO: no focal motor/sensory deficits  LABORATORY DATA:  I have reviewed the data as listed CBC Latest Ref Rng & Units 01/30/2018 06/13/2017 06/07/2017  WBC 4.0 - 10.3 K/uL 5.6 13.6(H) 6.4  Hemoglobin 13.0 - 17.1 g/dL 14.8 9.9(L) 11.0(L)  Hematocrit 38.4 - 49.9 % 44.1 30.1(L) 34.2(L)  Platelets 140 - 400 K/uL 197 208 240    CMP Latest Ref Rng & Units 01/30/2018 06/13/2017 06/07/2017  Glucose 70 - 140 mg/dL 102 111(H) 105(H)  BUN 7 - 26 mg/dL 19 14 22(H)  Creatinine 0.70 - 1.30 mg/dL 0.99 0.99 1.12  Sodium 136 - 145 mmol/L 141 137 139  Potassium 3.5 - 5.1 mmol/L 3.8 3.9 4.7  Chloride 98 - 109 mmol/L 105 105 109  CO2 22 - 29 mmol/L _0 Calcium 8.4 - 10.4 mg/dL 9.7 8.8(L) 9.4  Total Protein 6.4 - 8.3 g/dL 7.3 - -  Total Bilirubin 0.2 - 1.2 mg/dL 0.7 - -    Alkaline Phos 40 - 150 U/L 114 - -  AST 5 - 34 U/L 24 - -  ALT 0 - 55 U/L 31 - -   PATHOLOGY  Diagnosis 06/12/17 Colon, segmental resection for tumor, right colon - ADENOCARCINOMA, MODERATELY DIFFERENTIATED - NO CARCINOMA IDENTIFIED IN NINETEEN LYMPH NODES (0/19) - MARGINS UNINVOLVED BY CARCINOMA - BENIGN APPENDIX WITH LUMINAL FIBROSIS - SEE ONCOLOGY TABLE BELOW Microscopic Comment COLON AND RECTUM (INCLUDING TRANS-ANAL RESECTION): Specimen: Right colon and terminal ileum Procedure: Right hemicolectomy Tumor site: Cecum Specimen integrity: Intact Macroscopic tumor perforation: Not identified Invasive tumor: Maximum size: 2.8 cm Histologic type: Adenocarcinoma Histologic grade and differentiation: G2 (moderately differentiated/low grade) Microscopic extension of invasive tumor: Tumor invades muscularis propria Lymph-Vascular invasion: Not identified Peri-neural invasion: Not identified Tumor deposit(s): Not identified Resection margins: All margins are uninvolved by invasive carcinoma, high-grade dysplasia, intramucosal adenocarcinoma, and adenoma Distance closest margin (if all above margins negative): Proximal margin 8.7 cm Treatment effect (neo-adjuvant therapy): No know presurgical therapy Additional polyp(s): Not identified Non-neoplastic findings: Not identified Lymph nodes: number examined 19; number positive: 0 Pathologic Staging: T2, N0 Ancillary studies: MMR (immunohistochemistry) and MSI (PCR) are pending and will be reported in an addendum.    Diagnosis 04/15/17 by Dr. Wynetta Emery  1. Colon, biopsy, at junction of cecum and ascending - INVASIVE ADENOCARCINOMA 2. Colon, polyp(s), descending - BENIGN COLORECTAL MUCOSA. - ASSOCIATED BENIGN LYMPHOID AGGREGATES. - MELANOSIS COLI PRESENT. - NO DYSPLASIA OR MALIGNANCY IDENTIFIED. Microscopic Comment 1. Dr. Lyndon Code has seen the first specimen (cecal and ascending colon biopsy) in consultation with agreement. The  findings are called to [facility] who is taking the result for Dr. Wynetta Emery on 04/16/2017.  PROCEDURES  Colonoscopy 04/15/17 IMPRESSION - One 5 mm polyp in the proximal descending colon, removed with a cold snare. Resected and retrieved. - Tumor in the proximal ascending colon. Biopsied. - The examination was otherwise normal.  RADIOGRAPHIC STUDIES: I have personally reviewed the radiological images as listed and agreed with the findings in the report. Ct Shoulder Right Wo Contrast  Result Date: 01/03/2018 CLINICAL DATA:  Chronic right shoulder pain. Preoperative evaluation for replacement. EXAM: CT OF THE UPPER RIGHT EXTREMITY WITHOUT CONTRAST TECHNIQUE: Multidetector CT imaging of the upper right extremity was performed according to the standard protocol. COMPARISON:  None. FINDINGS: Bones/Joint/Cartilage The patient has severe glenohumeral osteoarthritis with bone-on-bone joint space narrowing, a large osteophyte off the humeral head and flattening and remodeling of the glenoid. Innumerable subchondral cysts are identified in the glenoid measuring up to 0.8 cm in diameter. Subchondral cysts are also seen in the humeral head measuring up to 1 cm in diameter. No acute bony abnormality is identified. The acromioclavicular joint appears normal. The acromion is type 1. Ligaments Suboptimally assessed by CT. Muscles and Tendons The rotator cuff appears intact. Musculature of the shoulder girdle is preserved. Soft tissues A few punctate calcified granulomata are seen in the right lung. IMPRESSION: Severe glenohumeral osteoarthritis as described. The rotator cuff appears intact.  No muscular atrophy. Normal acromioclavicular joint. Electronically Signed   By: Inge Rise M.D.   On: 01/03/2018 07:51     CT CAP W Contrast 04/25/17 IMPRESSION: Moderate size sliding-type hiatal hernia. Coronary artery calcifications are noted suggesting coronary artery disease. Scarring is noted in both lungs. 4 mm  nodule is noted in left lung apex. No follow-up needed if patient is low-risk. Non-contrast chest CT can be considered in 12 months if patient is high-risk. This recommendation follows the consensus statement: Guidelines for Management of Incidental Pulmonary Nodules Detected on CT Images: From the Fleischner Society 2017; Radiology 2017; 284:228-243. Small rounded low density seen peripherally in right hepatic lobe which most likely represent cyst,  but metastatic disease cannot be excluded given the history of colonic malignancy. Continued follow-up is recommended. 16 mm soft tissue density seen in the cecum which may represent neoplasm or malignancy, as noted on colonoscopy. Aortic atherosclerosis. Stable prostatic enlargement.  ASSESSMENT & PLAN:  Kevin Mathews is a 77 y.o. caucasian male with a history of GERD, HTN, and osteoarthritis with newly diagnosed stage I colon cancer s/p partial ileocolectomy on 06/12/2017.   1. Malignant neoplasm of ascending colon, pT2N0M0, stage I, G2 -We discussed his pathology results for his surgery which showed a stage I disease with no positive lymph nodes. No other high risk features, such as lymphovascular invasion, high-grade, etc.  -We discussed the findings from the CT scan in detail (see below), no evidence of distant metastasis, his and his wife's questions were answered to satisfaction. -His cancer was resected with negative margins on 06/12/17 -We reviewed his risk of recurrence after surgical resection. Giving the early stage disease, no high-risk features, his risk is low. I recommend him to have routine follow-up.  -Will proceed with cancer surveillance consisting of labs/CEA and physical exam every 6 months. Routine surveillance CT scan is probably not necessary giving the stage I disease without high-risk features. -He is due for repeat colonoscopy in May 2019. He is scheduled to see Dr. Nani Ravens -Labs reviewed, CBC and CMP WNL, His iron study  and CEA still pending.  -I encouraged him to become more active with exercise and maintain a healthy diet and try to lose some weight.  -Follow up in 6 months with lab   2. Low density lesion in liver -His CT scan showed a small rounded low density seen peripherally in right hepatic lobe most likely represents a cyst -After visit, I discussed case with Dr. Nyoka Cowden, Radiologist, who compared 04/2017 CT C/A/P with 2016 CT abdomen and MRI abdomen. He confirms the area in question is the same size and location as previously seen in 2016 and has not changed; confirms this is a cyst and does not require further testing.    3. Lung Nodule -4 mm left lung apex nodule on 04/25/17 CT Scan -He is low risk for lung cancer based on light smoking history  -It is likely benign, no need for follow-up a CT scan  4. Arthrosclerosis in his heart -Currently on Aspirin 81 mg daily and Lipitor 10 mg daily  -His last CT showed Coronary artery calcifications as well as aortic arthrosclerosis.  -I encouraged him to watch his cholesterol, diet and weight. If he develops significant chest pain he should contact his PCP and present to the ED.  -He will follow up with PCP   5. Anemia, probable iron deficient anemia  -Post-op anemia Hgb 9.9 on 06/13/17 -He began Ferrous sulfate in 07/2017 per his PCP, recently stopped due to normal iron level. -This has resolved now, his hemoglobin is normal today.  PLAN:  -Lab and f/u in 6 months    No orders of the defined types were placed in this encounter.   All questions were answered. The patient knows to call the clinic with any problems, questions or concerns. I spent 20 minutes counseling the patient face to face. The total time spent in the appointment was 25 minutes and more than 50% was on counseling.  This document serves as a record of services personally performed by Truitt Merle, MD. It was created on her behalf by Joslyn Devon, a trained medical scribe. The creation of  this record is based  on the scribe's personal observations and the provider's statements to them.   I have reviewed the above documentation for accuracy and completeness, and I agree with the above.     Truitt Merle, MD 01/30/2018 12:35 PM

## 2018-01-30 ENCOUNTER — Inpatient Hospital Stay: Payer: Medicare HMO

## 2018-01-30 ENCOUNTER — Telehealth: Payer: Self-pay

## 2018-01-30 ENCOUNTER — Encounter: Payer: Self-pay | Admitting: Hematology

## 2018-01-30 ENCOUNTER — Inpatient Hospital Stay: Payer: Medicare HMO | Attending: Hematology | Admitting: Hematology

## 2018-01-30 VITALS — BP 141/80 | HR 52 | Temp 98.2°F | Resp 20 | Ht 67.5 in | Wt 190.0 lb

## 2018-01-30 DIAGNOSIS — D649 Anemia, unspecified: Secondary | ICD-10-CM | POA: Insufficient documentation

## 2018-01-30 DIAGNOSIS — C182 Malignant neoplasm of ascending colon: Secondary | ICD-10-CM

## 2018-01-30 DIAGNOSIS — M25511 Pain in right shoulder: Secondary | ICD-10-CM | POA: Insufficient documentation

## 2018-01-30 DIAGNOSIS — G8929 Other chronic pain: Secondary | ICD-10-CM | POA: Insufficient documentation

## 2018-01-30 DIAGNOSIS — Z87891 Personal history of nicotine dependence: Secondary | ICD-10-CM | POA: Insufficient documentation

## 2018-01-30 DIAGNOSIS — I10 Essential (primary) hypertension: Secondary | ICD-10-CM

## 2018-01-30 LAB — IRON AND TIBC
Iron: 82 ug/dL (ref 42–163)
Saturation Ratios: 30 % — ABNORMAL LOW (ref 42–163)
TIBC: 270 ug/dL (ref 202–409)
UIBC: 188 ug/dL

## 2018-01-30 LAB — COMPREHENSIVE METABOLIC PANEL
ALT: 31 U/L (ref 0–55)
AST: 24 U/L (ref 5–34)
Albumin: 4 g/dL (ref 3.5–5.0)
Alkaline Phosphatase: 114 U/L (ref 40–150)
Anion gap: 10 (ref 3–11)
BILIRUBIN TOTAL: 0.7 mg/dL (ref 0.2–1.2)
BUN: 19 mg/dL (ref 7–26)
CO2: 26 mmol/L (ref 22–29)
Calcium: 9.7 mg/dL (ref 8.4–10.4)
Chloride: 105 mmol/L (ref 98–109)
Creatinine, Ser: 0.99 mg/dL (ref 0.70–1.30)
GFR calc Af Amer: >60 mL/min (ref >60)
Glucose, Bld: 102 mg/dL (ref 70–140)
Potassium: 3.8 mmol/L (ref 3.5–5.1)
Sodium: 141 mmol/L (ref 136–145)
TOTAL PROTEIN: 7.3 g/dL (ref 6.4–8.3)

## 2018-01-30 LAB — CBC WITH DIFFERENTIAL/PLATELET
BASOS ABS: 0 10*3/uL (ref 0.0–0.1)
Basophils Relative: 1 %
Eosinophils Absolute: 0.2 10*3/uL (ref 0.0–0.5)
Eosinophils Relative: 4 %
HEMATOCRIT: 44.1 % (ref 38.4–49.9)
Hemoglobin: 14.8 g/dL (ref 13.0–17.1)
LYMPHS PCT: 26 %
Lymphs Abs: 1.5 10*3/uL (ref 0.9–3.3)
MCH: 30.5 pg (ref 27.2–33.4)
MCHC: 33.5 g/dL (ref 32.0–36.0)
MCV: 91.2 fL (ref 79.3–98.0)
MONO ABS: 0.4 10*3/uL (ref 0.1–0.9)
Monocytes Relative: 7 %
Neutro Abs: 3.5 10*3/uL (ref 1.5–6.5)
Neutrophils Relative %: 62 %
Platelets: 197 10*3/uL (ref 140–400)
RBC: 4.83 MIL/uL (ref 4.20–5.82)
RDW: 13.9 % (ref 11.0–14.6)
WBC: 5.6 10*3/uL (ref 4.0–10.3)

## 2018-01-30 LAB — CEA (IN HOUSE-CHCC): CEA (CHCC-IN HOUSE): 1.23 ng/mL (ref 0.00–5.00)

## 2018-01-30 LAB — FERRITIN: FERRITIN: 78 ng/mL (ref 22–316)

## 2018-01-30 NOTE — Telephone Encounter (Signed)
Due to family vacation time F/U pushed back by 2 weeks per patient request date. Printed avs and calender of coming appojntment. Per 2/28 los

## 2018-02-07 ENCOUNTER — Telehealth: Payer: Self-pay | Admitting: *Deleted

## 2018-02-07 NOTE — Telephone Encounter (Signed)
Spoke with wife [name], and gave her pt's lab results as per her request, and ok per Dr. Burr Medico.   Wife voiced understanding.

## 2018-02-24 DIAGNOSIS — M19011 Primary osteoarthritis, right shoulder: Secondary | ICD-10-CM | POA: Diagnosis present

## 2018-02-24 NOTE — H&P (Addendum)
SHOULDER ARTHROPLASTY ADMISSION H&P  Patient ID: Kevin Mathews MRN: [id number] DOB/AGE: Nov 05, 1941 77 y.o.  Chief Complaint: right shoulder pain.  Planned Procedure Date: 03/18/2018 Medical and cardiac clearance by Dr. Laurann Montana Additional clearance by urology: Dr. Karsten Ro.  Oncology: Dr. Burr Medico  HPI: Kevin Mathews is a 77 y.o. male with a history of left TSA, BPH, hypertension, colon cancer status post ileocolectomy, GERD who presents for evaluation of OA RIGHT SHOULDER. The patient has a history of pain and functional disability in the right shoulder due to arthritis and has failed non-surgical conservative treatments for greater than 12 weeks to include NSAID's and/or analgesics, corticosteriod injections and activity modification.  Onset of symptoms was gradual, starting 2 years ago with gradually worsening course since that time.   Patient currently rates pain at 8 out of 10 with activity. Patient has night pain, worsening of pain with activity and weight bearing and pain that interferes with activities of daily living.  Patient has evidence of subchondral cysts, subchondral sclerosis, periarticular osteophytes and joint space narrowing by imaging studies.  There is no active infection.  Past Medical History:  Diagnosis Date  . Arthritis   . Dyspnea    with excertion  . Frequent urination   . GERD (gastroesophageal reflux disease)    occasional  . History of kidney stones   . Hypertension   . 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 COLONOSCOPY WITH PROPOFOL N/A 04/15/2017   Procedure: COLONOSCOPY WITH PROPOFOL;  Surgeon: Garlan Fair, MD;  Location: [facility];  Service: Endoscopy;  Laterality: N/A;  . LAPAROSCOPIC PARTIAL COLECTOMY N/A 06/12/2017   Procedure: LAPAROSCOPIC ASSISTED PARTIAL COLECTOMY;  Surgeon: Coralie Keens, MD;  Location: [facility];  Service: General;  Laterality: N/A;   . NASAL SINUS SURGERY  1979  . TOTAL SHOULDER ARTHROPLASTY Left 07/01/2013   Procedure: TOTAL SHOULDER ARTHROPLASTY;  Surgeon: Ninetta Lights, MD;  Location: [facility];  Service: Orthopedics;  Laterality: Left;  with interscalene block   No Known Allergies   Medications: Doxazosin 4 mg daily Finasteride 5 mg daily Atorvastatin 10 mg daily Dexilant as needed GERD Ibuprofen 400 mg OTC as needed Adult multivitamin daily Aspirin 81 mg daily Krill oil supplement daily  Social history:  Married remote former smoker-a few cigarettes per day for 3 years.  Quit 1970.  No EtOH.  Family history: Father with HTN.  ROS: Currently denies lightheadedness, dizziness, Fever, chills, CP, SOB.   No personal history of DVT, PE, MI, or CVA. No loose teeth or dentures All other systems have been reviewed and were otherwise currently negative with the exception of those mentioned in the HPI and as above.  Objective: Vitals: Ht: 5 feet 8 inches wt: 187 temp: 98 BP: 106/66 pulse: 56 O2 97 % on room air.   Physical Exam: General: Alert, NAD.   HEENT: EOMI, Good Neck Extension  Pulm: No increased work of breathing.  Clear B/L A/P w/o crackle or wheeze.  CV: RRR, No m/g/r appreciated  GI: soft, NT, ND Neuro: Neuro without gross focal deficit.  Sensation intact distally Skin: No lesions in the area of chief complaint MSK/Surgical Site: left shoulder pain with range of motion.  Forward flexion/abduction approximately 110 on the right versus 140 on the left.  Internal rotation to L5.  External rotation to 25 degrees.  No AC pain.  No biceps pain.  Decreased rotator cuff strength.  NVI distally.  Resting tremor left hand.  Imaging Review Plain radiographs demonstrate severe degenerative joint disease of the left shoulder.  CT also shows severe glenohumeral osteoarthritis.  Rotator cuff appears to be intact in  CT.  Assessment: OA RIGHT SHOULDER Principal Problem:   Primary osteoarthritis, right  shoulder Active Problems:   Hypertension   GERD (gastroesophageal reflux disease)   Plan: Plan for Procedure(s): RIGHT TOTAL SHOULDER ARTHROPLASTY  The patient history, physical exam, clinical judgement of the provider and imaging are consistent with end stage degenerative joint disease and total versus reverse total shoulder joint arthroplasty is deemed medically necessary. The treatment options including medical management, injection therapy, and arthroplasty were discussed at length. The risks and benefits of Procedure(s): RIGHT TOTAL SHOULDER ARTHROPLASTY were presented and reviewed.  The risks of nonoperative treatment, versus surgical intervention including but not limited to continued pain, aseptic loosening, stiffness, dislocation/subluxation, infection, bleeding, nerve injury, blood clots, cardiopulmonary complications, morbidity, mortality, among others were discussed. The patient verbalizes understanding and wishes to proceed with the plan.  Patient is being admitted for inpatient treatment for surgery, pain control, PT, OT, prophylactic antibiotics, VTE prophylaxis, progressive ambulation, ADL's and discharge planning.   Dental prophylaxis discussed and recommended for 2 years postoperatively.   The patient does meet the criteria for TXA which will be used perioperatively.    Resume 81 mg aspirin postoperatively addition to SCDs, and early ambulation for DVT prophylaxis.  He will resume ibuprofen as needed postoperatively with Dexilant for gastric protection/GERD.  Plan for oxycodone for breakthrough pain.  The patient is planning to be discharged with outpatient physical therapy in care of his wife.   Patient's anticipated LOS is less than 2 midnights, meeting these requirements: - Lives within 1 hour of care - Has a competent adult at home to recover with post-op recover - NO history of  - Chronic pain requiring opiods  - Diabetes  - Coronary Artery Disease  - Heart  failure  - Heart attack  - Stroke  - DVT/VTE  - Cardiac arrhythmia  - Respiratory Failure/COPD  - Renal failure  - Anemia  - Advanced Liver disease    Prudencio Burly III, PA-C 02/24/2018 4:10 PM

## 2018-03-06 NOTE — Pre-Procedure Instructions (Signed)
Kevin Mathews  03/06/2018      [facility] [address] [place], [place] [address] Phone: [phone] Fax: [phone]    Your procedure is scheduled on April 16  Report to [place] at [place].M.  Call this number if you have problems the morning of surgery:  [phone]   Remember:  Do not eat food or drink liquids after midnight. Continue all medications as directed by your physician except follow these medication instructions before surgery below   Take these medicines the morning of surgery with A SIP OF WATER  dexlansoprazole (DEXILANT)   7 days prior to surgery STOP taking any Aspirin(unless otherwise instructed by your surgeon), Aleve, Naproxen, Ibuprofen, Motrin, Advil, Goody's, BC's, all herbal medications, fish oil, and all vitamins     Do not wear jewelry  Do not wear lotions, powders, or cologne, or deodorant.  Men may shave face and neck.  Do not bring valuables to the hospital.  [facility] is not responsible for any belongings or valuables.  Contacts, dentures or bridgework may not be worn into surgery.  Leave your suitcase in the car.  After surgery it may be brought to your room.  For patients admitted to the hospital, discharge time will be determined by your treatment team.  Patients discharged the day of surgery will not be allowed to drive home.    Special instructions:   [place]- Preparing For Surgery  Before surgery, you can play an important role. Because skin is not sterile, your skin needs to be as free of germs as possible. You can reduce the number of germs on your skin by washing with CHG (chlorahexidine gluconate) Soap before surgery.  CHG is an antiseptic cleaner which kills germs and bonds with the skin to continue killing germs even after washing.  Please do not use if you have an allergy to CHG or antibacterial soaps. If your skin becomes  reddened/irritated stop using the CHG.  Do not shave (including legs and underarms) for at least 48 hours prior to first CHG shower. It is OK to shave your face.  Please follow these instructions carefully.   1. Shower the NIGHT BEFORE SURGERY and the MORNING OF SURGERY with CHG.   2. If you chose to wash your hair, wash your hair first as usual with your normal shampoo.  3. After you shampoo, rinse your hair and body thoroughly to remove the shampoo.  4. Use CHG as you would any other liquid soap. You can apply CHG directly to the skin and wash gently with a scrungie or a clean washcloth.   5. Apply the CHG Soap to your body ONLY FROM THE NECK DOWN.  Do not use on open wounds or open sores. Avoid contact with your eyes, ears, mouth and genitals (private parts). Wash Face and genitals (private parts)  with your normal soap.  6. Wash thoroughly, paying special attention to the area where your surgery will be performed.  7. Thoroughly rinse your body with warm water from the neck down.  8. DO NOT shower/wash with your normal soap after using and rinsing off the CHG Soap.  9. Pat yourself dry with a CLEAN TOWEL.  10. Wear CLEAN PAJAMAS to bed the night before surgery, wear comfortable clothes the morning of surgery  11. Place CLEAN SHEETS on your bed the night of your first shower and DO NOT SLEEP WITH  PETS.    Day of Surgery: Do not apply any deodorants/lotions. Please wear clean clothes to the hospital/surgery center.      Please read over the following fact sheets that you were given.

## 2018-03-07 ENCOUNTER — Other Ambulatory Visit: Payer: Self-pay

## 2018-03-07 ENCOUNTER — Encounter (HOSPITAL_COMMUNITY)
Admission: RE | Admit: 2018-03-07 | Discharge: 2018-03-07 | Disposition: A | Discharge status: Home / Self Care | Payer: Medicare HMO | Source: Ambulatory Visit | Attending: Orthopedic Surgery | Admitting: Orthopedic Surgery

## 2018-03-07 ENCOUNTER — Encounter (HOSPITAL_COMMUNITY): Payer: Self-pay

## 2018-03-07 DIAGNOSIS — M19011 Primary osteoarthritis, right shoulder: Secondary | ICD-10-CM | POA: Diagnosis present

## 2018-03-07 HISTORY — DX: Benign prostatic hyperplasia without lower urinary tract symptoms: N40.0

## 2018-03-07 LAB — BASIC METABOLIC PANEL
ANION GAP: 11 (ref 5–15)
BUN: 21 mg/dL — ABNORMAL HIGH (ref 6–20)
CO2: 23 mmol/L (ref 22–32)
Calcium: 9.5 mg/dL (ref 8.9–10.3)
Chloride: 105 mmol/L (ref 101–111)
Creatinine, Ser: 1.03 mg/dL (ref 0.61–1.24)
Glucose, Bld: 106 mg/dL — ABNORMAL HIGH (ref 65–99)
POTASSIUM: 4.2 mmol/L (ref 3.5–5.1)
SODIUM: 139 mmol/L (ref 135–145)

## 2018-03-07 LAB — CBC
HEMATOCRIT: 44.8 % (ref 39.0–52.0)
HEMOGLOBIN: 14.8 g/dL (ref 13.0–17.0)
MCH: 30.6 pg (ref 26.0–34.0)
MCHC: 33 g/dL (ref 30.0–36.0)
MCV: 92.8 fL (ref 78.0–100.0)
Platelets: 233 10*3/uL (ref 150–400)
RBC: 4.83 MIL/uL (ref 4.22–5.81)
RDW: 13.3 % (ref 11.5–15.5)
WBC: 7.6 10*3/uL (ref 4.0–10.5)

## 2018-03-07 LAB — SURGICAL PCR SCREEN
MRSA, PCR: NEGATIVE
STAPHYLOCOCCUS AUREUS: NEGATIVE

## 2018-03-07 NOTE — Pre-Procedure Instructions (Signed)
SHALOM MCGUINESS  03/07/2018     Your procedure is scheduled on April 16.  Report to [facility] Admitting at [place].M.                Your surgery or procedure is scheduled for 7:30 AM   Call this number if you have problems the morning of [phone]- pre-op desk     For any other questions, please call [phone], Monday - Friday 8 AM - 4 PM.- PAT desk- ask for any nurse.   Remember:  Do not eat food or drink liquids after midnight Sunday, April 14.  Continue all medications as directed by your physician except follow these medication instructions before surgery below   Take these medicines the morning of surgery with A SIP OF WATER  dexlansoprazole (DEXILANT)          7 days prior to surgery STOP taking any Aspirin(unless otherwise instructed by your surgeon), Aleve, Naproxen, Ibuprofen, Motrin, Advil, Goody's, BC's, all herbal medications, fish oil, and all vitamins  Patients discharged the day of surgery will not be allowed to drive home.   Special instructions:   [place]- Preparing For Surgery  Before surgery, you can play an important role. Because skin is not sterile, your skin needs to be as free of germs as possible. You can reduce the number of germs on your skin by washing with CHG (chlorahexidine gluconate) Soap before surgery.  CHG is an antiseptic cleaner which kills germs and bonds with the skin to continue killing germs even after washing.  Please do not use if you have an allergy to CHG or antibacterial soaps. If your skin becomes reddened/irritated stop using the CHG.  Do not shave (including legs and underarms) for at least 48 hours prior to first CHG shower. It is OK to shave your face.  Please follow these instructions carefully.   1. Shower the NIGHT BEFORE SURGERY and the MORNING OF SURGERY with CHG.   2. If you chose to wash your hair, wash your hair first as usual with your normal shampoo.                 Wash your face, head  and private areas with your normal soap, then rinse. 3. After you shampoo, rinse your hair and body thoroughly to remove the shampoo.  4. Use CHG as you would any other liquid soap. You can apply CHG directly to the skin and wash gently with a scrungie or a clean washcloth.   5. Apply the CHG Soap to your body ONLY FROM THE NECK DOWN.  Do not use on open wounds or open sores. Avoid contact with your eyes, ears, mouth and genitals (private parts).   6. Wash thoroughly, paying special attention to the area where your surgery will be performed.  7. Thoroughly rinse your body with warm water from the neck down.  8. DO NOT shower/wash with your normal soap after using and rinsing off the CHG Soap.  9. Pat yourself dry with a CLEAN TOWEL.  10. Wear CLEAN PAJAMAS to bed the night before surgery, wear comfortable clothes the morning of surgery  11. Place CLEAN SHEETS on your bed the night of your first shower and DO NOT SLEEP WITH PETS.  Day of Surgery: Shower as above. Do not apply any deodorants/lotions. Please wear clean clothes to the hospital/surgery center.               Do not  wear lotions, powders, or cologne, or deodorant.  Men may shave face and neck.  Do not bring valuables to the hospital.  [facility] is not responsible for any belongings or valuables.  Contacts, dentures or bridgework may not be worn into surgery.  Leave your suitcase in the car.  After surgery it may be brought to your room.  For patients admitted to the hospital, discharge time will be determined by your treatment team.  Patients discharged the day of surgery will not be allowed to drive home.    Please read over the following fact sheets that you were given.

## 2018-03-08 LAB — URINE CULTURE: Culture: NO GROWTH

## 2018-03-11 DIAGNOSIS — Z85038 Personal history of other malignant neoplasm of large intestine: Secondary | ICD-10-CM | POA: Diagnosis not present

## 2018-03-11 DIAGNOSIS — K649 Unspecified hemorrhoids: Secondary | ICD-10-CM | POA: Diagnosis not present

## 2018-03-11 DIAGNOSIS — K5730 Diverticulosis of large intestine without perforation or abscess without bleeding: Secondary | ICD-10-CM | POA: Diagnosis not present

## 2018-03-13 DIAGNOSIS — Z8249 Family history of ischemic heart disease and other diseases of the circulatory system: Secondary | ICD-10-CM | POA: Diagnosis not present

## 2018-03-13 DIAGNOSIS — N529 Male erectile dysfunction, unspecified: Secondary | ICD-10-CM | POA: Diagnosis not present

## 2018-03-13 DIAGNOSIS — N400 Enlarged prostate without lower urinary tract symptoms: Secondary | ICD-10-CM | POA: Diagnosis not present

## 2018-03-13 DIAGNOSIS — Z7982 Long term (current) use of aspirin: Secondary | ICD-10-CM | POA: Diagnosis not present

## 2018-03-13 DIAGNOSIS — I10 Essential (primary) hypertension: Secondary | ICD-10-CM | POA: Diagnosis not present

## 2018-03-13 DIAGNOSIS — G8929 Other chronic pain: Secondary | ICD-10-CM | POA: Diagnosis not present

## 2018-03-13 DIAGNOSIS — K219 Gastro-esophageal reflux disease without esophagitis: Secondary | ICD-10-CM | POA: Diagnosis not present

## 2018-03-13 DIAGNOSIS — R32 Unspecified urinary incontinence: Secondary | ICD-10-CM | POA: Diagnosis not present

## 2018-03-13 DIAGNOSIS — E785 Hyperlipidemia, unspecified: Secondary | ICD-10-CM | POA: Diagnosis not present

## 2018-03-13 DIAGNOSIS — J302 Other seasonal allergic rhinitis: Secondary | ICD-10-CM | POA: Diagnosis not present

## 2018-03-17 MED ORDER — SODIUM CHLORIDE 0.9 % IV SOLN
1000.0000 mg | INTRAVENOUS | Status: AC
  Administered 2018-03-18: 1000 mg via INTRAVENOUS
  Filled 2018-03-17: qty 1100

## 2018-03-18 ENCOUNTER — Encounter (HOSPITAL_COMMUNITY): Payer: Self-pay | Admitting: *Deleted

## 2018-03-18 ENCOUNTER — Inpatient Hospital Stay (HOSPITAL_COMMUNITY): Payer: Medicare HMO

## 2018-03-18 ENCOUNTER — Inpatient Hospital Stay (HOSPITAL_COMMUNITY): Payer: Medicare HMO | Admitting: Anesthesiology

## 2018-03-18 ENCOUNTER — Encounter (HOSPITAL_COMMUNITY)
Admission: RE | Disposition: A | Discharge status: Home / Self Care | Payer: Self-pay | Source: Ambulatory Visit | Attending: Orthopedic Surgery

## 2018-03-18 ENCOUNTER — Observation Stay (HOSPITAL_COMMUNITY)
Admission: RE | Admit: 2018-03-18 | Discharge: 2018-03-19 | Disposition: A | Discharge status: Home / Self Care | Payer: Medicare HMO | Source: Ambulatory Visit | Attending: Orthopedic Surgery | Admitting: Orthopedic Surgery

## 2018-03-18 DIAGNOSIS — M19011 Primary osteoarthritis, right shoulder: Secondary | ICD-10-CM | POA: Diagnosis not present

## 2018-03-18 DIAGNOSIS — R350 Frequency of micturition: Secondary | ICD-10-CM | POA: Insufficient documentation

## 2018-03-18 DIAGNOSIS — Z87891 Personal history of nicotine dependence: Secondary | ICD-10-CM | POA: Diagnosis not present

## 2018-03-18 DIAGNOSIS — M19019 Primary osteoarthritis, unspecified shoulder: Secondary | ICD-10-CM | POA: Diagnosis present

## 2018-03-18 DIAGNOSIS — Z96611 Presence of right artificial shoulder joint: Secondary | ICD-10-CM

## 2018-03-18 DIAGNOSIS — G8918 Other acute postprocedural pain: Secondary | ICD-10-CM | POA: Diagnosis not present

## 2018-03-18 DIAGNOSIS — Z96612 Presence of left artificial shoulder joint: Secondary | ICD-10-CM | POA: Diagnosis not present

## 2018-03-18 DIAGNOSIS — S46211A Strain of muscle, fascia and tendon of other parts of biceps, right arm, initial encounter: Secondary | ICD-10-CM | POA: Diagnosis not present

## 2018-03-18 DIAGNOSIS — Z7982 Long term (current) use of aspirin: Secondary | ICD-10-CM | POA: Insufficient documentation

## 2018-03-18 DIAGNOSIS — M25511 Pain in right shoulder: Secondary | ICD-10-CM | POA: Diagnosis present

## 2018-03-18 DIAGNOSIS — Z79899 Other long term (current) drug therapy: Secondary | ICD-10-CM | POA: Diagnosis not present

## 2018-03-18 DIAGNOSIS — M25711 Osteophyte, right shoulder: Secondary | ICD-10-CM | POA: Diagnosis not present

## 2018-03-18 DIAGNOSIS — I10 Essential (primary) hypertension: Secondary | ICD-10-CM | POA: Diagnosis present

## 2018-03-18 DIAGNOSIS — Z471 Aftercare following joint replacement surgery: Secondary | ICD-10-CM | POA: Diagnosis not present

## 2018-03-18 DIAGNOSIS — K219 Gastro-esophageal reflux disease without esophagitis: Secondary | ICD-10-CM | POA: Diagnosis present

## 2018-03-18 DIAGNOSIS — Z9049 Acquired absence of other specified parts of digestive tract: Secondary | ICD-10-CM | POA: Diagnosis not present

## 2018-03-18 DIAGNOSIS — Z85038 Personal history of other malignant neoplasm of large intestine: Secondary | ICD-10-CM | POA: Insufficient documentation

## 2018-03-18 DIAGNOSIS — M7521 Bicipital tendinitis, right shoulder: Secondary | ICD-10-CM | POA: Diagnosis not present

## 2018-03-18 HISTORY — PX: TOTAL SHOULDER ARTHROPLASTY: SHX126

## 2018-03-18 SURGERY — ARTHROPLASTY, SHOULDER, TOTAL
Anesthesia: General | Site: Shoulder | Laterality: Right

## 2018-03-18 MED ORDER — METOCLOPRAMIDE HCL 5 MG/ML IJ SOLN: 5.0000 mg | Freq: Three times a day (TID) | INTRAMUSCULAR | Status: DC | PRN

## 2018-03-18 MED ORDER — GABAPENTIN 300 MG PO CAPS
300.0000 mg | ORAL_CAPSULE | Freq: Once | ORAL | Status: AC
  Administered 2018-03-18: 300 mg via ORAL
  Filled 2018-03-18: qty 1

## 2018-03-18 MED ORDER — MEPERIDINE HCL 50 MG/ML IJ SOLN: 6.2500 mg | INTRAMUSCULAR | Status: DC | PRN

## 2018-03-18 MED ORDER — PANTOPRAZOLE SODIUM 40 MG PO TBEC: 40.0000 mg | DELAYED_RELEASE_TABLET | Freq: Every day | ORAL | Status: DC

## 2018-03-18 MED ORDER — ROCURONIUM BROMIDE 50 MG/5ML IV SOSY
PREFILLED_SYRINGE | INTRAVENOUS | Status: DC | PRN
  Administered 2018-03-18: 50 mg via INTRAVENOUS
  Administered 2018-03-18: 20 mg via INTRAVENOUS

## 2018-03-18 MED ORDER — BUPIVACAINE LIPOSOME 1.3 % IJ SUSP
20.0000 mL | Freq: Once | INTRAMUSCULAR | Status: AC
  Administered 2018-03-18: 20 mL
  Filled 2018-03-18: qty 20

## 2018-03-18 MED ORDER — ACETAMINOPHEN 325 MG PO TABS: 325.0000 mg | ORAL_TABLET | Freq: Four times a day (QID) | ORAL | Status: DC | PRN

## 2018-03-18 MED ORDER — SUGAMMADEX SODIUM 200 MG/2ML IV SOLN
INTRAVENOUS | Status: DC | PRN
  Administered 2018-03-18: 200 mg via INTRAVENOUS

## 2018-03-18 MED ORDER — OXYCODONE HCL 5 MG/5ML PO SOLN: 5.0000 mg | Freq: Once | ORAL | Status: DC | PRN

## 2018-03-18 MED ORDER — METHOCARBAMOL 1000 MG/10ML IJ SOLN
500.0000 mg | Freq: Four times a day (QID) | INTRAVENOUS | Status: DC | PRN
  Filled 2018-03-18: qty 5

## 2018-03-18 MED ORDER — LACTATED RINGERS IV SOLN
INTRAVENOUS | Status: DC
  Administered 2018-03-18 (×2): via INTRAVENOUS

## 2018-03-18 MED ORDER — METHOCARBAMOL 500 MG PO TABS
500.0000 mg | ORAL_TABLET | Freq: Four times a day (QID) | ORAL | Status: DC | PRN
  Administered 2018-03-18 – 2018-03-19 (×2): 500 mg via ORAL
  Filled 2018-03-18 (×2): qty 1

## 2018-03-18 MED ORDER — ASPIRIN EC 325 MG PO TBEC
325.0000 mg | DELAYED_RELEASE_TABLET | Freq: Every day | ORAL | Status: DC
  Administered 2018-03-18 – 2018-03-19 (×2): 325 mg via ORAL
  Filled 2018-03-18: qty 1

## 2018-03-18 MED ORDER — OXYCODONE HCL 5 MG PO TABS: 5.0000 mg | ORAL_TABLET | Freq: Once | ORAL | Status: DC | PRN

## 2018-03-18 MED ORDER — METOCLOPRAMIDE HCL 5 MG PO TABS: 5.0000 mg | ORAL_TABLET | Freq: Three times a day (TID) | ORAL | Status: DC | PRN

## 2018-03-18 MED ORDER — DEXAMETHASONE SODIUM PHOSPHATE 10 MG/ML IJ SOLN
INTRAMUSCULAR | Status: DC | PRN
  Administered 2018-03-18: 10 mg via INTRAVENOUS

## 2018-03-18 MED ORDER — DOXAZOSIN MESYLATE 4 MG PO TABS
4.0000 mg | ORAL_TABLET | Freq: Every evening | ORAL | Status: DC
  Administered 2018-03-18: 4 mg via ORAL
  Filled 2018-03-18 (×2): qty 1

## 2018-03-18 MED ORDER — LACTATED RINGERS IV SOLN: INTRAVENOUS | Status: DC

## 2018-03-18 MED ORDER — DEXAMETHASONE SODIUM PHOSPHATE 10 MG/ML IJ SOLN
INTRAMUSCULAR | Status: AC
  Filled 2018-03-18: qty 1

## 2018-03-18 MED ORDER — MENTHOL 3 MG MT LOZG: 1.0000 | LOZENGE | OROMUCOSAL | Status: DC | PRN

## 2018-03-18 MED ORDER — OXYCODONE HCL 5 MG PO TABS
5.0000 mg | ORAL_TABLET | ORAL | Status: DC | PRN
  Administered 2018-03-18: 5 mg via ORAL
  Administered 2018-03-19: 10 mg via ORAL
  Filled 2018-03-18: qty 2
  Filled 2018-03-18: qty 1

## 2018-03-18 MED ORDER — 0.9 % SODIUM CHLORIDE (POUR BTL) OPTIME
TOPICAL | Status: DC | PRN
  Administered 2018-03-18: 1000 mL

## 2018-03-18 MED ORDER — OXYCODONE HCL 5 MG PO TABS: 5.0000 mg | ORAL_TABLET | ORAL | 0 refills | Status: AC | PRN

## 2018-03-18 MED ORDER — LIDOCAINE 2% (20 MG/ML) 5 ML SYRINGE
INTRAMUSCULAR | Status: DC | PRN
  Administered 2018-03-18: 100 mg via INTRAVENOUS

## 2018-03-18 MED ORDER — FENTANYL CITRATE (PF) 100 MCG/2ML IJ SOLN
INTRAMUSCULAR | Status: DC | PRN
  Administered 2018-03-18: 25 ug via INTRAVENOUS
  Administered 2018-03-18: 50 ug via INTRAVENOUS

## 2018-03-18 MED ORDER — GLYCOPYRROLATE 0.2 MG/ML IV SOSY
PREFILLED_SYRINGE | INTRAVENOUS | Status: DC | PRN
  Administered 2018-03-18: .2 mg via INTRAVENOUS

## 2018-03-18 MED ORDER — PHENYLEPHRINE HCL 10 MG/ML IJ SOLN
INTRAVENOUS | Status: DC | PRN
  Administered 2018-03-18: 40 ug/min via INTRAVENOUS

## 2018-03-18 MED ORDER — HYDROMORPHONE HCL 2 MG/ML IJ SOLN: 0.2500 mg | INTRAMUSCULAR | Status: DC | PRN

## 2018-03-18 MED ORDER — HYDROMORPHONE HCL 1 MG/ML IJ SOLN: 0.5000 mg | INTRAMUSCULAR | Status: DC | PRN

## 2018-03-18 MED ORDER — SODIUM CHLORIDE 0.9% FLUSH
INTRAVENOUS | Status: DC | PRN
  Administered 2018-03-18: 30 mL

## 2018-03-18 MED ORDER — ONDANSETRON HCL 4 MG/2ML IJ SOLN
INTRAMUSCULAR | Status: DC | PRN
  Administered 2018-03-18: 4 mg via INTRAVENOUS

## 2018-03-18 MED ORDER — ACETAMINOPHEN 500 MG PO TABS
1000.0000 mg | ORAL_TABLET | Freq: Once | ORAL | Status: AC
  Administered 2018-03-18: 1000 mg via ORAL
  Filled 2018-03-18: qty 2

## 2018-03-18 MED ORDER — ROCURONIUM BROMIDE 10 MG/ML (PF) SYRINGE
PREFILLED_SYRINGE | INTRAVENOUS | Status: AC
  Filled 2018-03-18: qty 5

## 2018-03-18 MED ORDER — IBUPROFEN 600 MG PO TABS
600.0000 mg | ORAL_TABLET | Freq: Three times a day (TID) | ORAL | Status: DC
  Administered 2018-03-19: 600 mg via ORAL
  Filled 2018-03-18: qty 1
  Filled 2018-03-18: qty 3
  Filled 2018-03-18 (×2): qty 1
  Filled 2018-03-18: qty 3
  Filled 2018-03-18: qty 1

## 2018-03-18 MED ORDER — CHLORHEXIDINE GLUCONATE 4 % EX LIQD: 60.0000 mL | Freq: Once | CUTANEOUS | Status: DC

## 2018-03-18 MED ORDER — CEFAZOLIN SODIUM-DEXTROSE 2-4 GM/100ML-% IV SOLN
2.0000 g | INTRAVENOUS | Status: AC
  Administered 2018-03-18: 2 g via INTRAVENOUS
  Filled 2018-03-18: qty 100

## 2018-03-18 MED ORDER — ROPIVACAINE HCL 5 MG/ML IJ SOLN
INTRAMUSCULAR | Status: DC | PRN
  Administered 2018-03-18: 30 mL via PERINEURAL

## 2018-03-18 MED ORDER — PHENYLEPHRINE 40 MCG/ML (10ML) SYRINGE FOR IV PUSH (FOR BLOOD PRESSURE SUPPORT)
PREFILLED_SYRINGE | INTRAVENOUS | Status: DC | PRN
  Administered 2018-03-18: 40 ug via INTRAVENOUS
  Administered 2018-03-18 (×3): 80 ug via INTRAVENOUS

## 2018-03-18 MED ORDER — ONDANSETRON HCL 4 MG PO TABS: 4.0000 mg | ORAL_TABLET | Freq: Three times a day (TID) | ORAL | 0 refills | Status: DC | PRN

## 2018-03-18 MED ORDER — PHENYLEPHRINE 40 MCG/ML (10ML) SYRINGE FOR IV PUSH (FOR BLOOD PRESSURE SUPPORT)
PREFILLED_SYRINGE | INTRAVENOUS | Status: AC
  Filled 2018-03-18: qty 10

## 2018-03-18 MED ORDER — POVIDONE-IODINE 10 % EX SWAB: 2.0000 "application " | Freq: Once | CUTANEOUS | Status: DC

## 2018-03-18 MED ORDER — METHOCARBAMOL 500 MG PO TABS: 500.0000 mg | ORAL_TABLET | Freq: Four times a day (QID) | ORAL | 0 refills | Status: DC | PRN

## 2018-03-18 MED ORDER — FINASTERIDE 5 MG PO TABS
5.0000 mg | ORAL_TABLET | Freq: Every evening | ORAL | Status: DC
  Administered 2018-03-18: 5 mg via ORAL
  Filled 2018-03-18: qty 1

## 2018-03-18 MED ORDER — PROMETHAZINE HCL 25 MG/ML IJ SOLN: 6.2500 mg | INTRAMUSCULAR | Status: DC | PRN

## 2018-03-18 MED ORDER — ACETAMINOPHEN 500 MG PO TABS: 1000.0000 mg | ORAL_TABLET | Freq: Three times a day (TID) | ORAL | 0 refills | Status: AC

## 2018-03-18 MED ORDER — PROPOFOL 10 MG/ML IV BOLUS
INTRAVENOUS | Status: AC
  Filled 2018-03-18: qty 20

## 2018-03-18 MED ORDER — MIDAZOLAM HCL 2 MG/2ML IJ SOLN
INTRAMUSCULAR | Status: DC | PRN
  Administered 2018-03-18 (×2): 1 mg via INTRAVENOUS

## 2018-03-18 MED ORDER — PHENOL 1.4 % MT LIQD: 1.0000 | OROMUCOSAL | Status: DC | PRN

## 2018-03-18 MED ORDER — MIDAZOLAM HCL 2 MG/2ML IJ SOLN
INTRAMUSCULAR | Status: AC
  Filled 2018-03-18: qty 2

## 2018-03-18 MED ORDER — SUGAMMADEX SODIUM 200 MG/2ML IV SOLN
INTRAVENOUS | Status: AC
  Filled 2018-03-18: qty 2

## 2018-03-18 MED ORDER — DOCUSATE SODIUM 100 MG PO CAPS
100.0000 mg | ORAL_CAPSULE | Freq: Two times a day (BID) | ORAL | Status: DC
  Administered 2018-03-18 (×2): 100 mg via ORAL
  Filled 2018-03-18 (×2): qty 1

## 2018-03-18 MED ORDER — HYDROCHLOROTHIAZIDE 12.5 MG PO CAPS: 12.5000 mg | ORAL_CAPSULE | Freq: Every evening | ORAL | Status: DC

## 2018-03-18 MED ORDER — ONDANSETRON HCL 4 MG PO TABS: 4.0000 mg | ORAL_TABLET | Freq: Four times a day (QID) | ORAL | Status: DC | PRN

## 2018-03-18 MED ORDER — FENTANYL CITRATE (PF) 250 MCG/5ML IJ SOLN
INTRAMUSCULAR | Status: AC
  Filled 2018-03-18: qty 5

## 2018-03-18 MED ORDER — ONDANSETRON HCL 4 MG/2ML IJ SOLN: 4.0000 mg | Freq: Four times a day (QID) | INTRAMUSCULAR | Status: DC | PRN

## 2018-03-18 MED ORDER — LIDOCAINE 2% (20 MG/ML) 5 ML SYRINGE
INTRAMUSCULAR | Status: AC
  Filled 2018-03-18: qty 5

## 2018-03-18 MED ORDER — ATORVASTATIN CALCIUM 10 MG PO TABS
10.0000 mg | ORAL_TABLET | Freq: Every evening | ORAL | Status: DC
  Administered 2018-03-18: 10 mg via ORAL
  Filled 2018-03-18 (×2): qty 1

## 2018-03-18 MED ORDER — CEFAZOLIN SODIUM-DEXTROSE 1-4 GM/50ML-% IV SOLN
1.0000 g | Freq: Four times a day (QID) | INTRAVENOUS | Status: AC
  Administered 2018-03-18 – 2018-03-19 (×3): 1 g via INTRAVENOUS
  Filled 2018-03-18 (×3): qty 50

## 2018-03-18 MED ORDER — PROPOFOL 10 MG/ML IV BOLUS
INTRAVENOUS | Status: DC | PRN
  Administered 2018-03-18: 130 mg via INTRAVENOUS

## 2018-03-18 MED ORDER — POLYETHYLENE GLYCOL 3350 17 G PO PACK: 17.0000 g | PACK | Freq: Every day | ORAL | Status: DC | PRN

## 2018-03-18 MED ORDER — ONDANSETRON HCL 4 MG/2ML IJ SOLN
INTRAMUSCULAR | Status: AC
  Filled 2018-03-18: qty 2

## 2018-03-18 MED ORDER — DOCUSATE SODIUM 100 MG PO CAPS: 100.0000 mg | ORAL_CAPSULE | Freq: Two times a day (BID) | ORAL | 0 refills | Status: DC

## 2018-03-18 MED ORDER — ACETAMINOPHEN 500 MG PO TABS
1000.0000 mg | ORAL_TABLET | Freq: Three times a day (TID) | ORAL | Status: AC
  Administered 2018-03-18 (×2): 1000 mg via ORAL
  Filled 2018-03-18 (×2): qty 2

## 2018-03-18 SURGICAL SUPPLY — 68 items
BIT DRILL QUICK RELEASE PRPHRL (DRILL) ×3 IMPLANT
BLADE SAW SAG 29X58X.64 (BLADE) IMPLANT
BLADE SAW SAG 73X25 THK (BLADE) ×1
BLADE SAW SGTL 73X25 THK (BLADE) ×1 IMPLANT
CEMENT BONE R 1X40 (Cement) ×2 IMPLANT
CHLORAPREP W/TINT 26ML (MISCELLANEOUS) ×2 IMPLANT
CLSR STERI-STRIP ANTIMIC 1/2X4 (GAUZE/BANDAGES/DRESSINGS) ×2 IMPLANT
COVER SURGICAL LIGHT HANDLE (MISCELLANEOUS) ×2 IMPLANT
DRAPE INCISE IOBAN 66X45 STRL (DRAPES) ×2 IMPLANT
DRAPE U-SHAPE 47X51 STRL (DRAPES) ×2 IMPLANT
DRILL QUICK RELEASE PERIPHERAL (DRILL) ×6
DRSG ADAPTIC 3X8 NADH LF (GAUZE/BANDAGES/DRESSINGS) ×2 IMPLANT
DRSG PAD ABDOMINAL 8X10 ST (GAUZE/BANDAGES/DRESSINGS) ×4 IMPLANT
ELECT REM PT RETURN 9FT ADLT (ELECTROSURGICAL) ×2
ELECTRODE REM PT RTRN 9FT ADLT (ELECTROSURGICAL) ×1 IMPLANT
GAUZE SPONGE 4X4 12PLY STRL (GAUZE/BANDAGES/DRESSINGS) ×2 IMPLANT
GLENOID PEGGED STRL MED 4MM (Orthopedic Implant) ×2 IMPLANT
GLENOID POST REGENREX HYBRID (Orthopedic Implant) ×2 IMPLANT
GLOVE BIO SURGEON STRL SZ7.5 (GLOVE) ×4 IMPLANT
GLOVE BIOGEL PI IND STRL 7.0 (GLOVE) ×1 IMPLANT
GLOVE BIOGEL PI IND STRL 8 (GLOVE) ×2 IMPLANT
GLOVE BIOGEL PI INDICATOR 7.0 (GLOVE) ×1
GLOVE BIOGEL PI INDICATOR 8 (GLOVE) ×2
GLOVE SURG SS PI 7.0 STRL IVOR (GLOVE) ×2 IMPLANT
GOWN STRL REUS W/ TWL LRG LVL3 (GOWN DISPOSABLE) ×3 IMPLANT
GOWN STRL REUS W/ TWL XL LVL3 (GOWN DISPOSABLE) ×1 IMPLANT
GOWN STRL REUS W/TWL LRG LVL3 (GOWN DISPOSABLE) ×3
GOWN STRL REUS W/TWL XL LVL3 (GOWN DISPOSABLE) ×1
HEAD HUMERAL BIPOLAR 46X24X50 (Stem) ×1 IMPLANT
HEAD HUMERAL COMP STD (Orthopedic Implant) ×1 IMPLANT
HUMERAL HEAD BIPOLAR 46X24X50 (Stem) ×2 IMPLANT
HUMERAL HEAD COMP STD (Orthopedic Implant) ×2 IMPLANT
KIT BASIN OR (CUSTOM PROCEDURE TRAY) ×2 IMPLANT
KIT TURNOVER KIT B (KITS) ×2 IMPLANT
MANIFOLD NEPTUNE II (INSTRUMENTS) ×2 IMPLANT
NDL SUT .5 MAYO 1.404X.05X (NEEDLE) ×1 IMPLANT
NEEDLE HYPO 25GX1X1/2 BEV (NEEDLE) ×2 IMPLANT
NEEDLE MAYO TAPER (NEEDLE) ×1
NS IRRIG 1000ML POUR BTL (IV SOLUTION) ×2 IMPLANT
PACK SHOULDER (CUSTOM PROCEDURE TRAY) ×2 IMPLANT
PAD ARMBOARD 7.5X6 YLW CONV (MISCELLANEOUS) ×4 IMPLANT
PIN HUMERAL STMN 3.2MMX9IN (INSTRUMENTS) ×2 IMPLANT
RESTRAINT HEAD UNIVERSAL NS (MISCELLANEOUS) ×2 IMPLANT
SLING ARM IMMOBILIZER LRG (SOFTGOODS) ×2 IMPLANT
SLING ARM IMMOBILIZER MED (SOFTGOODS) IMPLANT
SMARTMIX MINI TOWER (MISCELLANEOUS) ×4
SPONGE LAP 18X18 X RAY DECT (DISPOSABLE) ×2 IMPLANT
STEM HUMERAL STRL 12MMX14MM (Stem) ×2 IMPLANT
STRIP CLOSURE SKIN 1/2X4 (GAUZE/BANDAGES/DRESSINGS) ×2 IMPLANT
SUCTION FRAZIER HANDLE 10FR (MISCELLANEOUS) ×1
SUCTION TUBE FRAZIER 10FR DISP (MISCELLANEOUS) ×1 IMPLANT
SUPPORT WRAP ARM LG (MISCELLANEOUS) IMPLANT
SUT FIBERWIRE #2 38 T-5 BLUE (SUTURE)
SUT MAXBRAID (SUTURE) ×10 IMPLANT
SUT MNCRL AB 4-0 PS2 18 (SUTURE) ×2 IMPLANT
SUT MON AB 2-0 CT1 36 (SUTURE) ×2 IMPLANT
SUT VIC AB 0 CT1 27 (SUTURE) ×1
SUT VIC AB 0 CT1 27XBRD ANBCTR (SUTURE) ×1 IMPLANT
SUT VIC AB 2-0 CT1 27 (SUTURE) ×1
SUT VIC AB 2-0 CT1 TAPERPNT 27 (SUTURE) ×1 IMPLANT
SUTURE FIBERWR #2 38 T-5 BLUE (SUTURE) IMPLANT
SYR BULB IRRIGATION 50ML (SYRINGE) ×2 IMPLANT
TOWEL OR 17X24 6PK STRL BLUE (TOWEL DISPOSABLE) ×2 IMPLANT
TOWEL OR 17X26 10 PK STRL BLUE (TOWEL DISPOSABLE) ×2 IMPLANT
TOWER CARTRIDGE SMART MIX (DISPOSABLE) IMPLANT
TOWER SMARTMIX MINI (MISCELLANEOUS) ×2 IMPLANT
TRAY FOLEY W/BAG SLVR 14FR (SET/KITS/TRAYS/PACK) IMPLANT
WATER STERILE IRR 1000ML POUR (IV SOLUTION) ×2 IMPLANT

## 2018-03-18 NOTE — Discharge Instructions (Signed)
Keep sling on at all times.  Do not bear weight with arm. Keep your dressing on and dry until follow up. Take pain medicine as needed with the goal of transitioning to over the counter medicines.  If needed, you may increase pain medication for the first few days post op to 2 tablets every 4 hours.  INSTRUCTIONS AFTER JOINT REPLACEMENT   o Remove items at home which could result in a fall. This includes throw rugs or furniture in walking pathways o ICE to the affected joint every three hours while awake for 30 minutes at a time, for at least the first 3-5 days, and then as needed for pain and swelling.  Continue to use ice for pain and swelling. You may notice swelling that will progress down to the foot and ankle.  This is normal after surgery.  Elevate your leg when you are not up walking on it.   o Continue to use the breathing machine you got in the hospital (incentive spirometer) which will help keep your temperature down.  It is common for your temperature to cycle up and down following surgery, especially at night when you are not up moving around and exerting yourself.  The breathing machine keeps your lungs expanded and your temperature down.   DIET:  As you were doing prior to hospitalization, we recommend a well-balanced diet.  DRESSING / WOUND CARE / SHOWERING  Keep dressing dry.  You may use an occlusive plastic wrap (Press'n Seal for example) with blue painter's tape at edges, NO SOAKING/SUBMERGING IN THE BATHTUB.  If the bandage gets wet, change with a clean dry gauze.  If the incision gets wet, pat the wound dry with a clean towel.  ACTIVITY  o Increase activity slowly as tolerated, but follow the weight bearing instructions below.   o No driving for 6 weeks or until further direction given by your physician.  You cannot drive while taking narcotics.  o No lifting or carrying greater than 10 lbs. until further directed by your surgeon. o Avoid periods of inactivity such as  sitting longer than an hour when not asleep. This helps prevent blood clots.  o You may return to work once you are authorized by your doctor.     WEIGHT BEARING  Do not bear weight with arm.  Maintain sling at all times.  A rehabilitation program following joint replacement surgery can speed recovery and prevent re-injury in the future due to weakened muscles. Contact your doctor or a physical therapist for more information on knee rehabilitation.    CONSTIPATION  Constipation is defined medically as fewer than three stools per week and severe constipation as less than one stool per week.  Even if you have a regular bowel pattern at home, your normal regimen is likely to be disrupted due to multiple reasons following surgery.  Combination of anesthesia, postoperative narcotics, change in appetite and fluid intake all can affect your bowels.   YOU MUST use at least one of the following options; they are listed in order of increasing strength to get the job done.  They are all available over the counter, and you may need to use some, POSSIBLY even all of these options:    Drink plenty of fluids (prune juice may be helpful) and high fiber foods Colace 100 mg by mouth twice a day  Senokot for constipation as directed and as needed Dulcolax (bisacodyl), take with full glass of water  Miralax (polyethylene glycol) once or twice  a day as needed.  If you have tried all these things and are unable to have a bowel movement in the first 3-4 days after surgery call either your surgeon or your primary doctor.    If you experience loose stools or diarrhea, hold the medications until you stool forms back up.  If your symptoms do not get better within 1 week or if they get worse, check with your doctor.  If you experience "the worst abdominal pain ever" or develop nausea or vomiting, please contact the office immediately for further recommendations for treatment.   ITCHING:  If you experience itching  with your medications, try taking only a single pain pill, or even half a pain pill at a time.  You can also use Benadryl over the counter for itching or also to help with sleep.   TED HOSE STOCKINGS:  Use stockings on both legs until for at least 2 weeks or as directed by physician office. They may be removed at night for sleeping.  MEDICATIONS:  See your medication summary on the After Visit Summary that nursing will review with you.  You may have some home medications which will be placed on hold until you complete the course of blood thinner medication.  It is important for you to complete the blood thinner medication as prescribed.  PRECAUTIONS:  If you experience chest pain or shortness of breath - call 911 immediately for transfer to the hospital emergency department.   If you develop a fever greater that 101 F, purulent drainage from wound, increased redness or drainage from wound, foul odor from the wound/dressing, or calf pain - CONTACT YOUR SURGEON.                                                   FOLLOW-UP APPOINTMENTS:  If you do not already have a post-op appointment, please call the office for an appointment to be seen by your surgeon.  Guidelines for how soon to be seen are listed in your After Visit Summary, but are typically between 1-4 weeks after surgery.  OTHER INSTRUCTIONS:     MAKE SURE YOU:   Understand these instructions.   Get help right away if you are not doing well or get worse.    Thank you for letting us be a part of your medical care team.  It is a privilege we respect greatly.  We hope these instructions will help you stay on track for a fast and full recovery!

## 2018-03-18 NOTE — Anesthesia Procedure Notes (Signed)
Anesthesia Regional Block: Interscalene brachial plexus block   Pre-Anesthetic Checklist: ,, timeout performed, Correct Patient, Correct Site, Correct Laterality, Correct Procedure, Correct Position, site marked, Risks and benefits discussed,  Surgical consent,  Pre-op evaluation,  At surgeon's request and post-op pain management  Laterality: Right  Prep: chloraprep       Needles:  Injection technique: Single-shot  Needle Type: Stimiplex     Needle Length: 9cm  Needle Gauge: 21     Additional Needles:   Procedures:,,,, ultrasound used (permanent image in chart),,,,  Narrative:  Start time: 03/18/2018 7:22 AM End time: 03/18/2018 7:27 AM Injection made incrementally with aspirations every 5 mL.  Performed by: Personally  Anesthesiologist: Lynda Rainwater, MD

## 2018-03-18 NOTE — Interval H&P Note (Signed)
History and Physical Interval Note:  03/18/2018 7:18 AM  Kevin Mathews  has presented today for surgery, with the diagnosis of OSTEOARTHRITIS RIGHT SHOULDER  The various methods of treatment have been discussed with the patient and family. After consideration of risks, benefits and other options for treatment, the patient has consented to  Procedure(s): RIGHT TOTAL SHOULDER ARTHROPLASTY (Right) as a surgical intervention .  The patient's history has been reviewed, patient examined, no change in status, stable for surgery.  I have reviewed the patient's chart and labs.  Questions were answered to the patient's satisfaction.     Delana Manganello D

## 2018-03-18 NOTE — Transfer of Care (Signed)
Immediate Anesthesia Transfer of Care Note  Patient: Kevin Mathews  Procedure(s) Performed: RIGHT TOTAL SHOULDER ARTHROPLASTY, BICEPS TENODESIS (Right Shoulder)  Patient Location: PACU  Anesthesia Type:GA combined with regional for post-op pain  Level of Consciousness: drowsy and patient cooperative  Airway & Oxygen Therapy: Patient Spontanous Breathing and Patient connected to face mask oxygen  Post-op Assessment: Report given to RN and Post -op Vital signs reviewed and stable  Post vital signs: Reviewed and stable  Last Vitals:  Vitals Value Taken Time  BP 124/65 03/18/2018 10:07 AM  Temp    Pulse 51 03/18/2018 10:07 AM  Resp 15 03/18/2018 10:07 AM  SpO2 99 % 03/18/2018 10:07 AM  Vitals shown include unvalidated device data.  Last Pain:  Vitals:   03/18/18 0548  TempSrc: Oral      Patients Stated Pain Goal: 3 (16/38/46 6599)  Complications: No apparent anesthesia complications

## 2018-03-18 NOTE — OR Nursing (Addendum)
35ML OF EXPAREL/NACL MIXTURE INJECTED AT 0925 (MIXTURE = 20ML EXPAREL/30ML NACL)

## 2018-03-18 NOTE — Anesthesia Preprocedure Evaluation (Signed)
Anesthesia Evaluation  Patient identified by MRN, date of birth, ID band Patient awake    Reviewed: Allergy & Precautions, NPO status , Patient's Chart, lab work & pertinent test results  Airway Mallampati: III  TM Distance: >3 FB Neck ROM: Full    Dental no notable dental hx.    Pulmonary neg pulmonary ROS, former smoker,    Pulmonary exam normal breath sounds clear to auscultation       Cardiovascular Exercise Tolerance: Good hypertension, Pt. on medications Normal cardiovascular exam Rhythm:Regular Rate:Normal     Neuro/Psych negative neurological ROS  negative psych ROS   GI/Hepatic negative GI ROS, Neg liver ROS, GERD  ,  Endo/Other  negative endocrine ROS  Renal/GU negative Renal ROS  negative genitourinary   Musculoskeletal negative musculoskeletal ROS (+) Arthritis ,   Abdominal   Peds negative pediatric ROS (+)  Hematology negative hematology ROS (+)   Anesthesia Other Findings Colon Cancer Hx  Reproductive/Obstetrics negative OB ROS                             Anesthesia Physical  Anesthesia Plan  ASA: III  Anesthesia Plan: General   Post-op Pain Management: GA combined w/ Regional for post-op pain   Induction: Intravenous  PONV Risk Score and Plan: 2 and Ondansetron and Midazolam  Airway Management Planned: Oral ETT  Additional Equipment:   Intra-op Plan:   Post-operative Plan: Extubation in OR  Informed Consent: I have reviewed the patients History and Physical, chart, labs and discussed the procedure including the risks, benefits and alternatives for the proposed anesthesia with the patient or authorized representative who has indicated his/her understanding and acceptance.   Dental advisory given  Plan Discussed with: CRNA and Surgeon  Anesthesia Plan Comments:         Anesthesia Quick Evaluation

## 2018-03-18 NOTE — Anesthesia Procedure Notes (Signed)
Procedure Name: Intubation Date/Time: 03/18/2018 7:36 AM Performed by: Genelle Bal, CRNA Pre-anesthesia Checklist: Patient identified, Emergency Drugs available, Suction available and Patient being monitored Patient Re-evaluated:Patient Re-evaluated prior to induction Oxygen Delivery Method: Circle system utilized Preoxygenation: Pre-oxygenation with 100% oxygen Induction Type: IV induction Ventilation: Mask ventilation without difficulty Laryngoscope Size: Mac and 3 Grade View: Grade I Tube type: Oral Tube size: 7.5 mm Number of attempts: 1 Airway Equipment and Method: Stylet Placement Confirmation: ETT inserted through vocal cords under direct vision,  positive ETCO2 and breath sounds checked- equal and bilateral Secured at: 23 cm Tube secured with: Tape Dental Injury: Teeth and Oropharynx as per pre-operative assessment

## 2018-03-18 NOTE — Op Note (Signed)
03/18/2018  1:31 PM  PATIENT:  Kevin Mathews    PRE-OPERATIVE DIAGNOSIS:  OSTEOARTHRITIS RIGHT SHOULDER  POST-OPERATIVE DIAGNOSIS:  Same  PROCEDURE:  RIGHT TOTAL SHOULDER ARTHROPLASTY, BICEPS TENODESIS  SURGEON:  Robbi Spells D, MD  PHYSICIAN ASSISTANT: Roxan Hockey, PA-C, he was present and scrubbed throughout the case, critical for completion in a timely fashion, and for retraction, instrumentation, and closure.   ANESTHESIA:   General  PREOPERATIVE INDICATIONS:  Kevin Mathews is a  77 y.o. male with a diagnosis of OSTEOARTHRITIS RIGHT SHOULDER who failed conservative measures and elected for surgical management.    The risks benefits and alternatives were discussed with the patient preoperatively including but not limited to the risks of infection, bleeding, nerve injury, cardiopulmonary complications, the need for revision surgery, dislocation, loosening, incomplete relief of pain, among others, and the patient was willing to proceed.   OPERATIVE IMPLANTS: Biomet size 12 press-fit humeral stem, size 46  humeral head, set in the neutral position with increased coverage posteriorly, with a cemented glenoid polyethylene 3 peg implant.   OPERATIVE FINDINGS: Advanced glenohumeral osteoarthritis involving the glenoid and the humeral head with substantial osteophyte formation inferiorly.   OPERATIVE PROCEDURE: The patient was brought to the operating room and placed in the supine position. General anesthesia was administered. IV antibiotics were given.  The upper extremity was prepped and draped in usual sterile fashion. The patient was in a beachchair position with all bony prominences padded.   Time out was performed and a deltopectoral approach was carried out. The biceps tendon was tenodesed to the pectoralis tendon. The subscapularis was released, tagging it with a #2 MaxBraid, leaving a cuff of tendon for repair.   The inferior osteophyte was removed, and release of the  capsule off of the humeral side was completed. The head was dislocated, and I reamed sequentially. I placed the humeral cutting guide at 30 of retroversion, and then pinned this into place, and made my humeral neck cut. This was at the appropriate level.   I then placed deep retractors and exposed the glenoid. I excised the labrum circumferentially, taking care to protect the axillary nerve inferiorly.   I then placed a guidewire into the center position, controlling appropriate version and inclination. I then reamed over the guidewire with the small reamer, and was satisfied with the preparation. I preserved the subchondral bone in order to maximize the strength and minimize the risk for subsequent subsidence.   I then drilled the central hole for the regenerex peg, and then placed the guide, and then drilled the 3 peripheral peg holes. I had excellent bony circumferential contact.   I then cleaned the glenoid, irrigated it copiously, and then dried it and cemented the prosthesis into place. Excellent seating was achieved. I had full exposure. The cement cured, and then I turned my attention to the humeral side.   I sequentially broached, up to the selected size, with the broach set at 30 of retroversion. I then placed the real stem. I trialed with multiple heads, and the above-named component was selected. Increased posterior coverage improved the coverage. The soft tissue tension was appropriate.   I then impacted the real humeral head into place, reduced the head, and irrigated copiously. Excellent stability and range of motion was achieved. I repaired the subscapularis with 4 #2 MaxBraid, as well as the rotator interval, and irrigated copiously once more. The subcutaneous tissue was closed with Vicryl including the deltopectoral fascia.   The skin was closed  with Steri-Strips and sterile gauze was applied. He had a preoperative nerve block. He tolerated the procedure well and there were no  complications.

## 2018-03-19 ENCOUNTER — Encounter (HOSPITAL_COMMUNITY): Payer: Self-pay | Admitting: Orthopedic Surgery

## 2018-03-19 DIAGNOSIS — M19011 Primary osteoarthritis, right shoulder: Secondary | ICD-10-CM | POA: Diagnosis not present

## 2018-03-19 NOTE — Progress Notes (Signed)
    Subjective: Patient reports pain as mild, controlled.  Tolerating diet.  Urinating.   No CP, SOB.  Ready to go home.  Objective:   VITALS:   Vitals:   03/18/18 1722 03/18/18 2033 03/18/18 2329 03/19/18 0350  BP: (!) 145/76 130/69 100/67 123/68  Pulse: 75 (!) 50 (!) 51 (!) 52  Resp: 16 18 18 16   Temp: 98.1 F (36.7 C) 98.2 F (36.8 C) 97.8 F (36.6 C) (!) 97.5 F (36.4 C)  TempSrc: Oral Oral Oral Oral  SpO2: 97% 95% 94% 96%  Weight:      Height:       CBC Latest Ref Rng & Units 03/07/2018 01/30/2018 06/13/2017  WBC 4.0 - 10.5 K/uL 7.6 5.6 13.6(H)  Hemoglobin 13.0 - 17.0 g/dL 14.8 14.8 9.9(L)  Hematocrit 39.0 - 52.0 % 44.8 44.1 30.1(L)  Platelets 150 - 400 K/uL 233 197 208   BMP Latest Ref Rng & Units 03/07/2018 01/30/2018 06/13/2017  Glucose 65 - 99 mg/dL 106(H) 102 111(H)  BUN 6 - 20 mg/dL 21(H) 19 14  Creatinine 0.61 - 1.24 mg/dL 1.03 0.99 0.99  Sodium 135 - 145 mmol/L 139 141 137  Potassium 3.5 - 5.1 mmol/L 4.2 3.8 3.9  Chloride 101 - 111 mmol/L 105 105 105  CO2 22 - 32 mmol/L 23 26 23   Calcium 8.9 - 10.3 mg/dL 9.5 9.7 8.8(L)   Intake/Output      04/16 0701 - 04/17 0700 04/17 0701 - 04/18 0700   P.O. 530    I.V. (mL/kg) 900 (10.9)    Total Intake(mL/kg) 1430 (17.3)    Urine (mL/kg/hr) 100 (0.1)    Blood 100    Total Output 200    Net +1230         Urine Occurrence 2 x       Physical Exam: General: NAD.  Upright in bed.  Calm, conversant.  Wife at bedside MSK RLE: Sling and ice pack in place.   Neurovascularly intact Sensation intact distally Intact pulses distally Incision: dressing C/D/I   Assessment: 1 Day Post-Op  S/P Procedure(s) (LRB): RIGHT TOTAL SHOULDER ARTHROPLASTY, BICEPS TENODESIS (Right) by Dr. Ernesta Amble. Murphy on 03/18/2018  Principal Problem:   Primary osteoarthritis, right shoulder Active Problems:   Hypertension   GERD (gastroesophageal reflux disease)   Primary osteoarthritis of shoulder   Primary osteoarthritis, status post  shoulder arthroplasty Doing well postop day 1. Eating, drinking, and voiding.  Mobilizing. Ready for discharge to home.  Plan: Incentive Spirometry Sling, Apply ice   Weight Bearing: Non Weight Bearing (NWB) RUE Dressings: Maintain Dressing.   VTE prophylaxis: Aspirin, SCDs, ambulation Dispo: Home today.   Charna Elizabeth Martensen III, PA-C 03/19/2018, 7:55 AM

## 2018-03-19 NOTE — Discharge Summary (Signed)
Discharge Summary  Patient ID: Kevin Mathews MRN: [id number] DOB/AGE: June 14, 1941 77 y.o.  Admit date: 03/18/2018 Discharge date: 03/19/2018  Admission Diagnoses:  Primary osteoarthritis, right shoulder  Discharge Diagnoses:  Principal Problem:   Primary osteoarthritis, right shoulder Active Problems:   Hypertension   GERD (gastroesophageal reflux disease)   Primary osteoarthritis of shoulder   Past Medical History:  Diagnosis Date  . Arthritis   . BPH (benign prostatic hyperplasia)   . Dyspnea    with excertion  . Frequent urination   . GERD (gastroesophageal reflux disease)    occasional  . History of kidney stones    passed  . Hypertension   . Malignant neoplasm of ascending colon s/p ileocolectomy 06/12/2017 06/12/2017    Surgeries: Procedure(s): RIGHT TOTAL SHOULDER ARTHROPLASTY, BICEPS TENODESIS on 03/18/2018   Consultants (if any):   Discharged Condition: Improved  Hospital Course: Kevin Mathews is an 77 y.o. male who was admitted 03/18/2018 with a diagnosis of Primary osteoarthritis, right shoulder and went to the operating room on 03/18/2018 and underwent the above named procedures.    He was given perioperative antibiotics:  Anti-infectives (From admission, onward)   Start     Dose/Rate Route Frequency Ordered Stop   03/18/18 1300  ceFAZolin (ANCEF) IVPB 1 g/50 mL premix     1 g 100 mL/hr over 30 Minutes Intravenous Every 6 hours 03/18/18 1119 03/19/18 0239   03/18/18 0539  ceFAZolin (ANCEF) IVPB 2g/100 mL premix     2 g 200 mL/hr over 30 Minutes Intravenous On call to O.R. 03/18/18 6789 03/18/18 0810    .  He was given sequential compression devices, early ambulation, and ASA for DVT prophylaxis.  He benefited maximally from the hospital stay and there were no complications.    Recent vital signs:  Vitals:   03/18/18 2329 03/19/18 0350  BP: 100/67 123/68  Pulse: (!) 51 (!) 52  Resp: 18 16  Temp: 97.8 F (36.6 C) (!) 97.5 F (36.4 C)  SpO2: 94%  96%    Recent laboratory studies:  Lab Results  Component Value Date   HGB 14.8 03/07/2018   HGB 14.8 01/30/2018   HGB 9.9 (L) 06/13/2017   Lab Results  Component Value Date   WBC 7.6 03/07/2018   PLT 233 03/07/2018   Lab Results  Component Value Date   INR 0.96 06/24/2013   Lab Results  Component Value Date   NA 139 03/07/2018   K 4.2 03/07/2018   CL 105 03/07/2018   CO2 23 03/07/2018   BUN 21 (H) 03/07/2018   CREATININE 1.03 03/07/2018   GLUCOSE 106 (H) 03/07/2018    Discharge Medications:   Allergies as of 03/19/2018   No Known Allergies     Medication List    STOP taking these medications   naproxen sodium 220 MG tablet Commonly known as:  ALEVE     TAKE these medications   acetaminophen 500 MG tablet Commonly known as:  TYLENOL Take 2 tablets (1,000 mg total) by mouth every 8 (eight) hours for 14 days. For Pain.   ADULT GUMMY PO Take 4 tablets by mouth daily.   aspirin EC 81 MG tablet Take 81 mg by mouth daily.   atorvastatin 10 MG tablet Commonly known as:  LIPITOR Take 10 mg by mouth every evening.   dexlansoprazole 60 MG capsule Commonly known as:  DEXILANT Take 60 mg by mouth daily as needed (acid reflux).   diphenhydrAMINE 25 MG tablet Commonly known as:  BENADRYL Take 25 mg by mouth daily as needed for allergies.   docusate sodium 100 MG capsule Commonly known as:  COLACE Take 1 capsule (100 mg total) by mouth 2 (two) times daily. To prevent constipation while taking pain medication.   doxazosin 4 MG tablet Commonly known as:  CARDURA Take 4 mg by mouth every evening.   finasteride 5 MG tablet Commonly known as:  PROSCAR Take 5 mg by mouth every evening.   hydrochlorothiazide 12.5 MG capsule Commonly known as:  MICROZIDE Take 12.5 mg by mouth every evening.   ibuprofen 200 MG tablet Commonly known as:  ADVIL,MOTRIN Take 400 mg by mouth 2 (two) times daily as needed for headache or mild pain.   Krill Oil 500 MG Caps Take  500 mg by mouth every evening.   MAGNESIUM PO Take 1-2 tablets by mouth every evening.   methocarbamol 500 MG tablet Commonly known as:  ROBAXIN Take 1 tablet (500 mg total) by mouth every 6 (six) hours as needed for muscle spasms.   ondansetron 4 MG tablet Commonly known as:  ZOFRAN Take 1 tablet (4 mg total) by mouth every 8 (eight) hours as needed for nausea or vomiting.   oxyCODONE 5 MG immediate release tablet Commonly known as:  ROXICODONE Take 1 tablet (5 mg total) by mouth every 4 (four) hours as needed for breakthrough pain.       Diagnostic Studies: Dg Shoulder Right Port  Result Date: 03/18/2018 CLINICAL DATA:  Followup right shoulder arthroplasty. EXAM: PORTABLE RIGHT SHOULDER COMPARISON:  01/02/2018 FINDINGS: Single image shows right shoulder arthroplasty. Visible components appear grossly well positioned. No sign of dislocation or subluxation on this single view. IMPRESSION: Good one view appearance following right shoulder arthroplasty. Electronically Signed   By: Nelson Chimes M.D.   On: 03/18/2018 10:28    Disposition: Discharge disposition: 01-Home or Self Care       Discharge Instructions    Discharge patient   Complete by:  As directed    Discharge disposition:  01-Home or Self Care   Discharge patient date:  03/19/2018      Follow-up Information    Renette Butters, MD Follow up.   Specialty:  Orthopedic Surgery Contact information: [address] [place] [phone]            Signed: Prudencio Burly III PA-C 03/19/2018, 7:58 AM

## 2018-03-19 NOTE — Care Management Obs Status (Signed)
[facility] NOTIFICATION   Patient Details  Name: Kevin Mathews MRN: [id number] Date of Birth: 1941-03-19   Medicare Observation Status Notification Given:       Ninfa Meeker, RN 03/19/2018, 8:20 AM

## 2018-03-19 NOTE — Evaluation (Addendum)
Occupational Therapy Evaluation and Discharge Patient Details Name: Kevin Mathews MRN: [id number] DOB: 1941-04-16 Today's Date: 03/19/2018    History of Present Illness Pt is a 77 y.o. male s/p R total shoulder arthroplasty and biceps tenodesis. Pt with PMH significant for arthritis, dyspnea, frequent urination, GERD, history of kidney stones, hypertension, and malignant neoplasm of ascending colon s/p ileocolectomy.   Clinical Impression   PTA, pt was independent with ADL and functional mobiltiy. He lives with his wife who is able to provide assistance in the mornings and overnight but will be working during the day. Pt currently reports minimal pain. Educated pt and wife concerning conservative shoulder protocol as well as HEP for elbow/wrist/hand AROM exercises. Pt does require cues to maintain no AROM/PROM R shoulder as well as NWB status but wife is consistently able to provide these cues. Pt additionally educated concerning compensatory UB ADL strategies and pt and his wife report understanding. No further acute OT needs identified and OT will sign off.     Follow Up Recommendations  Follow surgeon's recommendation for DC plan and follow-up therapies    Equipment Recommendations  None recommended by OT    Recommendations for Other Services       Precautions / Restrictions Precautions Precautions: Shoulder Type of Shoulder Precautions: Conservative: NWB R UE, sling at all times except dressing/bathing/exercises, elbow/wrist/hand AROM, no AROM/PROM R shoulder.  Shoulder Interventions: Shoulder sling/immobilizer Precaution Booklet Issued: Yes (comment) Precaution Comments: Educated pt and wife concerning shoulder protocol with handouts provided.  Required Braces or Orthoses: Sling Restrictions Weight Bearing Restrictions: No      Mobility Bed Mobility Overal bed mobility: Modified Independent                Transfers Overall transfer level: Modified independent                     Balance Overall balance assessment: Modified Independent                                         ADL either performed or assessed with clinical judgement   ADL Overall ADL's : Needs assistance/impaired Eating/Feeding: Set up;Sitting   Grooming: Set up;Sitting   Upper Body Bathing: Moderate assistance;Sitting   Lower Body Bathing: Moderate assistance;Sit to/from stand   Upper Body Dressing : Moderate assistance;Sitting   Lower Body Dressing: Moderate assistance;Sit to/from stand   Toilet Transfer: Modified Dentist and Hygiene: Modified independent   Tub/ Banker: Modified independent   Functional mobility during ADLs: Modified independent General ADL Comments: Pt and wife educated concerning ADL compensatory strategies, safe showering positioning, safe sleeping positioning, and safe sling management.      Vision Patient Visual Report: No change from baseline Vision Assessment?: No apparent visual deficits     Perception     Praxis      Pertinent Vitals/Pain Pain Assessment: Faces Faces Pain Scale: Hurts a little bit Pain Location: R shoulder Pain Descriptors / Indicators: Aching;Guarding;Sore Pain Intervention(s): Limited activity within patient's tolerance;Repositioned     Hand Dominance     Extremity/Trunk Assessment Upper Extremity Assessment Upper Extremity Assessment: RUE deficits/detail RUE Deficits / Details: Sensation in tact. Elbow/wrist/hand AROM in tact. Unable to assess R shoulder due to conservative protocol.    Lower Extremity Assessment Lower Extremity Assessment: Overall WFL for tasks assessed  Communication Communication Communication: No difficulties   Cognition Arousal/Alertness: Awake/alert Behavior During Therapy: WFL for tasks assessed/performed Overall Cognitive Status: Within Functional Limits for tasks assessed                                      General Comments       Exercises Exercises: Shoulder Shoulder Exercises Elbow Flexion: AROM;Right;10 reps;Standing Elbow Extension: AROM;Right;10 reps;Standing Wrist Flexion: AROM;Right;10 reps;Seated Wrist Extension: AROM;Right;10 reps;Seated Digit Composite Flexion: AROM;Right;10 reps;Seated Composite Extension: AROM;Right;10 reps;Seated   Shoulder Instructions Shoulder Instructions Donning/doffing shirt without moving shoulder: Moderate assistance;Caregiver independent with task Method for sponge bathing under operated UE: Moderate assistance;Caregiver independent with task Donning/doffing sling/immobilizer: Moderate assistance;Caregiver independent with task Correct positioning of sling/immobilizer: Minimal assistance;Caregiver independent with task ROM for elbow, wrist and digits of operated UE: Supervision/safety;Caregiver independent with task Sling wearing schedule (on at all times/off for ADL's): Supervision/safety;Caregiver independent with task Proper positioning of operated UE when showering: Supervision/safety;Caregiver independent with task Positioning of UE while sleeping: Supervision/safety;Caregiver independent with task    Home Living Family/patient expects to be discharged to:: Private residence Living Arrangements: Spouse/significant other Available Help at Discharge: Family Type of Home: House Home Access: Stairs to enter Technical brewer of Steps: 2 Entrance Stairs-Rails: Right Home Layout: Two level;Able to live on main level with bedroom/bathroom     Bathroom Shower/Tub: Occupational psychologist: Standard     Home Equipment: None          Prior Functioning/Environment Level of Independence: Independent                 OT Problem List: Decreased strength;Decreased range of motion;Impaired balance (sitting and/or standing);Decreased safety awareness;Decreased knowledge of use of DME or AE;Decreased  knowledge of precautions;Pain;Impaired UE functional use      OT Treatment/Interventions:      OT Goals(Current goals can be found in the care plan section) Acute Rehab OT Goals Patient Stated Goal: to go home OT Goal Formulation: With patient Potential to Achieve Goals: Good  OT Frequency:     Barriers to D/C:            Co-evaluation              AM-PAC PT "6 Clicks" Daily Activity     Outcome Measure Help from another person eating meals?: None Help from another person taking care of personal grooming?: A Little Help from another person toileting, which includes using toliet, bedpan, or urinal?: None Help from another person bathing (including washing, rinsing, drying)?: A Lot Help from another person to put on and taking off regular upper body clothing?: A Lot Help from another person to put on and taking off regular lower body clothing?: A Lot 6 Click Score: 17   End of Session Equipment Utilized During Treatment: (R shoulder sling) Nurse Communication: Mobility status  Activity Tolerance: Patient tolerated treatment well Patient left: in bed;with call bell/phone within reach;with family/visitor present  OT Visit Diagnosis: Other abnormalities of gait and mobility (R26.89);Pain Pain - Right/Left: Right Pain - part of body: Shoulder                Time: 3500-9381 OT Time Calculation (min): 33 min Charges:  OT General Charges $OT Visit: 1 Visit OT Evaluation $OT Eval Moderate Complexity: 1 Mod OT Treatments $Self Care/Home Management : 8-22 mins G-Codes:     Elliotte Marsalis A Tashia Leiterman, MS OTR/L  Pager: [phone] 03/19/2018, 10:43 AM

## 2018-03-19 NOTE — Anesthesia Postprocedure Evaluation (Signed)
Anesthesia Post Note  Patient: Kevin Mathews  Procedure(s) Performed: RIGHT TOTAL SHOULDER ARTHROPLASTY, BICEPS TENODESIS (Right Shoulder)     Patient location during evaluation: PACU Anesthesia Type: General Level of consciousness: awake and alert Pain management: pain level controlled Vital Signs Assessment: post-procedure vital signs reviewed and stable Respiratory status: spontaneous breathing, nonlabored ventilation and respiratory function stable Cardiovascular status: blood pressure returned to baseline and stable Postop Assessment: no apparent nausea or vomiting Anesthetic complications: no    Last Vitals:  Vitals:   03/19/18 0350 03/19/18 0822  BP: 123/68 105/61  Pulse: (!) 52 70  Resp: 16 16  Temp: (!) 36.4 C 36.7 C  SpO2: 96% 97%    Last Pain:  Vitals:   03/19/18 1100  TempSrc:   PainSc: 3    Pain Goal: Patients Stated Pain Goal: 3 (03/19/18 0631)               Lynda Rainwater

## 2018-03-19 NOTE — Progress Notes (Signed)
Patient alert and oriented, mae's well, voiding adequate amount of urine, swallowing without difficulty, no c/o pain at time of discharge. Patient discharged home with family. Script and discharged instructions given to patient. Patient and family stated understanding of instructions given. Patient has an appointment with Dr. Percell Miller

## 2018-03-20 ENCOUNTER — Encounter (HOSPITAL_COMMUNITY): Payer: Self-pay | Admitting: Orthopedic Surgery

## 2018-03-24 ENCOUNTER — Encounter (HOSPITAL_COMMUNITY): Payer: Self-pay | Admitting: Orthopedic Surgery

## 2018-03-31 DIAGNOSIS — M19011 Primary osteoarthritis, right shoulder: Secondary | ICD-10-CM | POA: Diagnosis not present

## 2018-04-02 DIAGNOSIS — M25611 Stiffness of right shoulder, not elsewhere classified: Secondary | ICD-10-CM | POA: Diagnosis not present

## 2018-04-02 DIAGNOSIS — Z4731 Aftercare following explantation of shoulder joint prosthesis: Secondary | ICD-10-CM | POA: Diagnosis not present

## 2018-04-08 DIAGNOSIS — R531 Weakness: Secondary | ICD-10-CM | POA: Diagnosis not present

## 2018-04-08 DIAGNOSIS — M25611 Stiffness of right shoulder, not elsewhere classified: Secondary | ICD-10-CM | POA: Diagnosis not present

## 2018-04-08 DIAGNOSIS — Z4731 Aftercare following explantation of shoulder joint prosthesis: Secondary | ICD-10-CM | POA: Diagnosis not present

## 2018-04-10 DIAGNOSIS — M25611 Stiffness of right shoulder, not elsewhere classified: Secondary | ICD-10-CM | POA: Diagnosis not present

## 2018-04-10 DIAGNOSIS — Z4731 Aftercare following explantation of shoulder joint prosthesis: Secondary | ICD-10-CM | POA: Diagnosis not present

## 2018-04-15 DIAGNOSIS — M25611 Stiffness of right shoulder, not elsewhere classified: Secondary | ICD-10-CM | POA: Diagnosis not present

## 2018-04-15 DIAGNOSIS — Z4731 Aftercare following explantation of shoulder joint prosthesis: Secondary | ICD-10-CM | POA: Diagnosis not present

## 2018-04-17 DIAGNOSIS — Z4731 Aftercare following explantation of shoulder joint prosthesis: Secondary | ICD-10-CM | POA: Diagnosis not present

## 2018-04-17 DIAGNOSIS — M25611 Stiffness of right shoulder, not elsewhere classified: Secondary | ICD-10-CM | POA: Diagnosis not present

## 2018-04-22 DIAGNOSIS — Z471 Aftercare following joint replacement surgery: Secondary | ICD-10-CM | POA: Diagnosis not present

## 2018-04-22 DIAGNOSIS — R531 Weakness: Secondary | ICD-10-CM | POA: Diagnosis not present

## 2018-04-22 DIAGNOSIS — M25611 Stiffness of right shoulder, not elsewhere classified: Secondary | ICD-10-CM | POA: Diagnosis not present

## 2018-04-24 DIAGNOSIS — Z4731 Aftercare following explantation of shoulder joint prosthesis: Secondary | ICD-10-CM | POA: Diagnosis not present

## 2018-04-24 DIAGNOSIS — R531 Weakness: Secondary | ICD-10-CM | POA: Diagnosis not present

## 2018-04-24 DIAGNOSIS — M25611 Stiffness of right shoulder, not elsewhere classified: Secondary | ICD-10-CM | POA: Diagnosis not present

## 2018-04-29 DIAGNOSIS — N401 Enlarged prostate with lower urinary tract symptoms: Secondary | ICD-10-CM | POA: Diagnosis not present

## 2018-04-29 DIAGNOSIS — R3914 Feeling of incomplete bladder emptying: Secondary | ICD-10-CM | POA: Diagnosis not present

## 2018-04-30 DIAGNOSIS — M25611 Stiffness of right shoulder, not elsewhere classified: Secondary | ICD-10-CM | POA: Diagnosis not present

## 2018-05-01 DIAGNOSIS — Z4731 Aftercare following explantation of shoulder joint prosthesis: Secondary | ICD-10-CM | POA: Diagnosis not present

## 2018-05-01 DIAGNOSIS — M25611 Stiffness of right shoulder, not elsewhere classified: Secondary | ICD-10-CM | POA: Diagnosis not present

## 2018-05-06 DIAGNOSIS — I2510 Atherosclerotic heart disease of native coronary artery without angina pectoris: Secondary | ICD-10-CM | POA: Diagnosis not present

## 2018-05-06 DIAGNOSIS — Z85038 Personal history of other malignant neoplasm of large intestine: Secondary | ICD-10-CM | POA: Diagnosis not present

## 2018-05-06 DIAGNOSIS — R531 Weakness: Secondary | ICD-10-CM | POA: Diagnosis not present

## 2018-05-06 DIAGNOSIS — D509 Iron deficiency anemia, unspecified: Secondary | ICD-10-CM | POA: Diagnosis not present

## 2018-05-06 DIAGNOSIS — N400 Enlarged prostate without lower urinary tract symptoms: Secondary | ICD-10-CM | POA: Diagnosis not present

## 2018-05-06 DIAGNOSIS — E785 Hyperlipidemia, unspecified: Secondary | ICD-10-CM | POA: Diagnosis not present

## 2018-05-06 DIAGNOSIS — Z4731 Aftercare following explantation of shoulder joint prosthesis: Secondary | ICD-10-CM | POA: Diagnosis not present

## 2018-05-06 DIAGNOSIS — I10 Essential (primary) hypertension: Secondary | ICD-10-CM | POA: Diagnosis not present

## 2018-05-06 DIAGNOSIS — M25611 Stiffness of right shoulder, not elsewhere classified: Secondary | ICD-10-CM | POA: Diagnosis not present

## 2018-05-06 DIAGNOSIS — C189 Malignant neoplasm of colon, unspecified: Secondary | ICD-10-CM | POA: Diagnosis not present

## 2018-05-08 DIAGNOSIS — Z4731 Aftercare following explantation of shoulder joint prosthesis: Secondary | ICD-10-CM | POA: Diagnosis not present

## 2018-05-08 DIAGNOSIS — M25611 Stiffness of right shoulder, not elsewhere classified: Secondary | ICD-10-CM | POA: Diagnosis not present

## 2018-05-20 DIAGNOSIS — M25611 Stiffness of right shoulder, not elsewhere classified: Secondary | ICD-10-CM | POA: Diagnosis not present

## 2018-05-20 DIAGNOSIS — Z4731 Aftercare following explantation of shoulder joint prosthesis: Secondary | ICD-10-CM | POA: Diagnosis not present

## 2018-05-22 DIAGNOSIS — M25611 Stiffness of right shoulder, not elsewhere classified: Secondary | ICD-10-CM | POA: Diagnosis not present

## 2018-05-22 DIAGNOSIS — Z4731 Aftercare following explantation of shoulder joint prosthesis: Secondary | ICD-10-CM | POA: Diagnosis not present

## 2018-05-27 DIAGNOSIS — M25611 Stiffness of right shoulder, not elsewhere classified: Secondary | ICD-10-CM | POA: Diagnosis not present

## 2018-05-27 DIAGNOSIS — Z4731 Aftercare following explantation of shoulder joint prosthesis: Secondary | ICD-10-CM | POA: Diagnosis not present

## 2018-05-28 DIAGNOSIS — M25611 Stiffness of right shoulder, not elsewhere classified: Secondary | ICD-10-CM | POA: Diagnosis not present

## 2018-05-29 DIAGNOSIS — M25611 Stiffness of right shoulder, not elsewhere classified: Secondary | ICD-10-CM | POA: Diagnosis not present

## 2018-05-29 DIAGNOSIS — Z4731 Aftercare following explantation of shoulder joint prosthesis: Secondary | ICD-10-CM | POA: Diagnosis not present

## 2018-06-03 DIAGNOSIS — Z4731 Aftercare following explantation of shoulder joint prosthesis: Secondary | ICD-10-CM | POA: Diagnosis not present

## 2018-06-03 DIAGNOSIS — M25611 Stiffness of right shoulder, not elsewhere classified: Secondary | ICD-10-CM | POA: Diagnosis not present

## 2018-06-03 DIAGNOSIS — R531 Weakness: Secondary | ICD-10-CM | POA: Diagnosis not present

## 2018-07-03 DIAGNOSIS — R251 Tremor, unspecified: Secondary | ICD-10-CM | POA: Diagnosis not present

## 2018-07-03 DIAGNOSIS — I10 Essential (primary) hypertension: Secondary | ICD-10-CM | POA: Diagnosis not present

## 2018-07-07 ENCOUNTER — Encounter: Payer: Self-pay | Admitting: Neurology

## 2018-07-16 DIAGNOSIS — D225 Melanocytic nevi of trunk: Secondary | ICD-10-CM | POA: Diagnosis not present

## 2018-07-16 DIAGNOSIS — L814 Other melanin hyperpigmentation: Secondary | ICD-10-CM | POA: Diagnosis not present

## 2018-07-16 DIAGNOSIS — L821 Other seborrheic keratosis: Secondary | ICD-10-CM | POA: Diagnosis not present

## 2018-07-16 DIAGNOSIS — D1801 Hemangioma of skin and subcutaneous tissue: Secondary | ICD-10-CM | POA: Diagnosis not present

## 2018-07-16 DIAGNOSIS — B353 Tinea pedis: Secondary | ICD-10-CM | POA: Diagnosis not present

## 2018-07-16 DIAGNOSIS — L578 Other skin changes due to chronic exposure to nonionizing radiation: Secondary | ICD-10-CM | POA: Diagnosis not present

## 2018-07-17 NOTE — Progress Notes (Signed)
Kevin Mathews was seen today in the movement disorders clinic for neurologic consultation at the request of Lavone Orn, MD.  The consultation is for the evaluation of L hand rest tremor.  This patient is accompanied in the office by his wife who supplements the history.   Specific Symptoms:  Tremor: Yes.  , started 2 years ago and gotten worse.  No tremor in L leg or on R side.  Not an issue with eating but he is R handed.  Notes it at rest.  Wife notes it at rest. Family hx of similar:  No. Voice: weaker/softer per pt/wife Sleep: sleeps well  Vivid Dreams:  No.  Acting out dreams:  No. Wet Pillows: No. Postural symptoms:  Yes.    Falls?  No. Bradykinesia symptoms: slow movements and difficulty getting out of a chair Loss of smell:  No. Loss of taste:  No. Urinary Incontinence:  No. Difficulty Swallowing:  No. Handwriting, micrographia: No. per pt; yes per wife Trouble with ADL's:  No.  Trouble buttoning clothing: No. Depression:  No..  Wife states that he is a little "irritable" sometimes Memory changes:  Yes.   , mild. Trouble remembering some things. Hallucinations:  No.  visual distortions: No. N/V:  No. Lightheaded:  Yes.  , if bends over while standing only  Syncope: No. Diplopia:  No. Dyskinesia:  No. Prior exposure to reglan/antipsychotics: No.  Neuroimaging of the brain has not previously been performed.    PREVIOUS MEDICATIONS: none to date  ALLERGIES:  No Known Allergies  CURRENT MEDICATIONS:  Outpatient Encounter Medications as of 07/22/2018  Medication Sig  . acetaminophen (TYLENOL) 325 MG tablet Take 650 mg by mouth every 6 (six) hours as needed.  Marland Kitchen aspirin EC 81 MG tablet Take 81 mg by mouth daily.  Marland Kitchen atorvastatin (LIPITOR) 10 MG tablet Take 10 mg by mouth every evening.   Marland Kitchen dexlansoprazole (DEXILANT) 60 MG capsule Take 60 mg by mouth daily as needed (acid reflux).  Marland Kitchen docusate sodium (COLACE) 100 MG capsule Take 1 capsule (100 mg total) by mouth 2  (two) times daily. To prevent constipation while taking pain medication.  Marland Kitchen doxazosin (CARDURA) 4 MG tablet Take 4 mg by mouth every evening.   . finasteride (PROSCAR) 5 MG tablet Take 5 mg by mouth every evening.   . hydrochlorothiazide (MICROZIDE) 12.5 MG capsule Take 12.5 mg by mouth every evening.   Marland Kitchen ibuprofen (ADVIL,MOTRIN) 200 MG tablet Take 400 mg by mouth 2 (two) times daily as needed for headache or mild pain.   . Multiple Vitamins-Minerals (ADULT GUMMY PO) Take 4 tablets by mouth daily.   . [DISCONTINUED] diphenhydrAMINE (BENADRYL) 25 MG tablet Take 25 mg by mouth daily as needed for allergies.  . [DISCONTINUED] Krill Oil 500 MG CAPS Take 500 mg by mouth every evening.   . [DISCONTINUED] MAGNESIUM PO Take 1-2 tablets by mouth every evening.  . [DISCONTINUED] methocarbamol (ROBAXIN) 500 MG tablet Take 1 tablet (500 mg total) by mouth every 6 (six) hours as needed for muscle spasms.  . [DISCONTINUED] ondansetron (ZOFRAN) 4 MG tablet Take 1 tablet (4 mg total) by mouth every 8 (eight) hours as needed for nausea or vomiting.   No facility-administered encounter medications on file as of 07/22/2018.     PAST MEDICAL HISTORY:   Past Medical History:  Diagnosis Date  . Arthritis   . BPH (benign prostatic hyperplasia)   . Dyspnea    with excertion  . Frequent urination   .  GERD (gastroesophageal reflux disease)    occasional  . History of kidney stones    passed  . Hypertension   . 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 COLONOSCOPY WITH PROPOFOL N/A 04/15/2017   Procedure: COLONOSCOPY WITH PROPOFOL;  Surgeon: Garlan Fair, MD;  Location: [facility];  Service: Endoscopy;  Laterality: N/A;  . LAPAROSCOPIC PARTIAL COLECTOMY N/A 06/12/2017   Procedure: LAPAROSCOPIC ASSISTED PARTIAL COLECTOMY;  Surgeon: Coralie Keens, MD;  Location: [facility];  Service:  General;  Laterality: N/A;  . NASAL SINUS SURGERY  1979  . TOTAL SHOULDER ARTHROPLASTY Left 07/01/2013   Procedure: TOTAL SHOULDER ARTHROPLASTY;  Surgeon: Ninetta Lights, MD;  Location: [facility];  Service: Orthopedics;  Laterality: Left;  with interscalene block  . TOTAL SHOULDER ARTHROPLASTY Right 03/18/2018   Procedure: RIGHT TOTAL SHOULDER ARTHROPLASTY, BICEPS TENODESIS;  Surgeon: Renette Butters, MD;  Location: [facility];  Service: Orthopedics;  Laterality: Right;    SOCIAL HISTORY:   Social History   Socioeconomic History  . Marital status: Married    Spouse name: Not on file  . Number of children: Not on file  . Years of education: Not on file  . Highest education level: Not on file  Occupational History  . Not on file  Social Needs  . Financial resource strain: Not on file  . Food insecurity:    Worry: Not on file    Inability: Not on file  . Transportation needs:    Medical: Not on file    Non-medical: Not on file  Tobacco Use  . Smoking status: Former Smoker    Years: 3.00    Last attempt to quit: 12/03/1968    Years since quitting: 49.6  . Smokeless tobacco: Never Used  . Tobacco comment: not often  Substance and Sexual Activity  . Alcohol use: No  . Drug use: No  . Sexual activity: Yes  Lifestyle  . Physical activity:    Days per week: Not on file    Minutes per session: Not on file  . Stress: Not on file  Relationships  . Social connections:    Talks on phone: Not on file    Gets together: Not on file    Attends religious service: Not on file    Active member of club or organization: Not on file    Attends meetings of clubs or organizations: Not on file    Relationship status: Not on file  . Intimate partner violence:    Fear of current or ex partner: Not on file    Emotionally abused: Not on file    Physically abused: Not on file    Forced sexual activity: Not on file  Other Topics Concern  . Not on file  Social History Narrative  . Not on file     FAMILY HISTORY:   Family Status  Relation Name Status  . Mother  Deceased  . Father  Deceased  . Brother  Alive  . Daughter  Alive  . Son  Alive    ROS:  Review of Systems  Constitutional: Negative.   HENT: Negative.   Eyes: Positive for blurred vision.  Respiratory: Positive for shortness of breath (mild DOE).   Cardiovascular: Negative.   Gastrointestinal: Negative.   Genitourinary: Positive for frequency (nocturia).  Skin: Negative.   Neurological: Negative.   Endo/Heme/Allergies: Negative.   Psychiatric/Behavioral: Negative.  PHYSICAL EXAMINATION:    VITALS:   Vitals:   07/22/18 0842  BP: 122/82  Pulse: 64  SpO2: 95%  Weight: 188 lb (85.3 kg)  Height: 5' 7"  (1.702 m)    GEN:  The patient appears stated age and is in NAD. HEENT:  Normocephalic, atraumatic.  The mucous membranes are moist. The superficial temporal arteries are without ropiness or tenderness. CV:  RRR Lungs:  CTAB Neck/HEME:  There are no carotid bruits bilaterally.  Neurological examination:  Orientation: The patient is alert and oriented x3. Fund of knowledge is appropriate.  Recent and remote memory are intact.  Attention and concentration are normal.    Able to name objects and repeat phrases. Cranial nerves: There is good facial symmetry. 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  Pupils are equal round and reactive to light bilaterally. Fundoscopic exam reveals clear margins bilaterally. Extraocular muscles are intact. The visual fields are full to confrontational testing. The speech is fluent and clear. Soft palate rises symmetrically and there is no tongue deviation. Hearing is intact to conversational tone. Sensation: Sensation is intact to light and pinprick throughout (facial, trunk, extremities). Vibration is intact at the bilateral big toe. There is no extinction with double simultaneous stimulation. There is no sensory dermatomal level identified. Motor: Strength is 5/5 in  the bilateral upper and lower extremities.   Shoulder shrug is equal and symmetric.  There is no pronator drift. Deep tendon reflexes: Deep tendon reflexes are 1/4 at the bilateral biceps, triceps, brachioradialis, and absent at the bilateral patella and achilles. Plantar responses are downgoing bilaterally.  Movement examination: Tone: There is mild increased tone in the LUE/LLE.  Normal on the right Abnormal movements: there is LUE resting tremor.  This increases with distraction.  It also increases with ambulation. Coordination:  There is very mild decremation with RAM's, seen mostly of finger taps on the left. Gait and Station: The patient has no difficulty arising out of a deep-seated chair without the use of the hands. The patient's stride length is normal.  The patient has a negative pull test.        Chemistry      Component Value Date/Time   NA 139 03/07/2018 1200   K 4.2 03/07/2018 1200   CL 105 03/07/2018 1200   CO2 23 03/07/2018 1200   BUN 21 (H) 03/07/2018 1200   CREATININE 1.03 03/07/2018 1200      Component Value Date/Time   CALCIUM 9.5 03/07/2018 1200   ALKPHOS 114 01/30/2018 0949   AST 24 01/30/2018 0949   ALT 31 01/30/2018 0949   BILITOT 0.7 01/30/2018 0949     Lab Results  Component Value Date   WBC 7.6 03/07/2018   HGB 14.8 03/07/2018   HCT 44.8 03/07/2018   MCV 92.8 03/07/2018   PLT 233 03/07/2018     ASSESSMENT/PLAN:  1.  Parkinsonism.  I suspect that this does represent idiopathic Parkinson's disease.  The patient has tremor, bradykinesia, rigidity  -We discussed the diagnosis as well as pathophysiology of the disease.  We discussed treatment options as well as prognostic indicators.  Patient education was provided.  -We discussed that it used to be thought that levodopa would increase risk of melanoma but now it is believed that Parkinsons itself likely increases risk of melanoma. he is to get regular skin checks.  He just had a skin check with Dr.  Allyson Sabal last week.    -Greater than 50% of the 60 minute visit was spent  in counseling answering questions and talking about what to expect now as well as in the future.  We talked about medication options as well as potential future surgical options.  We talked about safety in the home.  -After discussing medication, we decided to hold on that for now.  We will continue to reassess that in the future.  -We discussed community resources in the area including patient support groups and community exercise programs for PD and pt education was provided to the patient.  -Met with my Parkinson social worker today.  -will do TSH (hasn't had one done per PCP)  2.  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  Recommend he f/u with Dr. Claudean Kinds.    3.  Follow-up in 6 months, sooner should new neurologic issues arise.  Cc:  Lavone Orn, MD

## 2018-07-22 ENCOUNTER — Ambulatory Visit: Payer: Medicare HMO | Admitting: Neurology

## 2018-07-22 ENCOUNTER — Other Ambulatory Visit: Payer: Medicare HMO

## 2018-07-22 ENCOUNTER — Encounter: Payer: Self-pay | Admitting: Psychology

## 2018-07-22 ENCOUNTER — Encounter: Payer: Self-pay | Admitting: Neurology

## 2018-07-22 VITALS — BP 122/82 | HR 64 | Ht 67.0 in | Wt 188.0 lb

## 2018-07-22 DIAGNOSIS — R251 Tremor, unspecified: Secondary | ICD-10-CM

## 2018-07-22 DIAGNOSIS — G20 Parkinson's disease: Secondary | ICD-10-CM

## 2018-07-22 NOTE — Progress Notes (Signed)
Met with the patient and his wife today father in the clinic.  We reviewed the Parkinson's resources in the community and focused on exercise and support.  The patient has joined the [employer] close to him, but is interested in attending a couple of Parkinson's cycle classes and possibly rock steady boxing to see what those classes are like and be able to transfer some of the exercise independently at his gym location. I shared my contact information and I am available to meet with the patient and his wife again if needed or be a resource via telephone.

## 2018-07-22 NOTE — Patient Instructions (Signed)
1. Your provider has requested that you have labwork completed today. Please go to [facility] ([phone]) on the second floor of this building before leaving the office today. You do not need to check in. If you are not called within 15 minutes please check with the front desk.   

## 2018-07-23 LAB — TSH: TSH: 3.74 m[IU]/L (ref 0.40–4.50)

## 2018-07-31 ENCOUNTER — Telehealth: Payer: Self-pay | Admitting: Neurology

## 2018-07-31 NOTE — Telephone Encounter (Signed)
TSH was never resulted in epic so sent via fax.  TSH was done on July 22, 2018 and was 3.74.  Date, please let the patient know his thyroid testing looks good.

## 2018-07-31 NOTE — Telephone Encounter (Signed)
Spoke with patient's wife and made her aware labs okay.

## 2018-08-12 NOTE — Progress Notes (Signed)
[place]  [phone] Fax:([phone]) [phone]  Clinic Follow Up Note   Patient Care Team: Lavone Orn, MD as PCP - General (Internal Medicine) Coralie Keens, MD as Consulting Physician (General Surgery) Truitt Merle, MD as Consulting Physician (Hematology)   Date of Service:  08/14/2018    CHIEF COMPLAINTS:  Follow up stage I right colon cancer   Oncology History   Cancer Staging Malignant neoplasm of ascending colon s/p ileocolectomy 06/12/2017 Staging form: Colon and Rectum, AJCC 8th Edition - Pathologic stage from 06/12/2017: Stage I (pT2, pN0, cM0) - Signed by Alla Feeling, NP on 08/01/2017      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 Resected and retrieved. - Tumor in the proximal ascending colon. Biopsied. - The examination was otherwise normal.     04/15/2017 Pathology Results    Diagnosis 04/15/17 by Dr. Wynetta Emery  1. Colon, biopsy, at junction of cecum and ascending - INVASIVE ADENOCARCINOMA 2. Colon, polyp(s), descending - BENIGN COLORECTAL MUCOSA. - ASSOCIATED BENIGN LYMPHOID AGGREGATES. - MELANOSIS COLI PRESENT. - NO DYSPLASIA OR MALIGNANCY IDENTIFIED. Microscopic Comment 1. Dr. Lyndon Code has seen the first specimen (cecal and ascending colon biopsy) in consultation with agreement. The findings are called to [facility] who is taking the result for Dr. Wynetta Emery on 04/16/2017.    04/15/2017 Initial Diagnosis    Malignant neoplasm of ascending colon s/p ileocolectomy 06/12/2017    04/25/2017 Imaging    CT CAP W Contrast 04/25/17 IMPRESSION: Moderate size sliding-type hiatal hernia. Coronary artery calcifications are noted suggesting coronary artery disease. Scarring is noted in both lungs. 4 mm nodule is noted in left lung apex. No follow-up needed if patient is low-risk.  Non-contrast chest CT can be considered in 12 months if patient is high-risk. This recommendation follows the consensus statement: Guidelines for Management of Incidental Pulmonary Nodules Detected on CT Images: From the Fleischner Society 2017; Radiology 2017; 284:228-243. Small rounded low density seen peripherally in right hepatic lobe which most likely represent cyst, but metastatic disease cannot be excluded given the history of colonic malignancy. Continued follow-up is recommended. 16 mm soft tissue density seen in the cecum which may represent neoplasm or malignancy, as noted on colonoscopy. Aortic atherosclerosis. Stable prostatic enlargement.    06/12/2017 Surgery    LAPAROSCOPIC ASSISTED PARTIAL COLECTOMY by Dr. Ninfa Linden    The [place] port was placed to the opening and insufflation of the abdomen was begun. 64 [name] reports then placed the patient's upper midline under direct vision. I could easily identify the cecum and ascending colon. There was a tattoo where the colonoscopy biopsy was performed indicating the level of the cancer. I saw no other intra-abdominal pathology. The liver appeared normal. I next mobilized the right colon along the white line of [place] free of the appendix is well with harmonic scalpel. I then took down the hepatic flexure freed gallbladder off of the colon with the Harmonic scalpel as well. The entire right colon became easily mobile and was able to easily move past the midline. At this point I converted to the open portion of procedure. I removed all ports. I created an incision between the 2 five-minute reports with scalpel. I then took this down through the fascia with electrocautery. The peritoneum was then opened entirely to the incision. I was then able to easily eviscerate the cecum and right  colon and distal ileum. I transected the distal ileum with the GIA 75 stapler. I then transected the transverse colon proximally with the GIA 75 staplers well.  I then took down the mesentery with the LigaSure cautery device. I easily palpate the mass inside the colon. The specimen was sent to pathology for evaluation. I then reapproximated the small bowel to the proximal colon in a side-to-side fashion with interrupted silk sutures. I performed a colostomy and enterotomy with the cautery and performed a side-to-side anastomosis with a single firing the GIA 75 stapler. The opening was then closed with TA 60 stapler. I evaluated stable on hemostasis appeared to be achieved prior to closing with a TA 60. I then reinforced stapler with interrupted silk sutures and closed the mesenteric defect silk sutures as well. Again the anastomosis appeared pink and well-perfused.  I then placed colon back into the abdomen. I then irrigated the abdomen with several liters normal saline. Hemostasis again appeared to be achieved.    06/12/2017 Pathology Results    Diagnosis 06/12/17 Colon, segmental resection for tumor, right colon - ADENOCARCINOMA, MODERATELY DIFFERENTIATED - NO CARCINOMA IDENTIFIED IN NINETEEN LYMPH NODES (0/19) - MARGINS UNINVOLVED BY CARCINOMA - BENIGN APPENDIX WITH LUMINAL FIBROSIS - SEE ONCOLOGY TABLE BELOW    HISTORY OF PRESENTING ILLNESS: 08/01/17  Kevin Mathews 77 y.o. male is here today with his wife for recently diagnosed colon cancer found on routine screening colonoscopy. He was referred by Dr. Ninfa Linden. On previous colonoscopy 10 years ago, 1 polyp was removed but was otherwise normal. He has been in his usual state of health prior to this routine screening on 04/25/2017. He had been occasionally constipated and mildly fatigued leading up to his diagnosis; his weight was stable.   He had a CT chest/abdomen/pelvis on 04/25/2017 after the colonoscopy that showed a 16 mm soft tissue density in the cecum; and incidentally revealed a small rounded low density seen peripherally in the right hepatic lobe which most likely represents a cyst, a 39m nodule in  the left lung apex, a moderately-sized hiatal hernia, and coronary artery calcifications.   Surgical pathology from lap assisted partial ileocolectomy on 06/12/2017 from right colon resection confirmed moderately differentiated adenocarcinoma, 0/19 lymph nodes negative for disease, and negative margins.  He was anemic at the time of surgery with Hgb 9.9 on 06/13/17; he began taking Ferrous sulfate 1 week ago. Since surgery his energy has improved, he has an appetite but notes early satiety. He has regular and normal bowel movements now. He is using Advil for post-op pain, no requiring oxycodone. He has heartburn that is controlled with daily antacid. He denies abdominal pain, nausea, vomiting, diarrhea, blood in stool or rectal bleeding. He has occasionally productive cough but this is improving. He notes dizziness with abrupt position changes such as standing up too quickly.   In the past he has had arthritis, GERD, HTN. His wife says he had a bump on his lip last year that his dermatologist froze. He has a distant history of smoking 3-4 cigarettes per day for 4 years.   CURRENT THERAPY: Colon cancer surveillance    INTERVAL HISTORY  Kevin GRUENBERGis here for a follow up of his colon cancer. He is here  With his wife. He is doing well and has no new symptoms. His BMs are normal and he has no abdominal pain or bloating. He denies CP. He states that he experiences back pain that improves with walking. His shoulder repair went  well and he is recovering nicely. He has no new complaints.  His wife said that he sees a neurologist for parkinsonism.     MEDICAL HISTORY:  Past Medical History:  Diagnosis Date  . Arthritis   . BPH (benign prostatic hyperplasia)   . Dyspnea    with excertion  . Frequent urination   . GERD (gastroesophageal reflux disease)    occasional  . History of kidney stones    passed  . Hypertension   . Malignant neoplasm of ascending colon s/p ileocolectomy 06/12/2017  06/12/2017    SURGICAL HISTORY: Past Surgical History:  Procedure Laterality Date  . COLONOSCOPY WITH PROPOFOL N/A 04/15/2017   Procedure: COLONOSCOPY WITH PROPOFOL;  Surgeon: Garlan Fair, MD;  Location: [facility];  Service: Endoscopy;  Laterality: N/A;  . LAPAROSCOPIC PARTIAL COLECTOMY N/A 06/12/2017   Procedure: LAPAROSCOPIC ASSISTED PARTIAL COLECTOMY;  Surgeon: Coralie Keens, MD;  Location: [facility];  Service: General;  Laterality: N/A;  . NASAL SINUS SURGERY  1979  . TOTAL SHOULDER ARTHROPLASTY Left 07/01/2013   Procedure: TOTAL SHOULDER ARTHROPLASTY;  Surgeon: Ninetta Lights, MD;  Location: [place];  Service: Orthopedics;  Laterality: Left;  with interscalene block  . TOTAL SHOULDER ARTHROPLASTY Right 03/18/2018   Procedure: RIGHT TOTAL SHOULDER ARTHROPLASTY, BICEPS TENODESIS;  Surgeon: Renette Butters, MD;  Location: [place];  Service: Orthopedics;  Laterality: Right;    SOCIAL HISTORY: Social History   Socioeconomic History  . Marital status: Married    Spouse name: Not on file  . Number of children: Not on file  . Years of education: Not on file  . Highest education level: Not on file  Occupational History  . 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 Financial resource strain: Not on file  . Food insecurity:    Worry: Not on file    Inability: Not on file  . Transportation needs:    Medical: Not on file    Non-medical: Not on file  Tobacco Use  . Smoking status: Former Smoker    Years: 3.00    Last attempt to quit: 12/03/1968    Years since quitting: 49.7  . Smokeless tobacco: Never Used  . Tobacco comment: not often  Substance and Sexual Activity  . Alcohol use: No  . Drug use: No  . Sexual activity: Yes  Lifestyle  . Physical activity:    Days per week: Not on file    Minutes per session: Not on file  . Stress: Not on file  Relationships  . Social connections:    Talks on phone: Not on file    Gets together: Not on file     Attends religious service: Not on file    Active member of club or organization: Not on file    Attends meetings of clubs or organizations: Not on file    Relationship status: Not on file  . Intimate partner violence:    Fear of current or ex partner: Not on file    Emotionally abused: Not on file    Physically abused: Not on file    Forced sexual activity: Not on file  Other Topics Concern  . Not on file  Social History Narrative  . Not on file    FAMILY HISTORY: Family History  Problem Relation Age of Onset  . Hypertension Father   . Healthy Daughter   . Healthy Son    He denies family history of cancer.  ALLERGIES:  has No Known Allergies.  MEDICATIONS:  Current Outpatient Medications  Medication Sig Dispense Refill  . acetaminophen (TYLENOL) 325 MG tablet Take 650 mg by mouth every 6 (six) hours as needed.    Marland Kitchen aspirin EC 81 MG tablet Take 81 mg by mouth daily.    Marland Kitchen atorvastatin (LIPITOR) 10 MG tablet Take 10 mg by mouth every evening.     Marland Kitchen dexlansoprazole (DEXILANT) 60 MG capsule Take 60 mg by mouth daily as needed (acid reflux).    Marland Kitchen docusate sodium (COLACE) 100 MG capsule Take 1 capsule (100 mg total) by mouth 2 (two) times daily. To prevent constipation while taking pain medication. 60 capsule 0  . doxazosin (CARDURA) 4 MG tablet Take 4 mg by mouth every evening.     . finasteride (PROSCAR) 5 MG tablet Take 5 mg by mouth every evening.     . hydrochlorothiazide (MICROZIDE) 12.5 MG capsule Take 12.5 mg by mouth every evening.     Marland Kitchen ibuprofen (ADVIL,MOTRIN) 200 MG tablet Take 400 mg by mouth 2 (two) times daily as needed for headache or mild pain.     . Multiple Vitamins-Minerals (ADULT GUMMY PO) Take 4 tablets by mouth daily.      No current facility-administered medications for this visit.     REVIEW OF SYSTEMS:   Constitutional: Denies fevers, chills or abnormal night sweats  Eyes: Denies blurriness of vision, double vision or watery eyes Ears, nose, mouth,  throat, and face: Denies mucositis or sore throat Respiratory: Denies dyspnea or wheezes  Cardiovascular: Denies palpitation, chest discomfort or lower extremity swelling Gastrointestinal:  Denies nausea, vomiting, constipation, or diarrhea. Skin: Denies abnormal skin rashes Lymphatics: Denies new lymphadenopathy or easy bruising Neurological:Denies numbness, tingling or new weaknesses (+) parkinsonism, resting hand tremors  MSK:(+) back pain  Behavioral/Psych: Mood is stable, no new changes  All other systems were reviewed with the patient and are negative.  PHYSICAL EXAMINATION:  ECOG PERFORMANCE STATUS: 0  Vitals:   08/14/18 1301  BP: (!) 133/99  Pulse: (!) 56  Resp: 17  Temp: 98 F (36.7 C)  SpO2: 99%   Filed Weights   08/14/18 1301  Weight: 190 lb 1.6 oz (86.2 kg)     GENERAL:alert, no distress and comfortable SKIN: skin color, texture, turgor are normal, no rashes or significant lesions EYES: normal, conjunctiva are pink and non-injected, sclera clear OROPHARYNX:no exudate, no erythema and lips, buccal mucosa, and tongue normal  NECK: supple, thyroid normal size, non-tender, without nodularity LYMPH:  no palpable cervical or supraclavicular lymphadenopathy LUNGS: clear to auscultation bilaterally, normal breathing effort HEART: regular rate & rhythm, S1 and S2 present, no murmurs. no lower extremity edema ABDOMEN:abdomen soft, non-tender and normal bowel sounds (+) s/p ileocolectomy, upper midline abd incision pink, healing well, non-tender. Musculoskeletal:no cyanosis of digits and no clubbing  PSYCH: alert & oriented x 3 with fluent speech NEURO: no focal motor/sensory deficits  LABORATORY DATA:  I have reviewed the data as listed CBC Latest Ref Rng & Units 08/14/2018 03/07/2018 01/30/2018  WBC 4.0 - 10.3 K/uL 5.8 7.6 5.6  Hemoglobin 13.0 - 17.1 g/dL 13.6 14.8 14.8  Hematocrit 38.4 - 49.9 % 41.9 44.8 44.1  Platelets 140 - 400 K/uL 187 233 197    CMP Latest Ref  Rng & Units 08/14/2018 03/07/2018 01/30/2018  Glucose 70 - 99 mg/dL 96 106(H) 102  BUN 8 - 23 mg/dL 20 21(H) 19  Creatinine 0.61 - 1.24 mg/dL 0.96 1.03 0.99  Sodium 135 -  145 mmol/L 141 139 141  Potassium 3.5 - 5.1 mmol/L 4.4 4.2 3.8  Chloride 98 - 111 mmol/L 107 105 105  CO2 22 - 32 mmol/L 26 23 26   Calcium 8.9 - 10.3 mg/dL 9.6 9.5 9.7  Total Protein 6.5 - 8.1 g/dL 7.1 - 7.3  Total Bilirubin 0.3 - 1.2 mg/dL 0.6 - 0.7  Alkaline Phos 38 - 126 U/L 107 - 114  AST 15 - 41 U/L 19 - 24  ALT 0 - 44 U/L 20 - 31   Results for DEJUAN, ELMAN (MRN [id number]) as of 08/12/2018 15:39  Ref. Range 06/07/2017 10:49 01/30/2018 09:49  CEA Latest Ref Range: 0.0 - 4.7 ng/mL 1.7   CEA ([facility]) Latest Ref Range: 0.00 - 5.00 ng/mL  1.23    PATHOLOGY  Diagnosis 06/12/17 Colon, segmental resection for tumor, right colon - ADENOCARCINOMA, MODERATELY DIFFERENTIATED - NO CARCINOMA IDENTIFIED IN NINETEEN LYMPH NODES (0/19) - MARGINS UNINVOLVED BY CARCINOMA - BENIGN APPENDIX WITH LUMINAL FIBROSIS - SEE ONCOLOGY TABLE BELOW Microscopic Comment COLON AND RECTUM (INCLUDING TRANS-ANAL RESECTION): Specimen: Right colon and terminal ileum Procedure: Right hemicolectomy Tumor site: Cecum Specimen integrity: Intact Macroscopic tumor perforation: Not identified Invasive tumor: Maximum size: 2.8 cm Histologic type: Adenocarcinoma Histologic grade and differentiation: G2 (moderately differentiated/low grade) Microscopic extension of invasive tumor: Tumor invades muscularis propria Lymph-Vascular invasion: Not identified Peri-neural invasion: Not identified Tumor deposit(s): Not identified Resection margins: All margins are uninvolved by invasive carcinoma, high-grade dysplasia, intramucosal adenocarcinoma, and adenoma Distance closest margin (if all above margins negative): Proximal margin 8.7 cm Treatment effect (neo-adjuvant therapy): No know presurgical therapy Additional polyp(s): Not  identified Non-neoplastic findings: Not identified Lymph nodes: number examined 19; number positive: 0 Pathologic Staging: T2, N0 Ancillary studies: MMR (immunohistochemistry) and MSI (PCR) are pending and will be reported in an addendum.  Diagnosis 04/15/17 by Dr. Wynetta Emery  1. Colon, biopsy, at junction of cecum and ascending - INVASIVE ADENOCARCINOMA 2. Colon, polyp(s), descending - BENIGN COLORECTAL MUCOSA. - ASSOCIATED BENIGN LYMPHOID AGGREGATES. - MELANOSIS COLI PRESENT. - NO DYSPLASIA OR MALIGNANCY IDENTIFIED. Microscopic Comment 1. Dr. Lyndon Code has seen the first specimen (cecal and ascending colon biopsy) in consultation with agreement. The findings are called to [facility] who is taking the result for Dr. Wynetta Emery on 04/16/2017.  PROCEDURES  Colonoscopy 04/15/17 IMPRESSION - One 5 mm polyp in the proximal descending colon, removed with a cold snare. Resected and retrieved. - Tumor in the proximal ascending colon. Biopsied. - The examination was otherwise normal.  RADIOGRAPHIC STUDIES: I have personally reviewed the radiological images as listed and agreed with the findings in the report. No results found.   CT CAP W Contrast 04/25/17 IMPRESSION: Moderate size sliding-type hiatal hernia. Coronary artery calcifications are noted suggesting coronary artery disease. Scarring is noted in both lungs. 4 mm nodule is noted in left lung apex. No follow-up needed if patient is low-risk. Non-contrast chest CT can be considered in 12 months if patient is high-risk. This recommendation follows the consensus statement: Guidelines for Management of Incidental Pulmonary Nodules Detected on CT Images: From the Fleischner Society 2017; Radiology 2017; 284:228-243. Small rounded low density seen peripherally in right hepatic lobe which most likely represent cyst, but metastatic disease cannot be excluded given the history of colonic malignancy. Continued follow-up is recommended. 16 mm soft  tissue density seen in the cecum which may represent neoplasm or malignancy, as noted on colonoscopy. Aortic atherosclerosis. Stable prostatic enlargement.  ASSESSMENT & PLAN:  Kevin Mathews is a  77 y.o. caucasian male with a history of GERD, HTN, and osteoarthritis with newly diagnosed stage I colon cancer s/p partial ileocolectomy on 06/12/2017.   1. Malignant neoplasm of ascending colon, pT2N0M0, stage I, G2 -We discussed his pathology results for his surgery which showed a stage I disease with no positive lymph nodes. No other high risk features, such as lymphovascular invasion, high-grade, etc.  -We discussed the findings from the CT scan in detail (see below), no evidence of distant metastasis, his and his wife's questions were answered to satisfaction. -His cancer was resected with negative margins on 06/12/17 -We reviewed his risk of recurrence after surgical resection. Giving the early stage disease, no high-risk features, his risk is low. I recommend him to have routine follow-up.  -Will continue cancer surveillance consisting of labs/CEA and physical exam every 6 months. Routine surveillance CT scan is probably not necessary giving the stage I disease without high-risk features. -He is overdue for repeat colonoscopy in May 2019. I encourage him to contact his GI Dr. Wynetta Emery  -today's lab results are still pending  -He is clinically doing well, asymptomatic, exam was unremarkable, no clinical concern for recurrence.  I encouraged him to become more active with exercise and maintain a healthy diet and try to lose some weight.  -Follow up in 6 months with lab   2. Low density lesion in liver -His CT scan showed a small rounded low density seen peripherally in right hepatic lobe most likely represents a cyst -After visit, I discussed case with Dr. Nyoka Cowden, Radiologist, who compared 04/2017 CT C/A/P with 2016 CT abdomen and MRI abdomen. He confirms the area in question is the same size and  location as previously seen in 2016 and has not changed; confirms this is a cyst and does not require further testing.    3. Lung Nodule, benign  -4 mm left lung apex nodule on 04/25/17 CT Scan -He is low risk for lung cancer based on light smoking history  -It is likely benign, no need for follow-up a CT scan  4. Arthrosclerosis in his heart -Currently on Aspirin 81 mg daily and Lipitor 10 mg daily  -His last CT showed Coronary artery calcifications as well as aortic arthrosclerosis.  -I encouraged him to watch his cholesterol, diet and weight. If he develops significant chest pain he should contact his PCP and present to the ED.  -He will follow up with PCP   5. Anemia, probable iron deficient anemia  -Post-op anemia Hgb 9.9 on 06/13/17 -He began Ferrous sulfate in 07/2017 per his PCP, recently stopped due to normal iron level. -anemia resolved now   PLAN:  -Lab and f/u in 6 months  -will call him with lab results tomorrow    Orders Placed This Encounter  Procedures  . CBC with Differential (Cancer Center Only)    Standing Status:   Standing    Number of Occurrences:   20    Standing Expiration Date:   08/15/2023  . CMP ([facility] only)    Standing Status:   Standing    Number of Occurrences:   20    Standing Expiration Date:   08/15/2023    All questions were answered. The patient knows to call the clinic with any problems, questions or concerns. I spent 15 minutes counseling the patient face to face. The total time spent in the appointment was 20 minutes and more than 50% was on counseling.  I, Noor Dweik am acting as Education administrator for  Dr. Truitt Merle.  I have reviewed the above documentation for accuracy and completeness, and I agree with the above.    Truitt Merle, MD 08/14/2018

## 2018-08-14 ENCOUNTER — Inpatient Hospital Stay: Payer: Medicare HMO | Attending: Hematology | Admitting: Hematology

## 2018-08-14 ENCOUNTER — Encounter: Payer: Self-pay | Admitting: Hematology

## 2018-08-14 ENCOUNTER — Inpatient Hospital Stay: Payer: Medicare HMO

## 2018-08-14 ENCOUNTER — Telehealth: Payer: Self-pay

## 2018-08-14 VITALS — BP 133/99 | HR 56 | Temp 98.0°F | Resp 17 | Ht 67.0 in | Wt 190.1 lb

## 2018-08-14 DIAGNOSIS — C182 Malignant neoplasm of ascending colon: Secondary | ICD-10-CM

## 2018-08-14 DIAGNOSIS — R918 Other nonspecific abnormal finding of lung field: Secondary | ICD-10-CM | POA: Diagnosis not present

## 2018-08-14 DIAGNOSIS — Z87891 Personal history of nicotine dependence: Secondary | ICD-10-CM | POA: Diagnosis not present

## 2018-08-14 DIAGNOSIS — I10 Essential (primary) hypertension: Secondary | ICD-10-CM | POA: Diagnosis not present

## 2018-08-14 DIAGNOSIS — I2510 Atherosclerotic heart disease of native coronary artery without angina pectoris: Secondary | ICD-10-CM | POA: Diagnosis not present

## 2018-08-14 LAB — CBC WITH DIFFERENTIAL (CANCER CENTER ONLY)
BASOS ABS: 0 10*3/uL (ref 0.0–0.1)
BASOS PCT: 1 %
EOS ABS: 0.2 10*3/uL (ref 0.0–0.5)
EOS PCT: 4 %
HCT: 41.9 % (ref 38.4–49.9)
Hemoglobin: 13.6 g/dL (ref 13.0–17.1)
Lymphocytes Relative: 21 %
Lymphs Abs: 1.2 10*3/uL (ref 0.9–3.3)
MCH: 29.6 pg (ref 27.2–33.4)
MCHC: 32.5 g/dL (ref 32.0–36.0)
MCV: 91 fL (ref 79.3–98.0)
Monocytes Absolute: 0.5 10*3/uL (ref 0.1–0.9)
Monocytes Relative: 8 %
Neutro Abs: 3.9 10*3/uL (ref 1.5–6.5)
Neutrophils Relative %: 66 %
PLATELETS: 187 10*3/uL (ref 140–400)
RBC: 4.6 MIL/uL (ref 4.20–5.82)
RDW: 14.4 % (ref 11.0–14.6)
WBC: 5.8 10*3/uL (ref 4.0–10.3)

## 2018-08-14 LAB — CMP (CANCER CENTER ONLY)
ALT: 20 U/L (ref 0–44)
AST: 19 U/L (ref 15–41)
Albumin: 4 g/dL (ref 3.5–5.0)
Alkaline Phosphatase: 107 U/L (ref 38–126)
Anion gap: 8 (ref 5–15)
BUN: 20 mg/dL (ref 8–23)
CHLORIDE: 107 mmol/L (ref 98–111)
CO2: 26 mmol/L (ref 22–32)
CREATININE: 0.96 mg/dL (ref 0.61–1.24)
Calcium: 9.6 mg/dL (ref 8.9–10.3)
GFR, Est AFR Am: >60 mL/min (ref >60)
GFR, Estimated: >60 mL/min (ref >60)
Glucose, Bld: 96 mg/dL (ref 70–99)
Potassium: 4.4 mmol/L (ref 3.5–5.1)
SODIUM: 141 mmol/L (ref 135–145)
Total Bilirubin: 0.6 mg/dL (ref 0.3–1.2)
Total Protein: 7.1 g/dL (ref 6.5–8.1)

## 2018-08-14 LAB — CEA (IN HOUSE-CHCC): CEA (CHCC-IN HOUSE): 1.59 ng/mL (ref 0.00–5.00)

## 2018-08-14 NOTE — Telephone Encounter (Signed)
Printed avs and calender of upcoming appointment. per 9/12 los

## 2018-08-15 ENCOUNTER — Telehealth: Payer: Self-pay

## 2018-08-15 NOTE — Telephone Encounter (Signed)
Left voice message for patient per Dr. Burr Medico you are overdue for your colonscopy. Please contact Dr. Wynetta Emery your GI about having this done.

## 2018-08-15 NOTE — Telephone Encounter (Signed)
-----   Message from Truitt Merle, MD sent at 08/14/2018 10:50 PM EDT ----- His is overdue for colonoscopy, could you call pt or his wife and let them contact his GI Dr. Wynetta Emery? Or could you call Dr. Durenda Age office?  Thanks much,  Krista Blue

## 2018-08-19 ENCOUNTER — Telehealth: Payer: Self-pay

## 2018-08-19 DIAGNOSIS — R69 Illness, unspecified: Secondary | ICD-10-CM | POA: Diagnosis not present

## 2018-08-19 NOTE — Telephone Encounter (Signed)
-----   Message from Truitt Merle, MD sent at 08/16/2018 10:30 AM EDT ----- Please let pt know the CEA result, no concerns, thanks   Truitt Merle  08/16/2018

## 2018-08-19 NOTE — Telephone Encounter (Signed)
Left voice message for patient per Dr. Burr Medico, CEA results normal, no concerns.

## 2018-09-09 ENCOUNTER — Telehealth: Payer: Self-pay | Admitting: Neurology

## 2018-09-09 NOTE — Telephone Encounter (Signed)
Will need appt for that.  Can put him in 10/24 if want

## 2018-09-09 NOTE — Telephone Encounter (Signed)
Patient's wife is calling in wanting to speak with Luvenia Starch about some medications. She wanted to start him on the medication that Dr.Tat talked about previous visit and she couldn't remember the name. Please call her back at [phone]. Thanks!

## 2018-09-09 NOTE — Telephone Encounter (Signed)
Appt made with patient's wife.

## 2018-09-16 DIAGNOSIS — L578 Other skin changes due to chronic exposure to nonionizing radiation: Secondary | ICD-10-CM | POA: Diagnosis not present

## 2018-09-16 DIAGNOSIS — D485 Neoplasm of uncertain behavior of skin: Secondary | ICD-10-CM | POA: Diagnosis not present

## 2018-09-16 DIAGNOSIS — L570 Actinic keratosis: Secondary | ICD-10-CM | POA: Diagnosis not present

## 2018-09-18 DIAGNOSIS — R69 Illness, unspecified: Secondary | ICD-10-CM | POA: Diagnosis not present

## 2018-09-23 NOTE — Progress Notes (Signed)
Kevin Mathews was seen today in follow-up for newly diagnosed Parkinson's disease.  The patient is currently on no medication.  His wife called recently and wanted to start medication, but ultimately we decided to make an appointment to discuss that.  He is here today in that regard.  He has had no falls since last visit.  No lightheadedness or near syncope.  He is doing spin bike class 3 days per week.  He is scheduled to start an evaluation for rock steady boxing.  He has already gone and reviewed a class.  He is signed up for the arts class and is on a waiting list for the drumming class.  PREVIOUS MEDICATIONS: none to date  ALLERGIES:  No Known Allergies  CURRENT MEDICATIONS:  Outpatient Encounter Medications as of 09/25/2018  Medication Sig  . acetaminophen (TYLENOL) 325 MG tablet Take 650 mg by mouth every 6 (six) hours as needed.  Marland Kitchen aspirin EC 81 MG tablet Take 81 mg by mouth daily.  Marland Kitchen atorvastatin (LIPITOR) 10 MG tablet Take 10 mg by mouth every evening.   Marland Kitchen dexlansoprazole (DEXILANT) 60 MG capsule Take 60 mg by mouth daily as needed (acid reflux).  Marland Kitchen doxazosin (CARDURA) 4 MG tablet Take 4 mg by mouth every evening.   . finasteride (PROSCAR) 5 MG tablet Take 5 mg by mouth every evening.   . hydrochlorothiazide (MICROZIDE) 12.5 MG capsule Take 12.5 mg by mouth every evening.   Marland Kitchen ibuprofen (ADVIL,MOTRIN) 200 MG tablet Take 400 mg by mouth 2 (two) times daily as needed for headache or mild pain.   . Multiple Vitamins-Minerals (ADULT GUMMY PO) Take 4 tablets by mouth daily.   . [DISCONTINUED] docusate sodium (COLACE) 100 MG capsule Take 1 capsule (100 mg total) by mouth 2 (two) times daily. To prevent constipation while taking pain medication.   No facility-administered encounter medications on file as of 09/25/2018.     PAST MEDICAL HISTORY:   Past Medical History:  Diagnosis Date  . Arthritis   . BPH (benign prostatic hyperplasia)   . Dyspnea    with excertion  . Frequent  urination   . GERD (gastroesophageal reflux disease)    occasional  . History of kidney stones    passed  . Hypertension   . 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 COLONOSCOPY WITH PROPOFOL N/A 04/15/2017   Procedure: COLONOSCOPY WITH PROPOFOL;  Surgeon: Garlan Fair, MD;  Location: [facility];  Service: Endoscopy;  Laterality: N/A;  . LAPAROSCOPIC PARTIAL COLECTOMY N/A 06/12/2017   Procedure: LAPAROSCOPIC ASSISTED PARTIAL COLECTOMY;  Surgeon: Coralie Keens, MD;  Location: [facility];  Service: General;  Laterality: N/A;  . NASAL SINUS SURGERY  1979  . TOTAL SHOULDER ARTHROPLASTY Left 07/01/2013   Procedure: TOTAL SHOULDER ARTHROPLASTY;  Surgeon: Ninetta Lights, MD;  Location: [facility];  Service: Orthopedics;  Laterality: Left;  with interscalene block  . TOTAL SHOULDER ARTHROPLASTY Right 03/18/2018   Procedure: RIGHT TOTAL SHOULDER ARTHROPLASTY, BICEPS TENODESIS;  Surgeon: Renette Butters, MD;  Location: [facility];  Service: Orthopedics;  Laterality: Right;    SOCIAL HISTORY:   Social History   Socioeconomic History  . Marital status: Married    Spouse name: Not on file  . Number of children: Not on file  . Years of education: Not on file  . Highest education level: Not on file  Occupational  History  . 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 Financial resource strain: Not on file  . Food insecurity:    Worry: Not on file    Inability: Not on file  . Transportation needs:    Medical: Not on file    Non-medical: Not on file  Tobacco Use  . Smoking status: Former Smoker    Years: 3.00    Last attempt to quit: 12/03/1968    Years since quitting: 49.8  . Smokeless tobacco: Never Used  . Tobacco comment: not often  Substance and Sexual Activity  . Alcohol use: No  . Drug use: No  . Sexual activity: Yes  Lifestyle  . Physical  activity:    Days per week: Not on file    Minutes per session: Not on file  . Stress: Not on file  Relationships  . Social connections:    Talks on phone: Not on file    Gets together: Not on file    Attends religious service: Not on file    Active member of club or organization: Not on file    Attends meetings of clubs or organizations: Not on file    Relationship status: Not on file  . Intimate partner violence:    Fear of current or ex partner: Not on file    Emotionally abused: Not on file    Physically abused: Not on file    Forced sexual activity: Not on file  Other Topics Concern  . Not on file  Social History Narrative  . Not on file    FAMILY HISTORY:   Family Status  Relation Name Status  . Mother  Deceased  . Father  Deceased  . Brother  Alive  . Daughter  Alive  . Son  Alive    ROS:  Review of Systems  Constitutional: Negative.   HENT: Negative.   Eyes: Negative.   Respiratory: Negative.   Cardiovascular: Negative.   Gastrointestinal: Negative.   Genitourinary: Negative.   Musculoskeletal: Negative.   Skin: Negative.     PHYSICAL EXAMINATION:    VITALS:   Vitals:   09/25/18 1121  BP: 138/64  Pulse: 64  SpO2: 94%  Weight: 189 lb (85.7 kg)  Height: 5\' 7"  (1.702 m)    GEN:  The patient appears stated age and is in NAD. HEENT:  Normocephalic, atraumatic.  The mucous membranes are moist. The superficial temporal arteries are without ropiness or tenderness. CV:  RRR Lungs:  CTAB Neck/HEME:  There are no carotid bruits bilaterally.  Neurological examination:  Orientation: The patient is alert and oriented x3. Cranial nerves: There is good facial symmetry.  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  The speech is fluent and clear. Soft palate rises symmetrically and there is no tongue deviation. Hearing is intact to conversational tone. Sensation: Sensation is intact to light touch throughout Motor: Strength is 5/5 in the bilateral upper  and lower extremities.   Shoulder shrug is equal and symmetric.  There is no pronator drift.  Movement examination: Tone: There is slight increased tone in the left upper extremity. Abnormal movements: There is moderate amplitude intermittent left upper extremity resting tremor. Coordination:  There is mild decremation with RAM's, only with finger taps on the left Gait and Station: The patient has no difficulty arising out of a deep-seated chair without the use of the hands. The patient's stride length is normal.      Chemistry  Component Value Date/Time   NA 141 08/14/2018 1337   K 4.4 08/14/2018 1337   CL 107 08/14/2018 1337   CO2 26 08/14/2018 1337   BUN 20 08/14/2018 1337   CREATININE 0.96 08/14/2018 1337      Component Value Date/Time   CALCIUM 9.6 08/14/2018 1337   ALKPHOS 107 08/14/2018 1337   AST 19 08/14/2018 1337   ALT 20 08/14/2018 1337   BILITOT 0.6 08/14/2018 1337     Lab Results  Component Value Date   WBC 5.8 08/14/2018   HGB 13.6 08/14/2018   HCT 41.9 08/14/2018   MCV 91.0 08/14/2018   PLT 187 08/14/2018   Lab Results  Component Value Date   TSH 3.74 07/22/2018     ASSESSMENT/PLAN:  1.  Idiopathic Parkinson's disease, diagnosed August, 2019  -Patient would like to start levodopa.  Discussed extensively risks, benefits, and side effects.  Discussed risk of dyskinesia extensively.  Discussed new data regarding early exposure to levodopa.  He would like to start it.  Will work to carbidopa/levodopa 25/100, 1 tablet 3 times per day.  -Congratulated him on all of the exercise.  Discussed data regarding exercise.  -Discussed safety.  2.  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  Recommend he f/u with Dr. Claudean Kinds.    3.  Patient has a follow-up appointment in February.  He will keep that.  Much greater than 50% of this visit was spent in counseling and coordinating care.  Total face to face time:  25 min  Cc:  Lavone Orn,  MD

## 2018-09-25 ENCOUNTER — Ambulatory Visit: Payer: Medicare HMO | Admitting: Neurology

## 2018-09-25 ENCOUNTER — Encounter: Payer: Self-pay | Admitting: Neurology

## 2018-09-25 VITALS — BP 138/64 | HR 64 | Ht 67.0 in | Wt 189.0 lb

## 2018-09-25 DIAGNOSIS — G20 Parkinson's disease: Secondary | ICD-10-CM

## 2018-09-25 MED ORDER — CARBIDOPA-LEVODOPA 25-100 MG PO TABS: 1.0000 | ORAL_TABLET | Freq: Three times a day (TID) | ORAL | 1 refills | Status: DC

## 2018-09-25 NOTE — Patient Instructions (Signed)
Start Carbidopa Levodopa as follows:  Take 1/2 tablet three times daily, at least 30 minutes before meals, for one week  Then take 1/2 tablet in the morning, 1/2 tablet in the afternoon, 1 tablet in the evening, at least 30 minutes before meals, for one week  Then take 1/2 tablet in the morning, 1 tablet in the afternoon, 1 tablet in the evening, at least 30 minutes before meals, for one week  Then take 1 tablet three times daily, at least 30 minutes before meals  

## 2018-10-08 DIAGNOSIS — C189 Malignant neoplasm of colon, unspecified: Secondary | ICD-10-CM | POA: Diagnosis not present

## 2018-10-08 DIAGNOSIS — N400 Enlarged prostate without lower urinary tract symptoms: Secondary | ICD-10-CM | POA: Diagnosis not present

## 2018-10-08 DIAGNOSIS — I10 Essential (primary) hypertension: Secondary | ICD-10-CM | POA: Diagnosis not present

## 2018-10-08 DIAGNOSIS — Z85038 Personal history of other malignant neoplasm of large intestine: Secondary | ICD-10-CM | POA: Diagnosis not present

## 2018-10-08 DIAGNOSIS — E785 Hyperlipidemia, unspecified: Secondary | ICD-10-CM | POA: Diagnosis not present

## 2018-10-08 DIAGNOSIS — D509 Iron deficiency anemia, unspecified: Secondary | ICD-10-CM | POA: Diagnosis not present

## 2018-10-08 DIAGNOSIS — I2510 Atherosclerotic heart disease of native coronary artery without angina pectoris: Secondary | ICD-10-CM | POA: Diagnosis not present

## 2018-11-04 DIAGNOSIS — R69 Illness, unspecified: Secondary | ICD-10-CM | POA: Diagnosis not present

## 2018-11-17 DIAGNOSIS — R69 Illness, unspecified: Secondary | ICD-10-CM | POA: Diagnosis not present

## 2018-11-19 DIAGNOSIS — H2513 Age-related nuclear cataract, bilateral: Secondary | ICD-10-CM | POA: Diagnosis not present

## 2018-11-19 DIAGNOSIS — H524 Presbyopia: Secondary | ICD-10-CM | POA: Diagnosis not present

## 2018-11-19 DIAGNOSIS — H02403 Unspecified ptosis of bilateral eyelids: Secondary | ICD-10-CM | POA: Diagnosis not present

## 2018-11-19 DIAGNOSIS — H52203 Unspecified astigmatism, bilateral: Secondary | ICD-10-CM | POA: Diagnosis not present

## 2018-12-05 DIAGNOSIS — R69 Illness, unspecified: Secondary | ICD-10-CM | POA: Diagnosis not present

## 2018-12-29 DIAGNOSIS — R69 Illness, unspecified: Secondary | ICD-10-CM | POA: Diagnosis not present

## 2018-12-29 IMAGING — CR DG SHOULDER 2+V PORT*R*
1 series · 1 of 1 positions shown · non-contrast
Comparison: 01/02/2018

CLINICAL DATA: Followup right shoulder arthroplasty.

EXAM:
PORTABLE RIGHT SHOULDER

[AP]
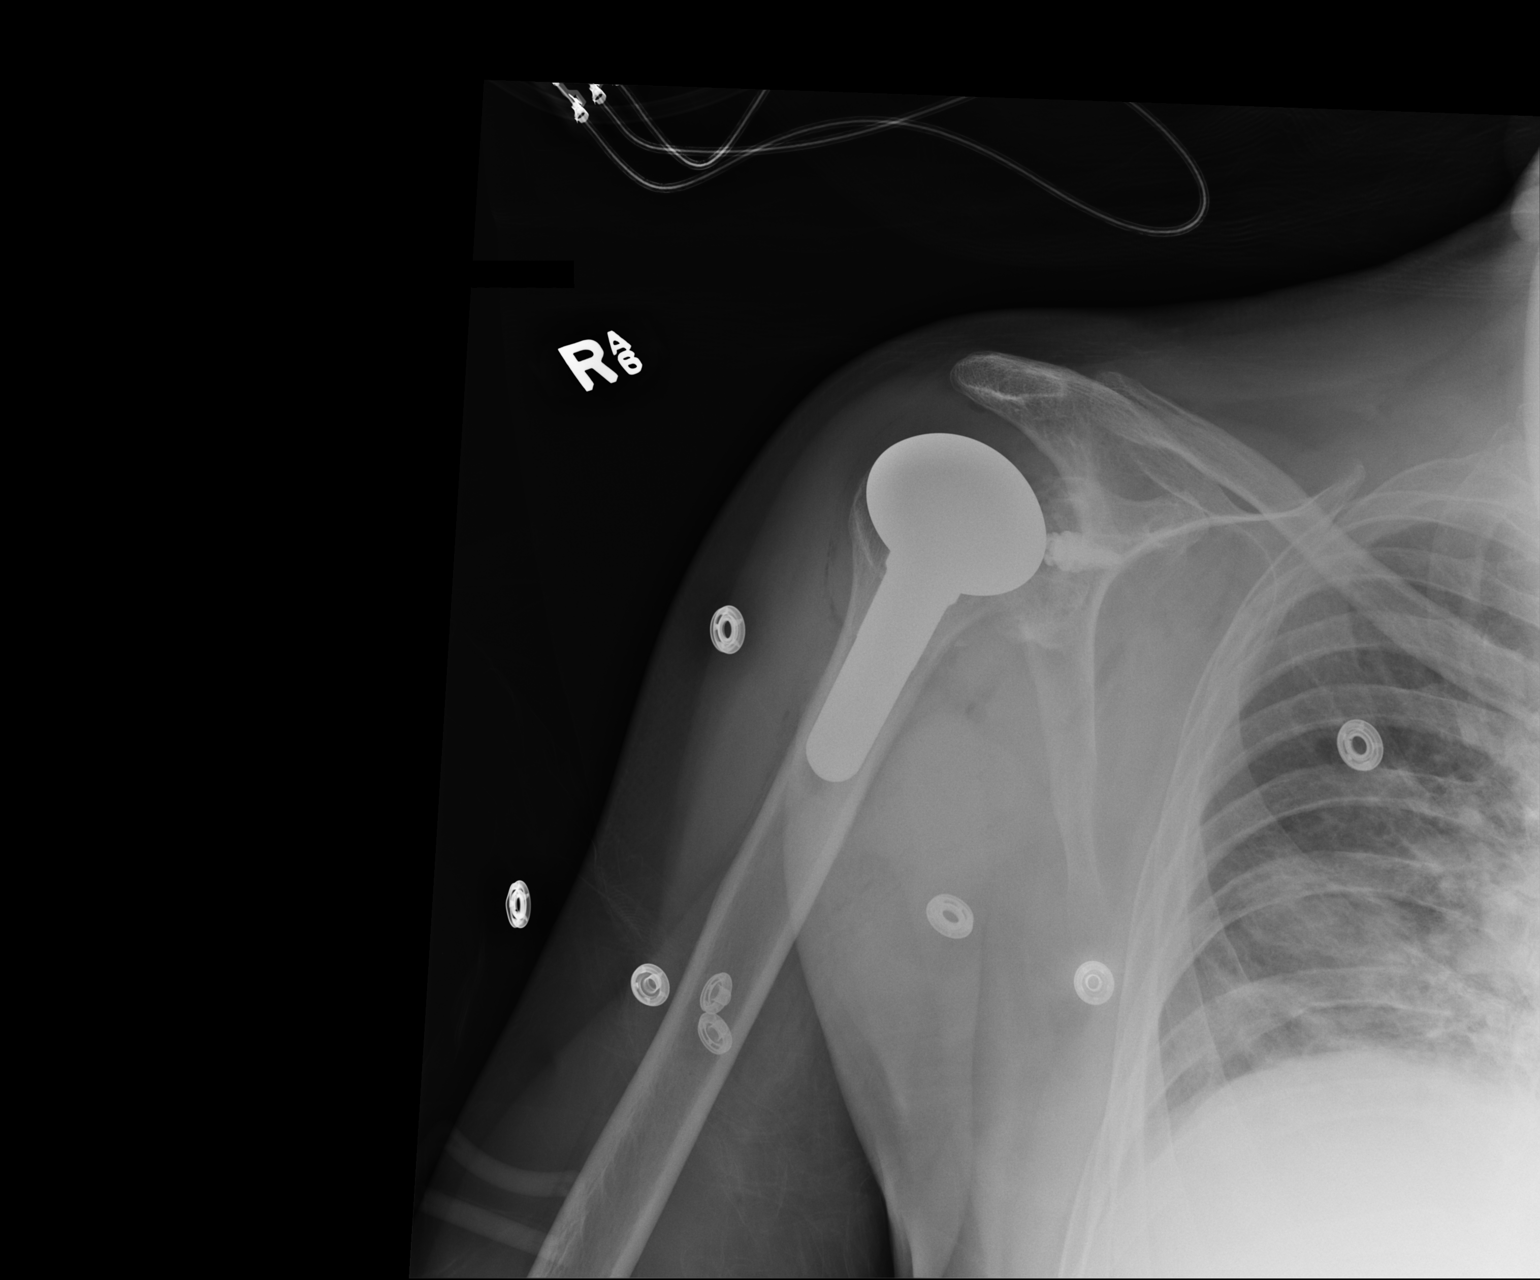

[1 of 1 positions shown; findings below may reference images not displayed]

FINDINGS: Single image shows right shoulder arthroplasty. Visible components
appear grossly well positioned. No sign of dislocation or
subluxation on this single view.
IMPRESSION: Good one view appearance following right shoulder arthroplasty.

## 2019-01-23 ENCOUNTER — Ambulatory Visit: Payer: Medicare HMO | Admitting: Neurology

## 2019-01-23 ENCOUNTER — Telehealth: Payer: Self-pay | Admitting: Hematology

## 2019-01-23 NOTE — Telephone Encounter (Signed)
Kevin Mathews 3/11 moved appoinments to 3/12. Spoke with spouse.

## 2019-01-29 DIAGNOSIS — I2510 Atherosclerotic heart disease of native coronary artery without angina pectoris: Secondary | ICD-10-CM | POA: Diagnosis not present

## 2019-01-29 DIAGNOSIS — I10 Essential (primary) hypertension: Secondary | ICD-10-CM | POA: Diagnosis not present

## 2019-01-29 DIAGNOSIS — Z1389 Encounter for screening for other disorder: Secondary | ICD-10-CM | POA: Diagnosis not present

## 2019-01-29 DIAGNOSIS — R0683 Snoring: Secondary | ICD-10-CM | POA: Diagnosis not present

## 2019-01-29 DIAGNOSIS — Z85038 Personal history of other malignant neoplasm of large intestine: Secondary | ICD-10-CM | POA: Diagnosis not present

## 2019-01-29 DIAGNOSIS — N400 Enlarged prostate without lower urinary tract symptoms: Secondary | ICD-10-CM | POA: Diagnosis not present

## 2019-01-29 DIAGNOSIS — Z0000 Encounter for general adult medical examination without abnormal findings: Secondary | ICD-10-CM | POA: Diagnosis not present

## 2019-01-29 DIAGNOSIS — E7800 Pure hypercholesterolemia, unspecified: Secondary | ICD-10-CM | POA: Diagnosis not present

## 2019-01-29 DIAGNOSIS — G20 Parkinson's disease: Secondary | ICD-10-CM | POA: Diagnosis not present

## 2019-02-10 NOTE — Progress Notes (Signed)
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 Resected and retrieved. - Tumor in the proximal ascending colon. Biopsied. - The examination was otherwise normal.     04/15/2017 Pathology Results    Diagnosis 04/15/17 by Dr. Wynetta Emery  1. Colon, biopsy, at junction of cecum and ascending - INVASIVE ADENOCARCINOMA 2. Colon, polyp(s), descending - BENIGN COLORECTAL MUCOSA. - ASSOCIATED BENIGN LYMPHOID AGGREGATES. - MELANOSIS COLI PRESENT. - NO DYSPLASIA OR MALIGNANCY IDENTIFIED. Microscopic Comment 1. Dr. Lyndon Code has seen the first specimen (cecal and ascending colon biopsy) in consultation with agreement. The findings are called to [facility] who is taking the result for Dr. Wynetta Emery on 04/16/2017.    04/15/2017 Initial Diagnosis    Malignant neoplasm of ascending colon s/p ileocolectomy 06/12/2017    04/25/2017 Imaging    CT CAP W Contrast 04/25/17 IMPRESSION: Moderate size sliding-type hiatal hernia. Coronary artery calcifications are noted suggesting coronary artery disease. Scarring is noted in both lungs. 4 mm nodule is noted in left lung apex. No follow-up needed if patient is low-risk.  Non-contrast chest CT can be considered in 12 months if patient is high-risk. This recommendation follows the consensus statement: Guidelines for Management of Incidental Pulmonary Nodules Detected on CT Images: From the Fleischner Society 2017; Radiology 2017; 284:228-243. Small rounded low density seen peripherally in right hepatic lobe which most likely represent cyst, but metastatic disease cannot be excluded given the history of colonic malignancy. Continued follow-up is recommended. 16 mm soft tissue density seen in the cecum which may represent neoplasm or malignancy, as noted on colonoscopy. Aortic atherosclerosis. Stable prostatic enlargement.    06/12/2017 Surgery    LAPAROSCOPIC ASSISTED PARTIAL COLECTOMY by Dr. Ninfa Linden    The Sage port was placed to the opening and insufflation of the abdomen was begun. 45 [name] reports then placed the patient's upper midline under direct vision. I could easily identify the cecum and ascending colon. There was a tattoo where the colonoscopy biopsy was performed indicating the level of the cancer. I saw no other intra-abdominal pathology. The liver appeared normal. I next mobilized the right colon along the white line of [place] free of the appendix is well with harmonic scalpel. I then took down the hepatic flexure freed gallbladder off of the colon with the Harmonic scalpel as well. The entire right colon became easily mobile and was able to easily move past the midline. At this point I converted to the open portion of procedure. I removed all ports. I created an incision between the 2 five-minute reports with scalpel. I then took this down through the fascia with electrocautery. The peritoneum was then opened entirely to the incision. I was then able to easily eviscerate the cecum and right  colon and distal ileum. I transected the distal ileum with the GIA 75 stapler. I then transected the transverse colon proximally with the GIA 75 staplers well.  I then took down the mesentery with the LigaSure cautery device. I easily palpate the mass inside the colon. The specimen was sent to pathology for evaluation. I then reapproximated the small bowel to the proximal colon in a side-to-side fashion with interrupted silk sutures. I performed a colostomy and enterotomy with the cautery and performed a side-to-side anastomosis with a single firing the GIA 75 stapler. The opening was then closed with TA 60 stapler. I evaluated stable on hemostasis appeared to be achieved prior to closing with a TA 60. I then reinforced stapler with interrupted silk sutures and closed the mesenteric defect silk sutures as well. Again the anastomosis appeared pink and well-perfused.  I then placed colon back into the abdomen. I then irrigated the abdomen with several liters normal saline. Hemostasis again appeared to be achieved.    06/12/2017 Pathology Results    Diagnosis 06/12/17 Colon, segmental resection for tumor, right colon - ADENOCARCINOMA, MODERATELY DIFFERENTIATED - NO CARCINOMA IDENTIFIED IN NINETEEN LYMPH NODES (0/19) - MARGINS UNINVOLVED BY CARCINOMA - BENIGN APPENDIX WITH LUMINAL FIBROSIS - SEE ONCOLOGY TABLE BELOW     CURRENT THERAPY: Colon cancer surveillance    INTERVAL HISTORY  Kevin Mathews is here for a follow up of his colon cancer. He is accompanied by his wife to the clinic today.  He is doing well overall, denies any significant pain, abdominal discomfort, diarrhea or hematochezia.  He has good appetite and energy level.  He goes to gym exercise 4 to 5 times a week.  His follows Dr. Carles Collet for parkinsonism.     MEDICAL HISTORY:  Past Medical History:  Diagnosis Date  . Arthritis   . BPH (benign prostatic hyperplasia)   . Dyspnea    with excertion  . Frequent urination   . GERD (gastroesophageal reflux disease)    occasional  . History of kidney stones    passed  . Hypertension   . Malignant neoplasm of ascending colon s/p ileocolectomy  06/12/2017 06/12/2017    SURGICAL HISTORY: Past Surgical History:  Procedure Laterality Date  . COLONOSCOPY WITH PROPOFOL N/A 04/15/2017   Procedure: COLONOSCOPY WITH PROPOFOL;  Surgeon: Garlan Fair, MD;  Location: [facility];  Service: Endoscopy;  Laterality: N/A;  . LAPAROSCOPIC PARTIAL COLECTOMY N/A 06/12/2017   Procedure: LAPAROSCOPIC ASSISTED PARTIAL COLECTOMY;  Surgeon: Coralie Keens, MD;  Location: [facility];  Service: General;  Laterality: N/A;  . NASAL SINUS SURGERY  1979  . TOTAL SHOULDER ARTHROPLASTY Left 07/01/2013   Procedure: TOTAL SHOULDER ARTHROPLASTY;  Surgeon: Ninetta Lights, MD;  Location: [place];  Service: Orthopedics;  Laterality: Left;  with interscalene block  . TOTAL SHOULDER ARTHROPLASTY Right 03/18/2018   Procedure: RIGHT TOTAL SHOULDER ARTHROPLASTY, BICEPS TENODESIS;  Surgeon: Renette Butters, MD;  Location: [place];  Service: Orthopedics;  Laterality: Right;    SOCIAL HISTORY: Social History   Socioeconomic History  . Marital status: Married    Spouse name: Not on file  . Number of children: Not on file  . Years of education: Not on file  . Highest education level: Not on file  Occupational History  . 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 Financial resource strain: Not on file  . Food insecurity:    Worry: Not on file    Inability: Not  on file  . Transportation needs:    Medical: Not on file    Non-medical: Not on file  Tobacco Use  . Smoking status: Former Smoker    Years: 3.00    Last attempt to quit: 12/03/1968    Years since quitting: 50.2  . Smokeless tobacco: Never Used  . Tobacco comment: not often  Substance and Sexual Activity  . Alcohol use: No  . Drug use: No  . Sexual activity: Yes  Lifestyle  . Physical activity:    Days per week: Not on file    Minutes per session: Not on file  . Stress: Not on file  Relationships  . Social connections:    Talks on phone: Not on file    Gets together: Not on  file    Attends religious service: Not on file    Active member of club or organization: Not on file    Attends meetings of clubs or organizations: Not on file    Relationship status: Not on file  . Intimate partner violence:    Fear of current or ex partner: Not on file    Emotionally abused: Not on file    Physically abused: Not on file    Forced sexual activity: Not on file  Other Topics Concern  . Not on file  Social History Narrative  . Not on file    FAMILY HISTORY: Family History  Problem Relation Age of Onset  . Hypertension Father   . Healthy Daughter   . Healthy Son    He denies family history of cancer.   ALLERGIES:  has No Known Allergies.  MEDICATIONS:  Current Outpatient Medications  Medication Sig Dispense Refill  . acetaminophen (TYLENOL) 325 MG tablet Take 650 mg by mouth every 6 (six) hours as needed.    Marland Kitchen aspirin EC 81 MG tablet Take 81 mg by mouth daily.    Marland Kitchen atorvastatin (LIPITOR) 10 MG tablet Take 10 mg by mouth every evening.     . carbidopa-levodopa (SINEMET IR) 25-100 MG tablet Take 1 tablet by mouth 3 (three) times daily. 270 tablet 1  . dexlansoprazole (DEXILANT) 60 MG capsule Take 60 mg by mouth daily as needed (acid reflux).    Marland Kitchen doxazosin (CARDURA) 4 MG tablet Take 4 mg by mouth every evening.     . finasteride (PROSCAR) 5 MG tablet Take 5 mg by mouth every evening.     . hydrochlorothiazide (MICROZIDE) 12.5 MG capsule Take 12.5 mg by mouth every evening.     Marland Kitchen ibuprofen (ADVIL,MOTRIN) 200 MG tablet Take 400 mg by mouth 2 (two) times daily as needed for headache or mild pain.     . Multiple Vitamins-Minerals (ADULT GUMMY PO) Take 4 tablets by mouth daily.      No current facility-administered medications for this visit.     REVIEW OF SYSTEMS:   Constitutional: Denies fevers, chills or abnormal night sweats  Eyes: Denies blurriness of vision, double vision or watery eyes Ears, nose, mouth, throat, and face: Denies mucositis or sore  throat Respiratory: Denies dyspnea or wheezes  Cardiovascular: Denies palpitation, chest discomfort or lower extremity swelling Gastrointestinal:  Denies nausea, vomiting, constipation, or diarrhea. Skin: Denies abnormal skin rashes Lymphatics: Denies new lymphadenopathy or easy bruising Neurological:Denies numbness, tingling or new weaknesses parkinsonism, resting hand tremors  Behavioral/Psych: Mood is stable, no new changes  All other systems were reviewed with the patient and are negative.  PHYSICAL EXAMINATION:  ECOG PERFORMANCE STATUS: 0  Vitals:  02/12/19 1341  BP: 109/63  Pulse: (!) 51  Resp: 16  Temp: 98.1 F (36.7 C)  SpO2: 100%   Filed Weights   02/12/19 1341  Weight: 189 lb 11.2 oz (86 kg)     GENERAL:alert, no distress and comfortable SKIN: skin color, texture, turgor are normal, no rashes or significant lesions EYES: normal, conjunctiva are pink and non-injected, sclera clear OROPHARYNX:no exudate, no erythema and lips, buccal mucosa, and tongue normal  NECK: supple, thyroid normal size, non-tender, without nodularity LYMPH:  no palpable cervical or supraclavicular lymphadenopathy LUNGS: clear to auscultation bilaterally, normal breathing effort HEART: regular rate & rhythm, S1 and S2 present, no murmurs. no lower extremity edema ABDOMEN:abdomen soft, non-tender and normal bowel sounds (+) s/p ileocolectomy, upper midline abd incision pink, healing well, non-tender. Musculoskeletal:no cyanosis of digits and no clubbing  PSYCH: alert & oriented x 3 with fluent speech NEURO: no focal motor/sensory deficits  LABORATORY DATA:  I have reviewed the data as listed CBC Latest Ref Rng & Units 02/12/2019 08/14/2018 03/07/2018  WBC 4.0 - 10.5 K/uL 5.8 5.8 7.6  Hemoglobin 13.0 - 17.0 g/dL 14.0 13.6 14.8  Hematocrit 39.0 - 52.0 % 43.5 41.9 44.8  Platelets 150 - 400 K/uL 188 187 233    CMP Latest Ref Rng & Units 02/12/2019 08/14/2018 03/07/2018  Glucose 70 - 99 mg/dL 96 96  106(H)  BUN 8 - 23 mg/dL 22 20 21(H)  Creatinine 0.61 - 1.24 mg/dL 0.96 0.96 1.03  Sodium 135 - 145 mmol/L 142 141 139  Potassium 3.5 - 5.1 mmol/L 4.1 4.4 4.2  Chloride 98 - 111 mmol/L 109 107 105  CO2 22 - 32 mmol/L 25 26 23   Calcium 8.9 - 10.3 mg/dL 9.2 9.6 9.5  Total Protein 6.5 - 8.1 g/dL 7.2 7.1 -  Total Bilirubin 0.3 - 1.2 mg/dL 0.7 0.6 -  Alkaline Phos 38 - 126 U/L 97 107 -  AST 15 - 41 U/L 16 19 -  ALT 0 - 44 U/L <6 20 -   PROCEDURES  Colonoscopy 04/15/17 IMPRESSION - One 5 mm polyp in the proximal descending colon, removed with a cold snare. Resected and retrieved. - Tumor in the proximal ascending colon. Biopsied. - The examination was otherwise normal.  RADIOGRAPHIC STUDIES: I have personally reviewed the radiological images as listed and agreed with the findings in the report. No results found.   CT CAP W Contrast 04/25/17 IMPRESSION: Moderate size sliding-type hiatal hernia. Coronary artery calcifications are noted suggesting coronary artery disease. Scarring is noted in both lungs. 4 mm nodule is noted in left lung apex. No follow-up needed if patient is low-risk. Non-contrast chest CT can be considered in 12 months if patient is high-risk. This recommendation follows the consensus statement: Guidelines for Management of Incidental Pulmonary Nodules Detected on CT Images: From the Fleischner Society 2017; Radiology 2017; 284:228-243. Small rounded low density seen peripherally in right hepatic lobe which most likely represent cyst, but metastatic disease cannot be excluded given the history of colonic malignancy. Continued follow-up is recommended. 16 mm soft tissue density seen in the cecum which may represent neoplasm or malignancy, as noted on colonoscopy. Aortic atherosclerosis. Stable prostatic enlargement.  ASSESSMENT & PLAN:  Kevin Mathews is a 78 y.o. caucasian male with a history of GERD, HTN, and osteoarthritis with newly diagnosed stage I  colon cancer s/p partial ileocolectomy on 06/12/2017.   1. Malignant neoplasm of ascending colon, pT2N0M0, stage I, G2 -he was diagnosed in 2018,s/p  partial  ileocolectomy on 06/12/2017.  -Repeated colonoscopy in 2018 was unremarkable -He is clinically doing well, asymptomatic, exam was unremarkable, lab reviewed, CBC and CMP are within normal limits, CEA still pending, no clinical concern for recurrence.   -He is 2 years out of his initial diagnosis, risk of recurrence has decreased further -He will see his primary care physician in 5 to 6 months, I will see him back in 1 year.  Continue surveillance for total of 5 years.   2. Low density lesion in liver -His CT scan showed a small rounded low density seen peripherally in right hepatic lobe most likely represents a cyst -After visit, I discussed case with Dr. Nyoka Cowden, Radiologist, who compared 04/2017 CT C/A/P with 2016 CT abdomen and MRI abdomen. He confirms the area in question is the same size and location as previously seen in 2016 and has not changed; confirms this is a cyst and does not require further testing.    3. Lung Nodule, benign  -4 mm left lung apex nodule on 04/25/17 CT Scan -He is low risk for lung cancer based on light smoking history  -It is likely benign, no need for follow-up a CT scan  4. Parkinson disease  -f/u with Tat   PLAN:  -Lab and f/u in 12 months    No orders of the defined types were placed in this encounter.   All questions were answered. The patient knows to call the clinic with any problems, questions or concerns. I spent 15 minutes counseling the patient face to face. The total time spent in the appointment was 20 minutes and more than 50% was on counseling.     Truitt Merle, MD 02/12/2019

## 2019-02-11 ENCOUNTER — Other Ambulatory Visit: Payer: Medicare HMO

## 2019-02-11 ENCOUNTER — Ambulatory Visit: Payer: Medicare HMO | Admitting: Hematology

## 2019-02-12 ENCOUNTER — Inpatient Hospital Stay: Payer: Medicare HMO | Admitting: Hematology

## 2019-02-12 ENCOUNTER — Inpatient Hospital Stay: Payer: Medicare HMO | Attending: Hematology

## 2019-02-12 ENCOUNTER — Other Ambulatory Visit: Payer: Self-pay

## 2019-02-12 ENCOUNTER — Telehealth: Payer: Self-pay | Admitting: Hematology

## 2019-02-12 VITALS — BP 109/63 | HR 51 | Temp 98.1°F | Resp 16 | Ht 67.0 in | Wt 189.7 lb

## 2019-02-12 DIAGNOSIS — I10 Essential (primary) hypertension: Secondary | ICD-10-CM

## 2019-02-12 DIAGNOSIS — K769 Liver disease, unspecified: Secondary | ICD-10-CM | POA: Diagnosis not present

## 2019-02-12 DIAGNOSIS — C182 Malignant neoplasm of ascending colon: Secondary | ICD-10-CM | POA: Insufficient documentation

## 2019-02-12 DIAGNOSIS — N400 Enlarged prostate without lower urinary tract symptoms: Secondary | ICD-10-CM | POA: Diagnosis not present

## 2019-02-12 DIAGNOSIS — Z87891 Personal history of nicotine dependence: Secondary | ICD-10-CM | POA: Insufficient documentation

## 2019-02-12 DIAGNOSIS — Z85038 Personal history of other malignant neoplasm of large intestine: Secondary | ICD-10-CM | POA: Diagnosis not present

## 2019-02-12 DIAGNOSIS — I2510 Atherosclerotic heart disease of native coronary artery without angina pectoris: Secondary | ICD-10-CM | POA: Diagnosis not present

## 2019-02-12 LAB — CMP (CANCER CENTER ONLY)
ALT: <6 U/L (ref 0–44)
AST: 16 U/L (ref 15–41)
Albumin: 4 g/dL (ref 3.5–5.0)
Alkaline Phosphatase: 97 U/L (ref 38–126)
Anion gap: 8 (ref 5–15)
BUN: 22 mg/dL (ref 8–23)
CO2: 25 mmol/L (ref 22–32)
CREATININE: 0.96 mg/dL (ref 0.61–1.24)
Calcium: 9.2 mg/dL (ref 8.9–10.3)
Chloride: 109 mmol/L (ref 98–111)
GFR, Est AFR Am: >60 mL/min (ref >60)
GFR, Estimated: >60 mL/min (ref >60)
Glucose, Bld: 96 mg/dL (ref 70–99)
Potassium: 4.1 mmol/L (ref 3.5–5.1)
Sodium: 142 mmol/L (ref 135–145)
Total Bilirubin: 0.7 mg/dL (ref 0.3–1.2)
Total Protein: 7.2 g/dL (ref 6.5–8.1)

## 2019-02-12 LAB — CBC WITH DIFFERENTIAL (CANCER CENTER ONLY)
Abs Immature Granulocytes: 0.06 10*3/uL (ref 0.00–0.07)
Basophils Absolute: 0 10*3/uL (ref 0.0–0.1)
Basophils Relative: 1 %
Eosinophils Absolute: 0.3 10*3/uL (ref 0.0–0.5)
Eosinophils Relative: 5 %
HCT: 43.5 % (ref 39.0–52.0)
Hemoglobin: 14 g/dL (ref 13.0–17.0)
Immature Granulocytes: 1 %
LYMPHS ABS: 1.2 10*3/uL (ref 0.7–4.0)
Lymphocytes Relative: 21 %
MCH: 29.7 pg (ref 26.0–34.0)
MCHC: 32.2 g/dL (ref 30.0–36.0)
MCV: 92.4 fL (ref 80.0–100.0)
Monocytes Absolute: 0.5 10*3/uL (ref 0.1–1.0)
Monocytes Relative: 8 %
NRBC: 0 % (ref 0.0–0.2)
Neutro Abs: 3.8 10*3/uL (ref 1.7–7.7)
Neutrophils Relative %: 64 %
Platelet Count: 188 10*3/uL (ref 150–400)
RBC: 4.71 MIL/uL (ref 4.22–5.81)
RDW: 13.7 % (ref 11.5–15.5)
WBC Count: 5.8 10*3/uL (ref 4.0–10.5)

## 2019-02-12 LAB — CEA (IN HOUSE-CHCC): CEA (CHCC-In House): 1.25 ng/mL (ref 0.00–5.00)

## 2019-02-12 NOTE — Telephone Encounter (Signed)
Scheduled appt per 3/12 los. ° °Printed calendar and avs. ° °

## 2019-02-13 ENCOUNTER — Encounter: Payer: Self-pay | Admitting: Hematology

## 2019-02-16 ENCOUNTER — Telehealth: Payer: Self-pay

## 2019-02-16 NOTE — Telephone Encounter (Signed)
Patient unavailable, so spoke with patient's wife regarding lab results, per Dr. Burr Medico informed her CBC, CMP and CEA were all normal last week.  She verbalized an understanding and appreciated the call.

## 2019-02-16 NOTE — Telephone Encounter (Signed)
-----   Message from Truitt Merle, MD sent at 02/14/2019 10:15 AM EDT ----- Please let pt know his lab CBC, CMP and CEA were all normal from last week, thanks   Truitt Merle  02/14/2019

## 2019-02-24 DIAGNOSIS — R69 Illness, unspecified: Secondary | ICD-10-CM | POA: Diagnosis not present

## 2019-04-01 DIAGNOSIS — R69 Illness, unspecified: Secondary | ICD-10-CM | POA: Diagnosis not present

## 2019-04-07 NOTE — Progress Notes (Signed)
Virtual Visit via Video Note The purpose of this virtual visit is to provide medical care while limiting exposure to the novel coronavirus.    Consent was obtained for video visit:  Yes.   Answered questions that patient had about telehealth interaction:  Yes.   I discussed the limitations, risks, security and privacy concerns of performing an evaluation and management service by telemedicine. I also discussed with the patient that there may be a patient responsible charge related to this service. The patient expressed understanding and agreed to proceed.  Pt location: Home Physician Location: home Name of referring provider:  Lavone Orn, MD I connected with Kevin Mathews at patients initiation/request on 04/09/2019 at  9:30 AM EDT by video enabled telemedicine application and verified that I am speaking with the correct person using two identifiers. Pt MRN:  [id number] Pt DOB:  [birth year] Video Participants:  Kevin Mathews;  wife   History of Present Illness:  Patient seen today in follow-up for Parkinson's disease.  I last saw him in October, 2019, at which point we started him on carbidopa/levodopa and worked to 1 tablet 3 times per day.  He reports that he still shakes (no worse).  Pt denies falls.  Pt denies lightheadedness, near syncope.  No hallucinations.  Mood has been up and down (he is tired of being stuck in the house).  The records that were made available to me were reviewed.  He was seen by oncology on February 12, 2019 as a follow-up of his colon cancer.  His labs, including CEA were all normal and he continues to do well from that standpoint.  Current Outpatient Medications on File Prior to Visit  Medication Sig Dispense Refill  . acetaminophen (TYLENOL) 325 MG tablet Take 650 mg by mouth every 6 (six) hours as needed.    Marland Kitchen aspirin EC 81 MG tablet Take 81 mg by mouth daily.    Marland Kitchen atorvastatin (LIPITOR) 10 MG tablet Take 10 mg by mouth every evening.     .  carbidopa-levodopa (SINEMET IR) 25-100 MG tablet Take 1 tablet by mouth 3 (three) times daily. 270 tablet 1  . dexlansoprazole (DEXILANT) 60 MG capsule Take 60 mg by mouth daily as needed (acid reflux).    Marland Kitchen doxazosin (CARDURA) 4 MG tablet Take 4 mg by mouth every evening.     . finasteride (PROSCAR) 5 MG tablet Take 5 mg by mouth every evening.     . hydrochlorothiazide (MICROZIDE) 12.5 MG capsule Take 12.5 mg by mouth every evening.     Marland Kitchen ibuprofen (ADVIL,MOTRIN) 200 MG tablet Take 400 mg by mouth 2 (two) times daily as needed for headache or mild pain.     . Multiple Vitamins-Minerals (ADULT GUMMY PO) Take 4 tablets by mouth daily.      No current facility-administered medications on file prior to visit.       Observations/Objective:   Vitals:   04/09/19 0831  BP: 127/62  Weight: 185 lb (83.9 kg)  Height: 5\' 7"  (1.702 m)   GEN:  The patient appears stated age and is in NAD.  Neurological examination:  Orientation: The patient is alert and oriented x3. Cranial nerves: There is good facial symmetry. There is mild facial hypomimia.  The speech is fluent and clear. Soft palate rises symmetrically and there is no tongue deviation. Hearing is intact to conversational tone. Motor: Strength is at least antigravity x 4.   Shoulder shrug is equal and symmetric.  There is no  pronator drift.  Movement examination: Tone: unable Abnormal movements: there is LUE resting tremor Coordination:  There is no decremation with RAM's, with any form of RAMS, including alternating supination and pronation of the forearm, hand opening and closing, finger taps, heel taps and toe taps. Gait and Station: The patient pushes off of the chair to arise. The patient's stride length is good with LUE resting tremor and decreased arm swing on the L.      Chemistry      Component Value Date/Time   NA 142 02/12/2019 1325   K 4.1 02/12/2019 1325   CL 109 02/12/2019 1325   CO2 25 02/12/2019 1325   BUN 22  02/12/2019 1325   CREATININE 0.96 02/12/2019 1325      Component Value Date/Time   CALCIUM 9.2 02/12/2019 1325   ALKPHOS 97 02/12/2019 1325   AST 16 02/12/2019 1325   ALT <6 02/12/2019 1325   BILITOT 0.7 02/12/2019 1325         Assessment and Plan:   1.  Parkinson's disease, diagnosed August, 2019  -Continue carbidopa/levodopa 25/100, 1 tablet 3 times per day.  Prescription was refilled today.  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  -Discussed importance of exercise, even when he is homebound during the pandemic.  He is very excited to be able to get back to the gym, when that opens back up.  -Discussed in detail Covid-19 and risk factors for this, including age and PD.  Discussed importance of social distancing.  Discussed importance of staying home at all times, as is feasible.  Discussed taking advantage of grocery store hours for the elderly.  Pt expressed understanding.   2.  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 The patient was provided an opportunity to ask questions and all were answered. The patient agreed with the plan and demonstrated an understanding of the instructions.   The patient was advised to call back or seek an in-person evaluation if the symptoms worsen or if the condition fails to improve as anticipated.    Total Time spent in visit with the patient was:  15 min, of which more than 50% of the time was spent in counseling and/or coordinating care on safety.   Pt understands and agrees with the plan of care outlined.     Alonza Bogus, DO

## 2019-04-09 ENCOUNTER — Encounter: Payer: Self-pay | Admitting: Neurology

## 2019-04-09 ENCOUNTER — Telehealth (INDEPENDENT_AMBULATORY_CARE_PROVIDER_SITE_OTHER): Payer: Medicare HMO | Admitting: Neurology

## 2019-04-09 ENCOUNTER — Other Ambulatory Visit: Payer: Self-pay

## 2019-04-09 DIAGNOSIS — G20 Parkinson's disease: Secondary | ICD-10-CM | POA: Diagnosis not present

## 2019-04-09 MED ORDER — CARBIDOPA-LEVODOPA 25-100 MG PO TABS: 1.0000 | ORAL_TABLET | Freq: Three times a day (TID) | ORAL | 1 refills | Status: DC

## 2019-04-14 ENCOUNTER — Telehealth: Payer: Self-pay | Admitting: Neurology

## 2019-04-14 NOTE — Telephone Encounter (Signed)
Contacted by [facility] to state that they believe pt not taking carbidopa/levodopa 25/100 based on RF hx.  Just saw the patient and he stated was taking it.  Will address next visit.

## 2019-04-29 DIAGNOSIS — R69 Illness, unspecified: Secondary | ICD-10-CM | POA: Diagnosis not present

## 2019-05-01 DIAGNOSIS — I2510 Atherosclerotic heart disease of native coronary artery without angina pectoris: Secondary | ICD-10-CM | POA: Diagnosis not present

## 2019-05-01 DIAGNOSIS — I10 Essential (primary) hypertension: Secondary | ICD-10-CM | POA: Diagnosis not present

## 2019-05-01 DIAGNOSIS — N400 Enlarged prostate without lower urinary tract symptoms: Secondary | ICD-10-CM | POA: Diagnosis not present

## 2019-05-01 DIAGNOSIS — Z85038 Personal history of other malignant neoplasm of large intestine: Secondary | ICD-10-CM | POA: Diagnosis not present

## 2019-05-05 DIAGNOSIS — I10 Essential (primary) hypertension: Secondary | ICD-10-CM | POA: Diagnosis not present

## 2019-05-05 DIAGNOSIS — Z85038 Personal history of other malignant neoplasm of large intestine: Secondary | ICD-10-CM | POA: Diagnosis not present

## 2019-05-05 DIAGNOSIS — N400 Enlarged prostate without lower urinary tract symptoms: Secondary | ICD-10-CM | POA: Diagnosis not present

## 2019-05-05 DIAGNOSIS — I2510 Atherosclerotic heart disease of native coronary artery without angina pectoris: Secondary | ICD-10-CM | POA: Diagnosis not present

## 2019-05-07 ENCOUNTER — Ambulatory Visit: Payer: Medicare HMO | Admitting: Neurology

## 2019-05-07 ENCOUNTER — Encounter

## 2019-07-02 DIAGNOSIS — R972 Elevated prostate specific antigen [PSA]: Secondary | ICD-10-CM | POA: Diagnosis not present

## 2019-07-02 DIAGNOSIS — N401 Enlarged prostate with lower urinary tract symptoms: Secondary | ICD-10-CM | POA: Diagnosis not present

## 2019-07-02 DIAGNOSIS — R3914 Feeling of incomplete bladder emptying: Secondary | ICD-10-CM | POA: Diagnosis not present

## 2019-07-30 DIAGNOSIS — I10 Essential (primary) hypertension: Secondary | ICD-10-CM | POA: Diagnosis not present

## 2019-07-30 DIAGNOSIS — N400 Enlarged prostate without lower urinary tract symptoms: Secondary | ICD-10-CM | POA: Diagnosis not present

## 2019-07-30 DIAGNOSIS — I700 Atherosclerosis of aorta: Secondary | ICD-10-CM | POA: Diagnosis not present

## 2019-07-30 DIAGNOSIS — G20 Parkinson's disease: Secondary | ICD-10-CM | POA: Diagnosis not present

## 2019-07-30 DIAGNOSIS — I2510 Atherosclerotic heart disease of native coronary artery without angina pectoris: Secondary | ICD-10-CM | POA: Diagnosis not present

## 2019-08-07 DIAGNOSIS — Z85038 Personal history of other malignant neoplasm of large intestine: Secondary | ICD-10-CM | POA: Diagnosis not present

## 2019-08-07 DIAGNOSIS — I10 Essential (primary) hypertension: Secondary | ICD-10-CM | POA: Diagnosis not present

## 2019-08-07 DIAGNOSIS — I2510 Atherosclerotic heart disease of native coronary artery without angina pectoris: Secondary | ICD-10-CM | POA: Diagnosis not present

## 2019-08-07 DIAGNOSIS — N400 Enlarged prostate without lower urinary tract symptoms: Secondary | ICD-10-CM | POA: Diagnosis not present

## 2019-09-28 DIAGNOSIS — R69 Illness, unspecified: Secondary | ICD-10-CM | POA: Diagnosis not present

## 2019-10-08 NOTE — Progress Notes (Signed)
Kevin Mathews was seen today in follow up for Parkinsons disease.  I was contacted by the patient's insurance company about a week after I last saw him to state that they did not think that the patient was taking his medication (levodopa) based on his refill history.  I had just seen him and he had told me he was taking it.  He states today that he is taking it although not necessarily at the same or correct times (last at bedtime).  pt denies falls.  Pt denies lightheadedness, near syncope.  No hallucinations.  Mood has been good.  He is doing RSB, dance class with PD.  Has a spin bike at home and is using it  Current prescribed movement disorder medications: Carbidopa/levodopa 25/100, 1 tablet 3 times per day (taking last one at bedtime)   PREVIOUS MEDICATIONS: Sinemet  ALLERGIES:  No Known Allergies  CURRENT MEDICATIONS:  Outpatient Encounter Medications as of 10/12/2019  Medication Sig  . acetaminophen (TYLENOL) 325 MG tablet Take 650 mg by mouth every 6 (six) hours as needed.  Marland Kitchen aspirin EC 81 MG tablet Take 81 mg by mouth daily.  Marland Kitchen atorvastatin (LIPITOR) 10 MG tablet Take 10 mg by mouth every evening.   . carbidopa-levodopa (SINEMET IR) 25-100 MG tablet Take 1 tablet by mouth 3 (three) times daily.  Marland Kitchen dexlansoprazole (DEXILANT) 60 MG capsule Take 60 mg by mouth daily as needed (acid reflux).  Marland Kitchen doxazosin (CARDURA) 4 MG tablet Take 4 mg by mouth every evening.   . finasteride (PROSCAR) 5 MG tablet Take 5 mg by mouth every evening.   . hydrochlorothiazide (MICROZIDE) 12.5 MG capsule Take 12.5 mg by mouth every evening.   Marland Kitchen ibuprofen (ADVIL,MOTRIN) 200 MG tablet Take 400 mg by mouth 2 (two) times daily as needed for headache or mild pain.   . Multiple Vitamins-Minerals (ADULT GUMMY PO) Take 4 tablets by mouth daily.    No facility-administered encounter medications on file as of 10/12/2019.     PAST MEDICAL HISTORY:   Past Medical History:  Diagnosis Date  . Arthritis   . BPH  (benign prostatic hyperplasia)   . Dyspnea    with excertion  . Frequent urination   . GERD (gastroesophageal reflux disease)    occasional  . History of kidney stones    passed  . Hypertension   . 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 COLONOSCOPY WITH PROPOFOL N/A 04/15/2017   Procedure: COLONOSCOPY WITH PROPOFOL;  Surgeon: Garlan Fair, MD;  Location: [facility];  Service: Endoscopy;  Laterality: N/A;  . LAPAROSCOPIC PARTIAL COLECTOMY N/A 06/12/2017   Procedure: LAPAROSCOPIC ASSISTED PARTIAL COLECTOMY;  Surgeon: Coralie Keens, MD;  Location: [facility];  Service: General;  Laterality: N/A;  . NASAL SINUS SURGERY  1979  . TOTAL SHOULDER ARTHROPLASTY Left 07/01/2013   Procedure: TOTAL SHOULDER ARTHROPLASTY;  Surgeon: Ninetta Lights, MD;  Location: [place];  Service: Orthopedics;  Laterality: Left;  with interscalene block  . TOTAL SHOULDER ARTHROPLASTY Right 03/18/2018   Procedure: RIGHT TOTAL SHOULDER ARTHROPLASTY, BICEPS TENODESIS;  Surgeon: Renette Butters, MD;  Location: [place];  Service: Orthopedics;  Laterality: Right;    SOCIAL HISTORY:   Social History   Socioeconomic History  . Marital status: Married    Spouse name: Not on file  . Number of children: Not on file  . Years of  education: Not on file  . Highest education level: Not on file  Occupational History  . 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 Financial resource strain: Not on file  . Food insecurity    Worry: Not on file    Inability: Not on file  . Transportation needs    Medical: Not on file    Non-medical: Not on file  Tobacco Use  . Smoking status: Former Smoker    Years: 3.00    Quit date: 12/03/1968    Years since quitting: 50.8  . Smokeless tobacco: Never Used  . Tobacco comment: not often  Substance and Sexual Activity  . Alcohol use: No  .  Drug use: No  . Sexual activity: Yes  Lifestyle  . Physical activity    Days per week: Not on file    Minutes per session: Not on file  . Stress: Not on file  Relationships  . Social Herbalist on phone: Not on file    Gets together: Not on file    Attends religious service: Not on file    Active member of club or organization: Not on file    Attends meetings of clubs or organizations: Not on file    Relationship status: Not on file  . Intimate partner violence    Fear of current or ex partner: Not on file    Emotionally abused: Not on file    Physically abused: Not on file    Forced sexual activity: Not on file  Other Topics Concern  . Not on file  Social History Narrative  . Not on file    FAMILY HISTORY:   Family Status  Relation Name Status  . Mother  Deceased  . Father  Deceased  . Brother  Alive  . Daughter  Alive  . Son  Alive    ROS:  Review of Systems  Constitutional: Negative.   HENT: Negative.   Eyes: Negative.   Respiratory: Negative.   Cardiovascular: Negative.   Gastrointestinal: Negative.   Skin: Negative.     PHYSICAL EXAMINATION:    VITALS:   Vitals:   10/12/19 1101  BP: (!) 142/78  Pulse: 69  Resp: 16  Temp: 98.2 F (36.8 C)  SpO2: 97%  Weight: 190 lb (86.2 kg)  Height: 5' 7"  (1.702 m)    GEN:  The patient appears stated age and is in NAD. HEENT:  Normocephalic, atraumatic.  The mucous membranes are moist. The superficial temporal arteries are without ropiness or tenderness. CV:  RRR Lungs:  CTAB Neck/HEME:  There are no carotid bruits bilaterally.  Neurological examination:  Orientation: The patient is alert and oriented x3. Cranial nerves: There is good facial symmetry with facial hypomimia. The speech is fluent and clear. Soft palate rises symmetrically and there is no tongue deviation. Hearing is intact to conversational tone. Sensation: Sensation is intact to light touch throughout Motor: Strength is at least  antigravity x4.  Movement examination: Tone: There is normal tone in the UE/LE Abnormal movements: there is LUE rest tremor and more rare in the R foot Coordination:  There is no decremation with RAM's, with any form of RAMS, including alternating supination and pronation of the forearm, hand opening and closing, finger taps, heel taps and toe taps. Gait and Station: The patient has no difficulty arising out of a deep-seated chair without the use of the hands. The patient's stride length is good.  ASSESSMENT/PLAN:  1.  Parkinson's disease, diagnosed August, 2019             -Continue carbidopa/levodopa 25/100, 1 tablet 3 times per day.  move timing of med to 8am/noon/4pm.    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             -met with my social worker today  -congratulated him on all the exercise he is doing   2.  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  Follow up is anticipated in the next 6 months, sooner should new neurologic issues arise.  Cc:  Lavone Orn, MD

## 2019-10-12 ENCOUNTER — Other Ambulatory Visit: Payer: Self-pay

## 2019-10-12 ENCOUNTER — Ambulatory Visit (INDEPENDENT_AMBULATORY_CARE_PROVIDER_SITE_OTHER): Payer: Medicare HMO | Admitting: Clinical

## 2019-10-12 ENCOUNTER — Ambulatory Visit: Payer: Medicare HMO | Admitting: Neurology

## 2019-10-12 ENCOUNTER — Encounter: Payer: Self-pay | Admitting: Neurology

## 2019-10-12 VITALS — BP 142/78 | HR 69 | Temp 98.2°F | Resp 16 | Ht 67.0 in | Wt 190.0 lb

## 2019-10-12 DIAGNOSIS — Z719 Counseling, unspecified: Secondary | ICD-10-CM | POA: Diagnosis not present

## 2019-10-12 DIAGNOSIS — G20 Parkinson's disease: Secondary | ICD-10-CM | POA: Diagnosis not present

## 2019-10-12 DIAGNOSIS — G20A1 Parkinson's disease without dyskinesia, without mention of fluctuations: Secondary | ICD-10-CM

## 2019-10-12 NOTE — Patient Instructions (Signed)
Take your carbidopa/levodopa 25/100 at 8am/noon/4pm.  The physicians and staff at [facility] are committed to providing excellent care. You may receive a survey requesting feedback about your experience at our office. We strive to receive "very good" responses to the survey questions. If you feel that your experience would prevent you from giving the office a "very good " response, please contact our office to try to remedy the situation. We may be reached at [phone]. Thank you for taking the time out of your busy day to complete the survey.

## 2019-10-16 NOTE — BH Specialist Note (Signed)
Pt presents with h/o Parkinson's Disease. Pt presents today for patient education re management of chronic sx related to PD.  LCSW provided patienteducation re non-motor sx including cognitive and mood components of the disease as well as the value of forced intense exercise to manage sx (in addition to medication regimen).Pt responded receptively to information. Pt also provided with information for PD educational support group, andexercise info for Parkinson's Dance & [employer]. Pt currently attends RSB online and is interested in additional exercise opportunities as they become available. LCSW will remain available for future consultation.   1. Patient to follow up with LCSW:  as needed 2. Medication Recommendation:none 3. Behavioral Recommendation(s): A: Continue RSB online B Consider additional 1-2 forced intense exercise/week to maximize benefit of slowing PD progression

## 2019-12-03 ENCOUNTER — Telehealth: Payer: Self-pay | Admitting: Neurology

## 2019-12-03 ENCOUNTER — Other Ambulatory Visit: Payer: Self-pay | Admitting: Neurology

## 2019-12-03 NOTE — Telephone Encounter (Signed)
This has been sent already to the pharmacy this morning

## 2019-12-03 NOTE — Telephone Encounter (Signed)
Patients wife called in needing a refill for his Carbidopa Levodopa medication called into his Pharmacy on [address]. He will be leaving to go out of town today.  Please Call. Thank you

## 2019-12-08 DIAGNOSIS — L814 Other melanin hyperpigmentation: Secondary | ICD-10-CM | POA: Diagnosis not present

## 2019-12-08 DIAGNOSIS — L821 Other seborrheic keratosis: Secondary | ICD-10-CM | POA: Diagnosis not present

## 2019-12-08 DIAGNOSIS — C4442 Squamous cell carcinoma of skin of scalp and neck: Secondary | ICD-10-CM | POA: Diagnosis not present

## 2019-12-08 DIAGNOSIS — D225 Melanocytic nevi of trunk: Secondary | ICD-10-CM | POA: Diagnosis not present

## 2019-12-08 DIAGNOSIS — D485 Neoplasm of uncertain behavior of skin: Secondary | ICD-10-CM | POA: Diagnosis not present

## 2019-12-08 DIAGNOSIS — L218 Other seborrheic dermatitis: Secondary | ICD-10-CM | POA: Diagnosis not present

## 2019-12-08 DIAGNOSIS — L570 Actinic keratosis: Secondary | ICD-10-CM | POA: Diagnosis not present

## 2019-12-31 DIAGNOSIS — C4442 Squamous cell carcinoma of skin of scalp and neck: Secondary | ICD-10-CM | POA: Diagnosis not present

## 2020-01-04 DIAGNOSIS — R310 Gross hematuria: Secondary | ICD-10-CM | POA: Diagnosis not present

## 2020-01-27 DIAGNOSIS — R310 Gross hematuria: Secondary | ICD-10-CM | POA: Diagnosis not present

## 2020-01-27 DIAGNOSIS — N200 Calculus of kidney: Secondary | ICD-10-CM | POA: Diagnosis not present

## 2020-01-27 DIAGNOSIS — N281 Cyst of kidney, acquired: Secondary | ICD-10-CM | POA: Diagnosis not present

## 2020-01-28 DIAGNOSIS — L905 Scar conditions and fibrosis of skin: Secondary | ICD-10-CM | POA: Diagnosis not present

## 2020-01-28 DIAGNOSIS — Z85828 Personal history of other malignant neoplasm of skin: Secondary | ICD-10-CM | POA: Diagnosis not present

## 2020-01-28 DIAGNOSIS — L218 Other seborrheic dermatitis: Secondary | ICD-10-CM | POA: Diagnosis not present

## 2020-01-28 DIAGNOSIS — L570 Actinic keratosis: Secondary | ICD-10-CM | POA: Diagnosis not present

## 2020-02-01 ENCOUNTER — Other Ambulatory Visit: Payer: Self-pay | Admitting: Urology

## 2020-02-01 DIAGNOSIS — S0100XD Unspecified open wound of scalp, subsequent encounter: Secondary | ICD-10-CM | POA: Diagnosis not present

## 2020-02-01 DIAGNOSIS — R932 Abnormal findings on diagnostic imaging of liver and biliary tract: Secondary | ICD-10-CM

## 2020-02-04 DIAGNOSIS — N400 Enlarged prostate without lower urinary tract symptoms: Secondary | ICD-10-CM | POA: Diagnosis not present

## 2020-02-04 DIAGNOSIS — E7800 Pure hypercholesterolemia, unspecified: Secondary | ICD-10-CM | POA: Diagnosis not present

## 2020-02-04 DIAGNOSIS — Z85820 Personal history of malignant melanoma of skin: Secondary | ICD-10-CM | POA: Diagnosis not present

## 2020-02-04 DIAGNOSIS — I2510 Atherosclerotic heart disease of native coronary artery without angina pectoris: Secondary | ICD-10-CM | POA: Diagnosis not present

## 2020-02-04 DIAGNOSIS — I700 Atherosclerosis of aorta: Secondary | ICD-10-CM | POA: Diagnosis not present

## 2020-02-04 DIAGNOSIS — Z1389 Encounter for screening for other disorder: Secondary | ICD-10-CM | POA: Diagnosis not present

## 2020-02-04 DIAGNOSIS — R319 Hematuria, unspecified: Secondary | ICD-10-CM | POA: Diagnosis not present

## 2020-02-04 DIAGNOSIS — I10 Essential (primary) hypertension: Secondary | ICD-10-CM | POA: Diagnosis not present

## 2020-02-04 DIAGNOSIS — K219 Gastro-esophageal reflux disease without esophagitis: Secondary | ICD-10-CM | POA: Diagnosis not present

## 2020-02-04 DIAGNOSIS — G20 Parkinson's disease: Secondary | ICD-10-CM | POA: Diagnosis not present

## 2020-02-04 DIAGNOSIS — Z85038 Personal history of other malignant neoplasm of large intestine: Secondary | ICD-10-CM | POA: Diagnosis not present

## 2020-02-04 DIAGNOSIS — Z0000 Encounter for general adult medical examination without abnormal findings: Secondary | ICD-10-CM | POA: Diagnosis not present

## 2020-02-11 ENCOUNTER — Encounter: Payer: Self-pay | Admitting: Nurse Practitioner

## 2020-02-11 ENCOUNTER — Other Ambulatory Visit: Payer: Self-pay

## 2020-02-11 ENCOUNTER — Inpatient Hospital Stay: Payer: Medicare HMO | Attending: Nurse Practitioner

## 2020-02-11 ENCOUNTER — Telehealth: Payer: Self-pay | Admitting: *Deleted

## 2020-02-11 ENCOUNTER — Inpatient Hospital Stay (HOSPITAL_BASED_OUTPATIENT_CLINIC_OR_DEPARTMENT_OTHER): Payer: Medicare HMO | Admitting: Nurse Practitioner

## 2020-02-11 VITALS — BP 128/64 | HR 58 | Temp 98.0°F | Resp 17 | Ht 67.0 in | Wt 192.0 lb

## 2020-02-11 DIAGNOSIS — D044 Carcinoma in situ of skin of scalp and neck: Secondary | ICD-10-CM | POA: Insufficient documentation

## 2020-02-11 DIAGNOSIS — C182 Malignant neoplasm of ascending colon: Secondary | ICD-10-CM

## 2020-02-11 DIAGNOSIS — R319 Hematuria, unspecified: Secondary | ICD-10-CM | POA: Insufficient documentation

## 2020-02-11 DIAGNOSIS — Z87891 Personal history of nicotine dependence: Secondary | ICD-10-CM | POA: Insufficient documentation

## 2020-02-11 DIAGNOSIS — G20 Parkinson's disease: Secondary | ICD-10-CM | POA: Diagnosis not present

## 2020-02-11 LAB — CBC WITH DIFFERENTIAL (CANCER CENTER ONLY)
Abs Immature Granulocytes: 0.05 10*3/uL (ref 0.00–0.07)
Basophils Absolute: 0 10*3/uL (ref 0.0–0.1)
Basophils Relative: 1 %
Eosinophils Absolute: 0.3 10*3/uL (ref 0.0–0.5)
Eosinophils Relative: 5 %
HCT: 43.1 % (ref 39.0–52.0)
Hemoglobin: 13.9 g/dL (ref 13.0–17.0)
Immature Granulocytes: 1 %
Lymphocytes Relative: 22 %
Lymphs Abs: 1.2 10*3/uL (ref 0.7–4.0)
MCH: 30.4 pg (ref 26.0–34.0)
MCHC: 32.3 g/dL (ref 30.0–36.0)
MCV: 94.3 fL (ref 80.0–100.0)
Monocytes Absolute: 0.4 10*3/uL (ref 0.1–1.0)
Monocytes Relative: 7 %
Neutro Abs: 3.5 10*3/uL (ref 1.7–7.7)
Neutrophils Relative %: 64 %
Platelet Count: 200 10*3/uL (ref 150–400)
RBC: 4.57 MIL/uL (ref 4.22–5.81)
RDW: 13.8 % (ref 11.5–15.5)
WBC Count: 5.4 10*3/uL (ref 4.0–10.5)
nRBC: 0 % (ref 0.0–0.2)

## 2020-02-11 LAB — CMP (CANCER CENTER ONLY)
ALT: <6 U/L (ref 0–44)
AST: 18 U/L (ref 15–41)
Albumin: 3.9 g/dL (ref 3.5–5.0)
Alkaline Phosphatase: 90 U/L (ref 38–126)
Anion gap: 9 (ref 5–15)
BUN: 21 mg/dL (ref 8–23)
CO2: 26 mmol/L (ref 22–32)
Calcium: 9.4 mg/dL (ref 8.9–10.3)
Chloride: 108 mmol/L (ref 98–111)
Creatinine: 1.1 mg/dL (ref 0.61–1.24)
GFR, Est AFR Am: >60 mL/min (ref >60)
GFR, Estimated: >60 mL/min (ref >60)
Glucose, Bld: 115 mg/dL — ABNORMAL HIGH (ref 70–99)
Potassium: 4.2 mmol/L (ref 3.5–5.1)
Sodium: 143 mmol/L (ref 135–145)
Total Bilirubin: 0.5 mg/dL (ref 0.3–1.2)
Total Protein: 6.9 g/dL (ref 6.5–8.1)

## 2020-02-11 LAB — CEA (IN HOUSE-CHCC): CEA (CHCC-In House): 1.59 ng/mL (ref 0.00–5.00)

## 2020-02-11 NOTE — Telephone Encounter (Signed)
Per Cira Rue NP, called Dr. Winifred Olive office for recent pathology on pt. Left message due to office being closed. Also called Dr. Karsten Ro to have recent CT and last note faxed over to office from [facility] in medical records. Awaiting fax

## 2020-02-11 NOTE — Progress Notes (Signed)
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 Resected and retrieved. - Tumor in the proximal ascending colon. Biopsied. - The examination was otherwise normal.    04/15/2017 Pathology Results   Diagnosis 04/15/17 by Dr. Wynetta Emery  1. Colon, biopsy, at junction of cecum and ascending - INVASIVE ADENOCARCINOMA 2. Colon, polyp(s), descending - BENIGN COLORECTAL MUCOSA. - ASSOCIATED BENIGN LYMPHOID AGGREGATES. - MELANOSIS COLI PRESENT. - NO DYSPLASIA OR MALIGNANCY IDENTIFIED. Microscopic Comment 1. Dr. Lyndon Code has seen the first specimen (cecal and ascending colon biopsy) in consultation with agreement. The findings are called to [facility] who is taking the result for Dr. Wynetta Emery on 04/16/2017.   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 Coronary artery calcifications are noted suggesting coronary artery disease. Scarring is noted in both lungs. 4 mm nodule is noted in left lung  apex. No follow-up needed if patient is low-risk. Non-contrast chest CT can be considered in 12 months if patient is high-risk. This recommendation follows the consensus statement: Guidelines for Management of Incidental Pulmonary Nodules Detected on CT Images: From the Fleischner Society 2017; Radiology 2017; 284:228-243. Small rounded low density seen peripherally in right hepatic lobe which most likely represent cyst, but metastatic disease cannot be excluded given the history of colonic malignancy. Continued follow-up is recommended. 16 mm soft tissue density seen in the cecum which may represent neoplasm or malignancy, as noted on colonoscopy. Aortic atherosclerosis. Stable prostatic enlargement.   06/12/2017 Surgery   LAPAROSCOPIC ASSISTED PARTIAL COLECTOMY by Dr. Ninfa Linden    The [place] port was placed to the opening and insufflation of the abdomen was begun. 69 [name] reports then placed the patient's upper midline under direct vision. I could easily identify the cecum and ascending colon. There was a tattoo where the colonoscopy biopsy was performed indicating the level of the cancer. I saw no other intra-abdominal pathology. The liver appeared normal. I next mobilized the right colon along the white line of [place] free of the appendix is well with harmonic scalpel. I then took down the hepatic flexure freed gallbladder off of the colon with the Harmonic scalpel as well. The entire right colon became easily mobile and was able to easily move past the midline. At this point I converted to the open portion of procedure. I removed all ports. I created an incision between the 2 five-minute reports with scalpel. I then took this down through the fascia with electrocautery. The peritoneum was then opened entirely to the incision. I was then able to easily eviscerate the cecum and right colon and distal ileum. I transected the  distal ileum with the GIA 75 stapler. I then transected the transverse  colon proximally with the GIA 75 staplers well. I then took down the mesentery with the LigaSure cautery device. I easily palpate the mass inside the colon. The specimen was sent to pathology for evaluation. I then reapproximated the small bowel to the proximal colon in a side-to-side fashion with interrupted silk sutures. I performed a colostomy and enterotomy with the cautery and performed a side-to-side anastomosis with a single firing the GIA 75 stapler. The opening was then closed with TA 60 stapler. I evaluated stable on hemostasis appeared to be achieved prior to closing with a TA 60. I then reinforced stapler with interrupted silk sutures and closed the mesenteric defect silk sutures as well. Again the anastomosis appeared pink and well-perfused.  I then placed colon back into the abdomen. I then irrigated the abdomen with several liters normal saline. Hemostasis again appeared to be achieved.   06/12/2017 Pathology Results   Diagnosis 06/12/17 Colon, segmental resection for tumor, right colon - ADENOCARCINOMA, MODERATELY DIFFERENTIATED - NO CARCINOMA IDENTIFIED IN NINETEEN LYMPH NODES (0/19) - MARGINS UNINVOLVED BY CARCINOMA - BENIGN APPENDIX WITH LUMINAL FIBROSIS - SEE ONCOLOGY TABLE BELOW     CURRENT THERAPY: Surveillance   INTERVAL HISTORY: [name] returns for f/u as scheduled. He was last seen 02/12/2019. Appetite is normal. Denies unintentional weight loss. Has periodic constipation which is normal for him. Denies blood in stool. Denies abdominal pain or bloating. He is not sure if he had a colonoscopy after initial surgery. Since last visit, he was diagnosed with melanoma on his scalp and had an episode of bright red hematuria that Dr. Karsten Ro is working up. He has Parkinsons, but remains very active.    MEDICAL HISTORY:  Past Medical History:  Diagnosis Date  . Arthritis   . BPH (benign prostatic hyperplasia)   . Dyspnea    with excertion  . Frequent urination   . GERD  (gastroesophageal reflux disease)    occasional  . History of kidney stones    passed  . Hypertension   . 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 COLONOSCOPY WITH PROPOFOL N/A 04/15/2017   Procedure: COLONOSCOPY WITH PROPOFOL;  Surgeon: Garlan Fair, MD;  Location: [facility];  Service: Endoscopy;  Laterality: N/A;  . LAPAROSCOPIC PARTIAL COLECTOMY N/A 06/12/2017   Procedure: LAPAROSCOPIC ASSISTED PARTIAL COLECTOMY;  Surgeon: Coralie Keens, MD;  Location: [facility];  Service: General;  Laterality: N/A;  . NASAL SINUS SURGERY  1979  . TOTAL SHOULDER ARTHROPLASTY Left 07/01/2013   Procedure: TOTAL SHOULDER ARTHROPLASTY;  Surgeon: Ninetta Lights, MD;  Location: [place];  Service: Orthopedics;  Laterality: Left;  with interscalene block  . TOTAL SHOULDER ARTHROPLASTY Right 03/18/2018   Procedure: RIGHT TOTAL SHOULDER ARTHROPLASTY, BICEPS TENODESIS;  Surgeon: Renette Butters, MD;  Location: [place];  Service: Orthopedics;  Laterality: Right;    I have reviewed the social history and family history with the patient and they are unchanged from previous note.  ALLERGIES:  has No Known Allergies.  MEDICATIONS:  Current Outpatient Medications  Medication Sig Dispense Refill  . acetaminophen (TYLENOL) 325 MG tablet Take 650 mg by mouth every 6 (six) hours as needed.    Marland Kitchen aspirin EC 81 MG tablet Take 81 mg by mouth daily.    Marland Kitchen atorvastatin (LIPITOR) 10 MG tablet Take 10 mg by mouth every  evening.     . carbidopa-levodopa (SINEMET IR) 25-100 MG tablet TAKE 1 TABLET BY MOUTH THREE TIMES DAILY 270 tablet 0  . dexlansoprazole (DEXILANT) 60 MG capsule Take 60 mg by mouth daily as needed (acid reflux).    Marland Kitchen doxazosin (CARDURA) 4 MG tablet Take 4 mg by mouth every evening.     . finasteride (PROSCAR) 5 MG tablet Take 5 mg by mouth every evening.     . hydrochlorothiazide (MICROZIDE) 12.5  MG capsule Take 12.5 mg by mouth every evening.     Marland Kitchen ibuprofen (ADVIL,MOTRIN) 200 MG tablet Take 400 mg by mouth 2 (two) times daily as needed for headache or mild pain.     . Multiple Vitamins-Minerals (ADULT GUMMY PO) Take 4 tablets by mouth daily.      No current facility-administered medications for this visit.    PHYSICAL EXAMINATION:  Vitals:   02/11/20 0932 02/11/20 0933  BP: (!) 112/101 128/64  Pulse: (!) 58   Resp: 17   Temp: 98 F (36.7 C)   SpO2: 100%    Filed Weights   02/11/20 0932  Weight: 192 lb (87.1 kg)    GENERAL:alert, no distress and comfortable SKIN: no rash. Scalp excision covered with bandaid  EYES: sclera clear NECK: without mass LUNGS: clear with normal breathing effort HEART: regular rate & rhythm, no lower extremity edema ABDOMEN:abdomen soft, non-tender and normal bowel sounds. No hepatomegaly  NEURO: alert & oriented x 3 with fluent speech, resting tremor  LABORATORY DATA:  I have reviewed the data as listed CBC Latest Ref Rng & Units 02/11/2020 02/12/2019 08/14/2018  WBC 4.0 - 10.5 K/uL 5.4 5.8 5.8  Hemoglobin 13.0 - 17.0 g/dL 13.9 14.0 13.6  Hematocrit 39.0 - 52.0 % 43.1 43.5 41.9  Platelets 150 - 400 K/uL 200 188 187     CMP Latest Ref Rng & Units 02/11/2020 02/12/2019 08/14/2018  Glucose 70 - 99 mg/dL 115(H) 96 96  BUN 8 - 23 mg/dL 21 22 20   Creatinine 0.61 - 1.24 mg/dL 1.10 0.96 0.96  Sodium 135 - 145 mmol/L 143 142 141  Potassium 3.5 - 5.1 mmol/L 4.2 4.1 4.4  Chloride 98 - 111 mmol/L 108 109 107  CO2 22 - 32 mmol/L 26 25 26   Calcium 8.9 - 10.3 mg/dL 9.4 9.2 9.6  Total Protein 6.5 - 8.1 g/dL 6.9 7.2 7.1  Total Bilirubin 0.3 - 1.2 mg/dL 0.5 0.7 0.6  Alkaline Phos 38 - 126 U/L 90 97 107  AST 15 - 41 U/L 18 16 19   ALT 0 - 44 U/L <6 <6 20      RADIOGRAPHIC STUDIES: I have personally reviewed the radiological images as listed and agreed with the findings in the report. No results found.   ASSESSMENT & PLAN: CASSIAN TORELLI is a  79 y.o. male   1. Malignant neoplasm of ascending colon, pT2N0M0, stage I, G2, MMR abnormal, MSI-H  -he was diagnosed in 2018,s/p  partial ileocolectomy on 06/12/2017.  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 No clinically significant variants were detected in the KRAS, NRAS, or BRAF genes. MLH1 hypermethylation is present, suggesting somatic mutation and therefore not lynch syndrome -I have requested records from Dr. Paulita Fujita to see if he is up to date on colonoscopy  -From a colon cancer standpoint, Mr. Smyers is clinically doing well. Labs are unremarkable, CEA normal, exam is benign. No clinical concern for recurrence.  -  Continue surveillance, healthy diet, exercise -F/u annually   2. Hematuria  -work up pending per Dr. Karsten Ro; I have requested records on this   3. Melanoma, scalp -S/p excision, healing; I have requested path report   4. 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  -No clinically significant variants were detected in the KRAS, NRAS, or BRAF genes.  -MLH1 hypermethylation is present, suggesting somatic mutation and therefore not lynch syndrome. I discussed with Dr. Burr Medico -Given the above and his personal history, as well as new diagnosis of melanoma, I recommend for him to see our genetic counselor for more in depth discussion today. He agrees and is interested, I referred him  5. Low density lesion in liver and lung nodule, likely benign  -Case previously discussed with Dr. Nyoka Cowden, Radiologist, who compared 04/2017 CT C/A/P with 2016 CT abdomen and MRI abdomen. He confirms the area in question is the same size and location as previously seen in 2016 and has not changed; confirms this is a cyst and does not require further testing.   -4 mm left lung apex nodule on 04/25/17 CT Scan -He is low risk for lung cancer based  on light smoking history   6. Parkinson disease  -f/u with Tat  -stable, remains very active   PLAN: -Labs reviewed -Continue colon cancer surveillance  -Requested records from Dr. Paulita Fujita on patient's last colonoscopy -Referral to genetics  -Scalp, requested path (melanoma) -Continue work up for hematuria per Dr. Karsten Ro -Surveillance f/u annually, or sooner if needed if above work up is abnormal  No problem-specific Assessment & Plan notes found for this encounter.   Orders Placed This Encounter  Procedures  . Ambulatory referral to Genetics    Referral Priority:   Routine    Referral Type:   Consultation    Referral Reason:   Specialty Services Required    Number of Visits Requested:   1   All questions were answered. The patient knows to call the clinic with any problems, questions or concerns. No barriers to learning was detected. I spent 25 minutes counseling the patient face to face. The total time spent in the appointment was 35 minutes and more than 50% was on counseling, review of test results, and coordination of care.      Alla Feeling, NP 02/11/20

## 2020-02-12 ENCOUNTER — Telehealth: Payer: Self-pay | Admitting: Nurse Practitioner

## 2020-02-12 NOTE — Telephone Encounter (Signed)
Scheduled appt per 3/11 los.  Left a vm of the appt date and time

## 2020-02-15 ENCOUNTER — Telehealth: Payer: Self-pay | Admitting: Hematology

## 2020-02-15 NOTE — Telephone Encounter (Signed)
Called and rescheduled 02/10/21 appt to 02/14/21 per patient request. Confirmed new date and time with patients wife

## 2020-02-16 ENCOUNTER — Encounter: Payer: Self-pay | Admitting: Nurse Practitioner

## 2020-02-16 ENCOUNTER — Other Ambulatory Visit: Payer: Self-pay | Admitting: Licensed Clinical Social Worker

## 2020-02-16 DIAGNOSIS — C182 Malignant neoplasm of ascending colon: Secondary | ICD-10-CM

## 2020-02-16 NOTE — Progress Notes (Signed)
I received and reviewed outside records including CT report from 2/24 to evaluate gross hematuria. Due to abnormal CT he has been referred for MR ABD/MRCP per Dr. Karsten Ro, scheduled 02/23/20. 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 I have updated our genetics counselor. Will follow his work up.   Cira Rue, NP

## 2020-02-16 NOTE — Progress Notes (Signed)
gen

## 2020-02-18 ENCOUNTER — Inpatient Hospital Stay: Payer: Medicare HMO

## 2020-02-18 ENCOUNTER — Other Ambulatory Visit: Payer: Self-pay

## 2020-02-18 ENCOUNTER — Inpatient Hospital Stay (HOSPITAL_BASED_OUTPATIENT_CLINIC_OR_DEPARTMENT_OTHER): Payer: Medicare HMO | Admitting: Licensed Clinical Social Worker

## 2020-02-18 ENCOUNTER — Encounter: Payer: Self-pay | Admitting: Licensed Clinical Social Worker

## 2020-02-18 DIAGNOSIS — C182 Malignant neoplasm of ascending colon: Secondary | ICD-10-CM

## 2020-02-18 DIAGNOSIS — Z85038 Personal history of other malignant neoplasm of large intestine: Secondary | ICD-10-CM

## 2020-02-18 DIAGNOSIS — Z87891 Personal history of nicotine dependence: Secondary | ICD-10-CM | POA: Diagnosis not present

## 2020-02-18 DIAGNOSIS — Z85828 Personal history of other malignant neoplasm of skin: Secondary | ICD-10-CM | POA: Diagnosis not present

## 2020-02-18 NOTE — Progress Notes (Signed)
REFERRING PROVIDER: Alla Feeling, NP [place],  [place] [address]  PRIMARY PROVIDER:  Lavone Orn, MD  PRIMARY REASON FOR VISIT:  1. 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 History of skin cancer      HISTORY OF PRESENT ILLNESS:   Kevin Mathews, a 79 y.o. male, was seen for a  cancer genetics consultation at the request of Cira Rue, NP due to personal history of cancer.  Kevin Mathews presents to clinic today to discuss the possibility of a hereditary predisposition to cancer, genetic testing, and to further clarify his future cancer risks, as well as potential cancer risks for family members.   In 2018, at the age of 27, Kevin Mathews was diagnosed with colon cancer. This was treated with partial ileocolectomy on 06/12/2017. Tumor showed loss of MLH1/PMS2, MSI-H, and MLH1 hypermethylation was present suggesting sporadic cancer. He reports having only 1 colon polyp on cscope.   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 Resected and retrieved. - Tumor in the proximal ascending colon. Biopsied. - The examination was otherwise normal.    04/15/2017 Pathology Results   Diagnosis 04/15/17 by Dr. Wynetta Emery  1. Colon, biopsy, at junction of cecum and ascending - INVASIVE ADENOCARCINOMA 2. Colon, polyp(s), descending - BENIGN COLORECTAL  MUCOSA. - ASSOCIATED BENIGN LYMPHOID AGGREGATES. - MELANOSIS COLI PRESENT. - NO DYSPLASIA OR MALIGNANCY IDENTIFIED. Microscopic Comment 1. Dr. Lyndon Code has seen the first specimen (cecal and ascending colon biopsy) in consultation with agreement. The findings are called to [facility] who is taking the result for Dr. Wynetta Emery on 04/16/2017.   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 Coronary artery calcifications are noted suggesting coronary artery disease. Scarring is noted in both lungs. 4 mm nodule is noted in left lung apex. No follow-up needed if patient is low-risk. Non-contrast chest CT can be considered in 12 months if patient is high-risk. This recommendation follows the consensus statement: Guidelines for Management of Incidental Pulmonary Nodules Detected on CT Images: From the Fleischner Society 2017; Radiology 2017; 284:228-243. Small rounded low density seen peripherally in right hepatic lobe which most likely represent cyst, but metastatic disease cannot be excluded given the history of colonic malignancy. Continued follow-up is recommended. 16 mm soft tissue density seen in the cecum which may represent neoplasm or malignancy, as noted on colonoscopy. Aortic atherosclerosis. Stable prostatic enlargement.   06/12/2017 Surgery   LAPAROSCOPIC ASSISTED PARTIAL COLECTOMY by Dr. Ninfa Linden    The [place] port was placed to the opening and insufflation of the abdomen was begun. 92 [name] reports then placed the patient's upper midline under direct vision. I could easily identify the cecum and ascending colon. There was a tattoo where the colonoscopy biopsy was performed indicating the level of the cancer. I  saw no other intra-abdominal pathology. The liver appeared normal. I next mobilized the right colon along the white line of  [place] free of the appendix is well with harmonic scalpel. I then took down the hepatic flexure freed gallbladder off of the colon with the Harmonic scalpel as well. The entire right colon became easily mobile and was able to easily move past the midline. At this point I converted to the open portion of procedure. I removed all ports. I created an incision between the 2 five-minute reports with scalpel. I then took this down through the fascia with electrocautery. The peritoneum was then opened entirely to the incision. I was then able to easily eviscerate the cecum and right colon and distal ileum. I transected the distal ileum with the GIA 75 stapler. I then transected the transverse colon proximally with the GIA 75 staplers well. I then took down the mesentery with the LigaSure cautery device. I easily palpate the mass inside the colon. The specimen was sent to pathology for evaluation. I then reapproximated the small bowel to the proximal colon in a side-to-side fashion with interrupted silk sutures. I performed a colostomy and enterotomy with the cautery and performed a side-to-side anastomosis with a single firing the GIA 75 stapler. The opening was then closed with TA 60 stapler. I evaluated stable on hemostasis appeared to be achieved prior to closing with a TA 60. I then reinforced stapler with interrupted silk sutures and closed the mesenteric defect silk sutures as well. Again the anastomosis appeared pink and well-perfused.  I then placed colon back into the abdomen. I then irrigated the abdomen with several liters normal saline. Hemostasis again appeared to be achieved.   06/12/2017 Pathology Results   Diagnosis 06/12/17 Colon, segmental resection for tumor, right colon - ADENOCARCINOMA, MODERATELY DIFFERENTIATED - NO CARCINOMA IDENTIFIED IN NINETEEN LYMPH NODES (0/19) - MARGINS UNINVOLVED BY CARCINOMA - BENIGN APPENDIX WITH LUMINAL FIBROSIS - SEE ONCOLOGY TABLE BELOW     Past Medical  History:  Diagnosis Date  . Arthritis   . BPH (benign prostatic hyperplasia)   . Dyspnea    with excertion  . Frequent urination   . GERD (gastroesophageal reflux disease)    occasional  . History of kidney stones    passed  . Hypertension   . 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 COLONOSCOPY WITH PROPOFOL N/A 04/15/2017   Procedure: COLONOSCOPY WITH PROPOFOL;  Surgeon: Garlan Fair, MD;  Location: [facility];  Service: Endoscopy;  Laterality: N/A;  . LAPAROSCOPIC PARTIAL COLECTOMY N/A 06/12/2017   Procedure: LAPAROSCOPIC ASSISTED PARTIAL COLECTOMY;  Surgeon: Coralie Keens, MD;  Location: [facility];  Service: General;  Laterality: N/A;  . NASAL SINUS SURGERY  1979  . TOTAL SHOULDER ARTHROPLASTY Left 07/01/2013   Procedure: TOTAL SHOULDER ARTHROPLASTY;  Surgeon: Ninetta Lights, MD;  Location: [facility];  Service: Orthopedics;  Laterality: Left;  with interscalene block  . TOTAL SHOULDER ARTHROPLASTY Right 03/18/2018   Procedure: RIGHT TOTAL SHOULDER ARTHROPLASTY, BICEPS TENODESIS;  Surgeon: Renette Butters, MD;  Location: [facility];  Service: Orthopedics;  Laterality: Right;    Social History   Socioeconomic History  . Marital status: Married    Spouse name: Not on file  . Number of children: Not on file  . Years of education: Not on file  . Highest education level: Not on file  Occupational History  . Occupation: retired  Comment: Government social research officer   Tobacco Use  . Smoking status: Former Smoker    Years: 3.00    Quit date: 12/03/1968    Years since quitting: 51.2  . Smokeless tobacco: Never Used  . Tobacco comment: not often  Substance and Sexual Activity  . Alcohol use: No  . Drug use: No  . Sexual activity: Yes  Other Topics Concern  . Not on file  Social History Narrative  . Not on file   Social Determinants of Health   Financial Resource Strain:   . Difficulty of  Paying Living Expenses:   Food Insecurity:   . Worried About Charity fundraiser in the Last Year:   . Arboriculturist in the Last Year:   Transportation Needs:   . Film/video editor (Medical):   Marland Kitchen Lack of Transportation (Non-Medical):   Physical Activity:   . Days of Exercise per Week:   . Minutes of Exercise per Session:   Stress:   . Feeling of Stress :   Social Connections:   . Frequency of Communication with Friends and Family:   . Frequency of Social Gatherings with Friends and Family:   . Attends Religious Services:   . Active Member of Clubs or Organizations:   . Attends Archivist Meetings:   Marland Kitchen Marital Status:      FAMILY HISTORY:  We obtained a detailed, 4-generation family history.  Significant diagnoses are listed below: Family History  Problem Relation Age of Onset  . Hypertension Father   . Healthy Daughter   . Healthy Son    Kevin Mathews has a daughter, Colletta [place], age 72 and a son age 20, neither have had colonoscopy. Kevin Mathews has one brother living at 39 with no history of cancer.  Kevin Mathews mother died at 75, no cancer. Patient had 4 maternal aunts and 3 maternal uncles, no cancers. No known cancers in maternal cousins. Patient unsure age of death for maternal grandparents.  Kevin Mathews father died at 79, no cancer. Patient had 4 paternal aunts and 4 paternal uncles, no cancers for them or for his paternal cousins. Paternal grandfather died at 18, unsure age of death for grandmother.   Kevin Mathews is unaware of previous family history of genetic testing for hereditary cancer risks. Patient's maternal ancestors are of [place] descent, and paternal ancestors are of [place] descent. There is no reported Ashkenazi Jewish ancestry. There is no known consanguinity.  GENETIC COUNSELING ASSESSMENT: Kevin Mathews is a 79 y.o. male with a personal which is not particularly suggestive of a hereditary cancer syndrome and predisposition to cancer. We,  therefore, discussed and recommended the following at today's visit.   DISCUSSION: We discussed that 5-7% of colon cancer is hereditary, with most cases associated with Lynch syndrome. Based on his tumor testing on his colon cancer, it is unlikely he has Lynch syndrome. There are other genes that can be associated with hereditary colon cancer syndromes. We discussed that testing is beneficial for several reasons including knowing how to follow individuals after completing their treatment, and understand if other family members could be at risk for cancer and allow them to undergo genetic testing.   We discussed with Kevin Mathews that the personal and family history does not meet insurance or NCCN criteria for genetic testing and, therefore, is not highly consistent with a familial hereditary cancer syndrome.  We feel he is at low risk to harbor a gene mutation associated with such a  condition. Thus, we did not recommend any genetic testing at this time and recommended Kevin Mathews continue to follow the cancer screening guidelines given by his primary healthcare provider. We offered for him to pursue testing if he still wanted it after hearing about it, but he declined this. Patient's wife is interested in testing for her daughter and was given a brochure for testing. We discussed [facility]'s proactive genetic testing than can be done as DTC. Patient will also further investigate family history and call to update [place] if any other cancers in the family.  PLAN: Kevin Mathews did not wish to pursue genetic testing at today's visit. We understand this decision and remain available to coordinate genetic testing at any time in the future. We, therefore, recommend Kevin Mathews continue to follow the cancer screening guidelines given by his primary healthcare provider.  Kevin Mathews questions were answered to his satisfaction today. Our contact information was provided should additional questions or concerns arise. Thank you  for the referral and allowing [place] to share in the care of your patient.   Faith Rogue, MS, [facility] Genetic Counselor [email or web address] Phone: [phone]  The patient was seen for a total of 35 minutes in face-to-face genetic counseling.  Patient's wife was also present. Drs. Magrinat, Lindi Adie and/or Burr Medico were available for discussion regarding this case.   _______________________________________________________________________ For Office Staff:  Number of people involved in session: 2 Was an Intern/ student involved with case: no

## 2020-02-23 ENCOUNTER — Other Ambulatory Visit: Payer: Self-pay

## 2020-02-23 ENCOUNTER — Ambulatory Visit
Admission: RE | Admit: 2020-02-23 | Discharge: 2020-02-23 | Disposition: A | Discharge status: Home / Self Care | Payer: Medicare HMO | Source: Ambulatory Visit | Attending: Urology | Admitting: Urology

## 2020-02-23 DIAGNOSIS — N281 Cyst of kidney, acquired: Secondary | ICD-10-CM | POA: Diagnosis not present

## 2020-02-23 DIAGNOSIS — R932 Abnormal findings on diagnostic imaging of liver and biliary tract: Secondary | ICD-10-CM

## 2020-02-23 MED ORDER — GADOBENATE DIMEGLUMINE 529 MG/ML IV SOLN
17.0000 mL | Freq: Once | INTRAVENOUS | Status: AC | PRN
  Administered 2020-02-23: 17 mL via INTRAVENOUS

## 2020-02-29 ENCOUNTER — Other Ambulatory Visit: Payer: Self-pay | Admitting: Neurology

## 2020-03-22 DIAGNOSIS — H903 Sensorineural hearing loss, bilateral: Secondary | ICD-10-CM | POA: Diagnosis not present

## 2020-04-06 DIAGNOSIS — H903 Sensorineural hearing loss, bilateral: Secondary | ICD-10-CM | POA: Diagnosis not present

## 2020-04-08 NOTE — Progress Notes (Signed)
Assessment/Plan:   1.  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  Again discussed that needs to move dosages closer together  -increase exercise  -asks about alternative med just for tremor.  Didn't recommend d/t risks>benefits  2.  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  Needs to make f/u appt  3.  Scalp skin cancer.  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  -Patient is following with dermatology as well as oncology for his history of colon cancer.  -Did discuss with the patient that Parkinson's disease does increase risk for melanoma slightly, and he should continue to follow with dermatology.   Subjective:   Kevin Mathews was seen today in follow up for Parkinsons disease.  my previous records were reviewed prior to todays visit as well as outside records available to me.   This patient is accompanied in the office by his spouse who supplements the history.   Discussed last visit moving his last dosage of levodopa earlier in the day.  He reports today that he is still taking the last at bedtime (missing middle of the day dosage and taking the 2nd at 5pm).  pt denies falls.  Pt denies lightheadedness, near syncope.  No hallucinations.  Mood has been good.  Doing RSB online.  Last saw oncology for his history of colon cancer on March 11.  He is doing well from that regard.  He had a skin cancer removed since last visit and while the patient thought it was a melanoma, path report said invasive squamous cell carcinoma.  Current prescribed movement disorder medications: Carbidopa/levodopa 25/100, 1 tablet 3 times per day   PREVIOUS MEDICATIONS: Sinemet  ALLERGIES:  No Known Allergies  CURRENT MEDICATIONS:  Outpatient Encounter Medications as of 04/11/2020  Medication Sig  . acetaminophen (TYLENOL) 325 MG tablet Take 650 mg by mouth every 6 (six)  hours as needed.  Marland Kitchen aspirin EC 81 MG tablet Take 81 mg by mouth 3 (three) times a week.   Marland Kitchen atorvastatin (LIPITOR) 10 MG tablet Take 10 mg by mouth 4 (four) times a week.   Marland Kitchen b complex vitamins capsule Take 1 capsule by mouth daily.  . carbidopa-levodopa (SINEMET IR) 25-100 MG tablet TAKE 1 TABLET BY MOUTH THREE TIMES DAILY  . Cholecalciferol (CVS VITAMIN D3 PO) Take 1 tablet by mouth daily.  Marland Kitchen dexlansoprazole (DEXILANT) 60 MG capsule Take 60 mg by mouth daily as needed (acid reflux).  Marland Kitchen doxazosin (CARDURA) 4 MG tablet Take 4 mg by mouth every evening.   . finasteride (PROSCAR) 5 MG tablet Take 5 mg by mouth every evening.   . hydrochlorothiazide (MICROZIDE) 12.5 MG capsule Take 12.5 mg by mouth every evening.   Marland Kitchen ibuprofen (ADVIL,MOTRIN) 200 MG tablet Take 400 mg by mouth 2 (two) times daily as needed for headache or mild pain.   . Multiple Vitamins-Minerals (ADULT GUMMY PO) Take 2 tablets by mouth daily.    No facility-administered encounter medications on file as of 04/11/2020.    Objective:   PHYSICAL EXAMINATION:    VITALS:   Vitals:   04/11/20 1123  BP: 135/86  Pulse: 60  SpO2: 97%  Weight: 190 lb (86.2 kg)  Height: 5\' 7"  (1.702 m)    GEN:  The patient appears stated age and is in NAD. HEENT:  Normocephalic, atraumatic.  The mucous membranes are moist. The superficial temporal arteries  are without ropiness or tenderness. CV:  RRR Lungs:  CTAB Neck/HEME:  There are no carotid bruits bilaterally.  Neurological examination:  Orientation: The patient is alert and oriented x3. Cranial nerves: There is good facial symmetry with facial hypomimia. The speech is fluent and clear. Soft palate rises symmetrically and there is no tongue deviation. Hearing is intact to conversational tone. Sensation: Sensation is intact to light touch throughout Motor: Strength is at least antigravity x4.  Movement examination: Tone: There is mild increased tone in the LUE Abnormal movements:  there is LUE rest tremor Coordination:  There is no decremation with RAM's Gait and Station: The patient has no difficulty arising out of a deep-seated chair without the use of the hands. The patient's stride length is good.    I have reviewed and interpreted the following labs independently    Chemistry      Component Value Date/Time   NA 143 02/11/2020 0915   K 4.2 02/11/2020 0915   CL 108 02/11/2020 0915   CO2 26 02/11/2020 0915   BUN 21 02/11/2020 0915   CREATININE 1.10 02/11/2020 0915      Component Value Date/Time   CALCIUM 9.4 02/11/2020 0915   ALKPHOS 90 02/11/2020 0915   AST 18 02/11/2020 0915   ALT <6 02/11/2020 0915   BILITOT 0.5 02/11/2020 0915       Lab Results  Component Value Date   WBC 5.4 02/11/2020   HGB 13.9 02/11/2020   HCT 43.1 02/11/2020   MCV 94.3 02/11/2020   PLT 200 02/11/2020    Lab Results  Component Value Date   TSH 3.74 07/22/2018     Total time spent on today's visit was 30 minutes, including both face-to-face time and nonface-to-face time.  Time included that spent on review of records (prior notes available to me/labs/imaging if pertinent), discussing treatment and goals, answering patient's questions and coordinating care.  Cc:  Lavone Orn, MD

## 2020-04-11 ENCOUNTER — Other Ambulatory Visit: Payer: Self-pay

## 2020-04-11 ENCOUNTER — Ambulatory Visit: Payer: Medicare HMO | Admitting: Neurology

## 2020-04-11 ENCOUNTER — Encounter: Payer: Self-pay | Admitting: Neurology

## 2020-04-11 VITALS — BP 135/86 | HR 60 | Ht 67.0 in | Wt 190.0 lb

## 2020-04-11 DIAGNOSIS — G20 Parkinson's disease: Secondary | ICD-10-CM

## 2020-04-11 NOTE — Patient Instructions (Signed)
1.  Continue carbidopa/levodopa 25/100, one tablet at 8am/noon/4pm 2.  Exercise!  The physicians and staff at [facility] are committed to providing excellent care. You may receive a survey requesting feedback about your experience at our office. We strive to receive "very good" responses to the survey questions. If you feel that your experience would prevent you from giving the office a "very good " response, please contact our office to try to remedy the situation. We may be reached at [phone]. Thank you for taking the time out of your busy day to complete the survey.

## 2020-05-28 ENCOUNTER — Other Ambulatory Visit: Payer: Self-pay | Admitting: Neurology

## 2020-07-08 DIAGNOSIS — E7800 Pure hypercholesterolemia, unspecified: Secondary | ICD-10-CM | POA: Diagnosis not present

## 2020-07-08 DIAGNOSIS — I10 Essential (primary) hypertension: Secondary | ICD-10-CM | POA: Diagnosis not present

## 2020-07-08 DIAGNOSIS — G20 Parkinson's disease: Secondary | ICD-10-CM | POA: Diagnosis not present

## 2020-07-08 DIAGNOSIS — I2510 Atherosclerotic heart disease of native coronary artery without angina pectoris: Secondary | ICD-10-CM | POA: Diagnosis not present

## 2020-07-08 DIAGNOSIS — N400 Enlarged prostate without lower urinary tract symptoms: Secondary | ICD-10-CM | POA: Diagnosis not present

## 2020-07-08 DIAGNOSIS — R0989 Other specified symptoms and signs involving the circulatory and respiratory systems: Secondary | ICD-10-CM | POA: Diagnosis not present

## 2020-07-08 DIAGNOSIS — I700 Atherosclerosis of aorta: Secondary | ICD-10-CM | POA: Diagnosis not present

## 2020-07-28 DIAGNOSIS — L905 Scar conditions and fibrosis of skin: Secondary | ICD-10-CM | POA: Diagnosis not present

## 2020-07-28 DIAGNOSIS — D1801 Hemangioma of skin and subcutaneous tissue: Secondary | ICD-10-CM | POA: Diagnosis not present

## 2020-07-28 DIAGNOSIS — Z85828 Personal history of other malignant neoplasm of skin: Secondary | ICD-10-CM | POA: Diagnosis not present

## 2020-07-28 DIAGNOSIS — L821 Other seborrheic keratosis: Secondary | ICD-10-CM | POA: Diagnosis not present

## 2020-07-28 DIAGNOSIS — D225 Melanocytic nevi of trunk: Secondary | ICD-10-CM | POA: Diagnosis not present

## 2020-07-28 DIAGNOSIS — L814 Other melanin hyperpigmentation: Secondary | ICD-10-CM | POA: Diagnosis not present

## 2020-07-28 DIAGNOSIS — L570 Actinic keratosis: Secondary | ICD-10-CM | POA: Diagnosis not present

## 2020-08-17 DIAGNOSIS — R310 Gross hematuria: Secondary | ICD-10-CM | POA: Diagnosis not present

## 2020-08-17 DIAGNOSIS — R350 Frequency of micturition: Secondary | ICD-10-CM | POA: Diagnosis not present

## 2020-08-17 DIAGNOSIS — N401 Enlarged prostate with lower urinary tract symptoms: Secondary | ICD-10-CM | POA: Diagnosis not present

## 2020-09-20 NOTE — Progress Notes (Signed)
Assessment/Plan:   1.  Parkinsons Disease  -Continue carbidopa/levodopa 25/100, 1 tablet 3 times daily.  Discussed again about importance of taking tid and having regular schedule.   -they ask about a "doctor" in [place] who wants thousands of dollars for supplements and wants him to go off of his carbidopa/levodopa 25/100.  Told him that this is a scam.  -exercise - he is doing this and proud of him for doing this.  2.  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 he is to get regular skin checks.  4.  History of colon cancer  -Follows with oncology   Subjective:   Kevin Mathews was seen today in follow up for Parkinsons disease.  Pt with wife who supplements hx. My previous records were reviewed prior to todays visit as well as outside records available to me. Has trouble taking medication regularly but is working on that.  Pt denies falls.  Pt denies lightheadedness, near syncope.  No hallucinations.  Mood has been fair.  Doing RSB and dance for exercise.  Occasional rides spin bike.  Current prescribed movement disorder medications: Carbidopa/levodopa 25/100, 1 tablet 3 times per day   ALLERGIES:  No Known Allergies  CURRENT MEDICATIONS:  Outpatient Encounter Medications as of 09/23/2020  Medication Sig  . acetaminophen (TYLENOL) 325 MG tablet Take 650 mg by mouth every 6 (six) hours as needed.  Marland Kitchen aspirin EC 81 MG tablet Take 81 mg by mouth 3 (three) times a week.   Marland Kitchen atorvastatin (LIPITOR) 10 MG tablet Take 10 mg by mouth 4 (four) times a week.   Marland Kitchen b complex vitamins capsule Take 1 capsule by mouth daily.  . carbidopa-levodopa (SINEMET IR) 25-100 MG tablet TAKE 1 TABLET  BY MOUTH THREE TIMES DAILY (Patient taking differently: Take 1 tablet by mouth 3 (three) times daily. 1 tab 8:30am/ 1 at 1:30pm and 1 tab at 7pm)  . Cholecalciferol (CVS VITAMIN D3 PO) Take 1 tablet by mouth daily.  Marland Kitchen dexlansoprazole (DEXILANT) 60 MG capsule Take 60 mg by mouth daily as needed (acid reflux).  Marland Kitchen doxazosin (CARDURA) 4 MG tablet Take 4 mg by mouth every evening.   . finasteride (PROSCAR) 5 MG tablet Take 5 mg by mouth every evening.   . hydrochlorothiazide (MICROZIDE) 12.5 MG capsule Take 12.5 mg by mouth every evening.   Marland Kitchen ibuprofen (ADVIL,MOTRIN) 200 MG tablet Take 400 mg by mouth 2 (two) times daily as needed for headache or mild pain.   . Multiple Vitamins-Minerals (ADULT GUMMY PO) Take 2 tablets by mouth daily.    No facility-administered encounter medications on file as of 09/23/2020.    Objective:   PHYSICAL EXAMINATION:    VITALS:   Vitals:   09/23/20 1303  BP: 117/62  Pulse: 81  SpO2: 95%  Weight: 191 lb (86.6 kg)  Height: 5\' 7"  (1.702 m)    GEN:  The patient appears stated age and is in NAD. HEENT:  Normocephalic, atraumatic.  The mucous membranes are moist. The superficial temporal arteries are without ropiness or tenderness. CV:  RRR Lungs:  CTAB Neck/HEME:  There are no carotid bruits bilaterally.  Neurological examination:  Orientation: The patient  is alert and oriented x3. Cranial nerves: There is good facial symmetry with facial hypomimia. The speech is fluent and clear. Soft palate rises symmetrically and there is no tongue deviation. Hearing is intact to conversational tone. Sensation: Sensation is intact to light touch throughout Motor: Strength is at least antigravity x4.  Movement examination: Tone: There is nl tone in the UE/LE Abnormal movements: there is LUE rest tremor Coordination:  There is no decremation with RAM's Gait and Station: The patient has no difficulty arising out of a deep-seated chair without the use of the hands. The  patient's stride length is good with LUE rest tremor.     I have reviewed and interpreted the following labs independently    Chemistry      Component Value Date/Time   NA 143 02/11/2020 0915   K 4.2 02/11/2020 0915   CL 108 02/11/2020 0915   CO2 26 02/11/2020 0915   BUN 21 02/11/2020 0915   CREATININE 1.10 02/11/2020 0915      Component Value Date/Time   CALCIUM 9.4 02/11/2020 0915   ALKPHOS 90 02/11/2020 0915   AST 18 02/11/2020 0915   ALT <6 02/11/2020 0915   BILITOT 0.5 02/11/2020 0915       Lab Results  Component Value Date   WBC 5.4 02/11/2020   HGB 13.9 02/11/2020   HCT 43.1 02/11/2020   MCV 94.3 02/11/2020   PLT 200 02/11/2020    Lab Results  Component Value Date   TSH 3.74 07/22/2018     Total time spent on today's visit was 30 minutes, including both face-to-face time and nonface-to-face time.  Time included that spent on review of records (prior notes available to me/labs/imaging if pertinent), discussing treatment and goals, answering patient's questions and coordinating care.  Cc:  Lavone Orn, MD

## 2020-09-23 ENCOUNTER — Ambulatory Visit: Payer: Medicare HMO | Admitting: Neurology

## 2020-09-23 ENCOUNTER — Encounter: Payer: Self-pay | Admitting: Neurology

## 2020-09-23 ENCOUNTER — Other Ambulatory Visit: Payer: Self-pay

## 2020-09-23 VITALS — BP 117/62 | HR 81 | Ht 67.0 in | Wt 191.0 lb

## 2020-09-23 DIAGNOSIS — G20 Parkinson's disease: Secondary | ICD-10-CM | POA: Diagnosis not present

## 2020-09-23 NOTE — Patient Instructions (Signed)
Good to see you today!  Exercise.  Take your medication on time at 7am/11am/4pm.  The physicians and staff at [facility] are committed to providing excellent care. You may receive a survey requesting feedback about your experience at our office. We strive to receive "very good" responses to the survey questions. If you feel that your experience would prevent you from giving the office a "very good " response, please contact our office to try to remedy the situation. We may be reached at [phone]. Thank you for taking the time out of your busy day to complete the survey.

## 2020-09-27 ENCOUNTER — Other Ambulatory Visit: Payer: Self-pay | Admitting: Urology

## 2020-09-30 ENCOUNTER — Ambulatory Visit: Payer: Medicare HMO | Admitting: Neurology

## 2021-01-03 ENCOUNTER — Other Ambulatory Visit: Payer: Self-pay | Admitting: Urology

## 2021-01-09 DIAGNOSIS — R102 Pelvic and perineal pain: Secondary | ICD-10-CM | POA: Diagnosis not present

## 2021-01-09 DIAGNOSIS — N401 Enlarged prostate with lower urinary tract symptoms: Secondary | ICD-10-CM | POA: Diagnosis not present

## 2021-01-09 DIAGNOSIS — R3914 Feeling of incomplete bladder emptying: Secondary | ICD-10-CM | POA: Diagnosis not present

## 2021-02-07 DIAGNOSIS — Z85828 Personal history of other malignant neoplasm of skin: Secondary | ICD-10-CM | POA: Diagnosis not present

## 2021-02-07 DIAGNOSIS — D224 Melanocytic nevi of scalp and neck: Secondary | ICD-10-CM | POA: Diagnosis not present

## 2021-02-07 DIAGNOSIS — L718 Other rosacea: Secondary | ICD-10-CM | POA: Diagnosis not present

## 2021-02-07 DIAGNOSIS — D485 Neoplasm of uncertain behavior of skin: Secondary | ICD-10-CM | POA: Diagnosis not present

## 2021-02-07 DIAGNOSIS — L570 Actinic keratosis: Secondary | ICD-10-CM | POA: Diagnosis not present

## 2021-02-07 DIAGNOSIS — D225 Melanocytic nevi of trunk: Secondary | ICD-10-CM | POA: Diagnosis not present

## 2021-02-07 DIAGNOSIS — L814 Other melanin hyperpigmentation: Secondary | ICD-10-CM | POA: Diagnosis not present

## 2021-02-07 DIAGNOSIS — D1801 Hemangioma of skin and subcutaneous tissue: Secondary | ICD-10-CM | POA: Diagnosis not present

## 2021-02-07 DIAGNOSIS — L905 Scar conditions and fibrosis of skin: Secondary | ICD-10-CM | POA: Diagnosis not present

## 2021-02-07 DIAGNOSIS — L821 Other seborrheic keratosis: Secondary | ICD-10-CM | POA: Diagnosis not present

## 2021-02-10 ENCOUNTER — Other Ambulatory Visit: Payer: Medicare HMO

## 2021-02-10 ENCOUNTER — Ambulatory Visit: Payer: Medicare HMO | Admitting: Hematology

## 2021-02-10 DIAGNOSIS — Z85038 Personal history of other malignant neoplasm of large intestine: Secondary | ICD-10-CM | POA: Diagnosis not present

## 2021-02-10 DIAGNOSIS — Z0000 Encounter for general adult medical examination without abnormal findings: Secondary | ICD-10-CM | POA: Diagnosis not present

## 2021-02-10 DIAGNOSIS — Z85820 Personal history of malignant melanoma of skin: Secondary | ICD-10-CM | POA: Diagnosis not present

## 2021-02-10 DIAGNOSIS — I2510 Atherosclerotic heart disease of native coronary artery without angina pectoris: Secondary | ICD-10-CM | POA: Diagnosis not present

## 2021-02-10 DIAGNOSIS — I700 Atherosclerosis of aorta: Secondary | ICD-10-CM | POA: Diagnosis not present

## 2021-02-10 DIAGNOSIS — I10 Essential (primary) hypertension: Secondary | ICD-10-CM | POA: Diagnosis not present

## 2021-02-10 DIAGNOSIS — Z1389 Encounter for screening for other disorder: Secondary | ICD-10-CM | POA: Diagnosis not present

## 2021-02-10 DIAGNOSIS — N400 Enlarged prostate without lower urinary tract symptoms: Secondary | ICD-10-CM | POA: Diagnosis not present

## 2021-02-10 DIAGNOSIS — G20 Parkinson's disease: Secondary | ICD-10-CM | POA: Diagnosis not present

## 2021-02-10 DIAGNOSIS — K219 Gastro-esophageal reflux disease without esophagitis: Secondary | ICD-10-CM | POA: Diagnosis not present

## 2021-02-10 NOTE — Progress Notes (Incomplete)
[name]   [phone] [phone]   Clinic Follow up Note   Patient Care Team: Lavone Orn, MD as PCP - General (Internal Medicine) Coralie Keens, MD as Consulting Physician (General Surgery) Truitt Merle, MD as Consulting Physician (Hematology) Tat, [name], DO as Consulting Physician (Neurology)  Date of Service:  02/10/2021  CHIEF COMPLAINT: Follow up stage I right colon cancer   SUMMARY OF ONCOLOGIC HISTORY: Oncology History Overview Note  Cancer Staging Malignant neoplasm of ascending colon s/p ileocolectomy 06/12/2017 Staging form: Colon and Rectum, AJCC 8th Edition - Pathologic stage from 06/12/2017: Stage I (pT2, pN0, cM0) - Signed by Alla Feeling, NP on 08/01/2017    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 Resected and retrieved. - Tumor in the proximal ascending colon. Biopsied. - The examination was otherwise normal.    04/15/2017 Pathology Results   Diagnosis 04/15/17 by Dr. Wynetta Emery  1. Colon, biopsy, at junction of cecum and ascending - INVASIVE ADENOCARCINOMA 2. Colon, polyp(s), descending - BENIGN COLORECTAL MUCOSA. - ASSOCIATED BENIGN LYMPHOID AGGREGATES. - MELANOSIS COLI PRESENT. - NO DYSPLASIA OR MALIGNANCY IDENTIFIED. Microscopic Comment 1. Dr. Lyndon Code has seen the first specimen (cecal and ascending colon biopsy) in consultation with agreement. The findings are called to [facility] who is taking the result for Dr. Wynetta Emery on 04/16/2017.   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 Coronary artery calcifications are noted suggesting coronary artery disease. Scarring is noted in both lungs. 4 mm nodule  is noted in left lung apex. No follow-up needed if patient is low-risk. Non-contrast chest CT can be considered in 12 months if patient is high-risk. This recommendation follows the consensus statement: Guidelines for Management of Incidental Pulmonary Nodules Detected on CT Images: From the Fleischner Society 2017; Radiology 2017; 284:228-243. Small rounded low density seen peripherally in right hepatic lobe which most likely represent cyst, but metastatic disease cannot be excluded given the history of colonic malignancy. Continued follow-up is recommended. 16 mm soft tissue density seen in the cecum which may represent neoplasm or malignancy, as noted on colonoscopy. Aortic atherosclerosis. Stable prostatic enlargement.   06/12/2017 Surgery   LAPAROSCOPIC ASSISTED PARTIAL COLECTOMY by Dr. Ninfa Linden    The [place] port was placed to the opening and insufflation of the abdomen was begun. 110 [name] reports then placed the patient's upper midline under direct vision. I could easily identify the cecum and ascending colon. There was a tattoo where the colonoscopy biopsy was performed indicating the level of the cancer. I saw no other intra-abdominal pathology. The liver appeared normal. I next mobilized the right colon along the white line of [place] free of the appendix is well with harmonic scalpel. I then took down the hepatic flexure freed gallbladder off of the colon with the Harmonic scalpel as well. The entire right colon became easily mobile and was able to easily move past the midline. At this point I converted to the open portion of procedure. I removed all ports. I created an incision between the 2 five-minute reports with scalpel. I then took this down through the fascia with electrocautery. The peritoneum was then opened entirely to the incision. I was then able to easily eviscerate the cecum and right colon  and distal ileum. I transected the distal ileum with the GIA 75 stapler. I then  transected the transverse colon proximally with the GIA 75 staplers well. I then took down the mesentery with the LigaSure cautery device. I easily palpate the mass inside the colon. The specimen was sent to pathology for evaluation. I then reapproximated the small bowel to the proximal colon in a side-to-side fashion with interrupted silk sutures. I performed a colostomy and enterotomy with the cautery and performed a side-to-side anastomosis with a single firing the GIA 75 stapler. The opening was then closed with TA 60 stapler. I evaluated stable on hemostasis appeared to be achieved prior to closing with a TA 60. I then reinforced stapler with interrupted silk sutures and closed the mesenteric defect silk sutures as well. Again the anastomosis appeared pink and well-perfused.  I then placed colon back into the abdomen. I then irrigated the abdomen with several liters normal saline. Hemostasis again appeared to be achieved.   06/12/2017 Pathology Results   Diagnosis 06/12/17 Colon, segmental resection for tumor, right colon - ADENOCARCINOMA, MODERATELY DIFFERENTIATED - NO CARCINOMA IDENTIFIED IN NINETEEN LYMPH NODES (0/19) - MARGINS UNINVOLVED BY CARCINOMA - BENIGN APPENDIX WITH LUMINAL FIBROSIS - SEE ONCOLOGY TABLE BELOW      CURRENT THERAPY:  Colon cancer surveillance   INTERVAL HISTORY: *** Kevin Mathews is here for a follow up of colon cancer. He was last seen by me 02/2019 and seen by NP Lacie 1 year ago. He presents to the clinic alone.    REVIEW OF SYSTEMS:  *** Constitutional: Denies fevers, chills or abnormal weight loss Eyes: Denies blurriness of vision Ears, nose, mouth, throat, and face: Denies mucositis or sore throat Respiratory: Denies cough, dyspnea or wheezes Cardiovascular: Denies palpitation, chest discomfort or lower extremity swelling Gastrointestinal:  Denies nausea, heartburn or change in bowel habits Skin: Denies abnormal skin rashes Lymphatics: Denies new  lymphadenopathy or easy bruising Neurological:Denies numbness, tingling or new weaknesses Behavioral/Psych: Mood is stable, no new changes  All other systems were reviewed with the patient and are negative.  MEDICAL HISTORY:  Past Medical History:  Diagnosis Date  . Arthritis   . BPH (benign prostatic hyperplasia)   . Dyspnea    with excertion  . Frequent urination   . GERD (gastroesophageal reflux disease)    occasional  . History of kidney stones    passed  . Hypertension   . 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 COLONOSCOPY WITH PROPOFOL N/A 04/15/2017   Procedure: COLONOSCOPY WITH PROPOFOL;  Surgeon: Garlan Fair, MD;  Location: [facility];  Service: Endoscopy;  Laterality: N/A;  . LAPAROSCOPIC PARTIAL COLECTOMY N/A 06/12/2017   Procedure: LAPAROSCOPIC ASSISTED PARTIAL COLECTOMY;  Surgeon: Coralie Keens, MD;  Location: [facility];  Service: General;  Laterality: N/A;  . NASAL SINUS SURGERY  1979  . TOTAL SHOULDER ARTHROPLASTY Left 07/01/2013   Procedure: TOTAL SHOULDER ARTHROPLASTY;  Surgeon: Ninetta Lights, MD;  Location: [facility];  Service: Orthopedics;  Laterality: Left;  with interscalene block  . TOTAL SHOULDER ARTHROPLASTY Right 03/18/2018   Procedure: RIGHT TOTAL SHOULDER ARTHROPLASTY, BICEPS TENODESIS;  Surgeon: Renette Butters, MD;  Location: [facility];  Service: Orthopedics;  Laterality: Right;    I have reviewed the social history and family history with the patient and they are unchanged from previous note.  ALLERGIES:  has No Known Allergies.  MEDICATIONS:  Current Outpatient Medications  Medication Sig Dispense Refill  . acetaminophen (TYLENOL) 325 MG tablet Take 650 mg by mouth every 6 (six) hours as needed.    Marland Kitchen aspirin EC 81 MG tablet Take 81 mg by mouth 3 (three) times a week.     Marland Kitchen atorvastatin (LIPITOR) 10 MG tablet Take 10 mg by mouth 4  (four) times a week.     Marland Kitchen b complex vitamins capsule Take 1 capsule by mouth daily.    . carbidopa-levodopa (SINEMET IR) 25-100 MG tablet TAKE 1 TABLET BY MOUTH THREE TIMES DAILY (Patient taking differently: Take 1 tablet by mouth 3 (three) times daily. 1 tab 8:30am/ 1 at 1:30pm and 1 tab at 7pm) 270 tablet 2  . Cholecalciferol ([employer] VITAMIN D3 PO) Take 1 tablet by mouth daily.    Marland Kitchen dexlansoprazole (DEXILANT) 60 MG capsule Take 60 mg by mouth daily as needed (acid reflux).    Marland Kitchen doxazosin (CARDURA) 4 MG tablet Take 4 mg by mouth every evening.     . finasteride (PROSCAR) 5 MG tablet Take 1 tablet by mouth once daily 90 tablet 0  . hydrochlorothiazide (MICROZIDE) 12.5 MG capsule Take 12.5 mg by mouth every evening.     Marland Kitchen ibuprofen (ADVIL,MOTRIN) 200 MG tablet Take 400 mg by mouth 2 (two) times daily as needed for headache or mild pain.     . Multiple Vitamins-Minerals (ADULT GUMMY PO) Take 2 tablets by mouth daily.      No current facility-administered medications for this visit.    PHYSICAL EXAMINATION: ECOG PERFORMANCE STATUS: {CHL ONC ECOG [phone]}  There were no vitals filed for this visit. There were no vitals filed for this visit. *** GENERAL:alert, no distress and comfortable SKIN: skin color, texture, turgor are normal, no rashes or significant lesions EYES: normal, Conjunctiva are pink and non-injected, sclera clear {OROPHARYNX:no exudate, no erythema and lips, buccal mucosa, and tongue normal}  NECK: supple, thyroid normal size, non-tender, without nodularity LYMPH:  no palpable lymphadenopathy in the cervical, axillary {or inguinal} LUNGS: clear to auscultation and percussion with normal breathing effort HEART: regular rate & rhythm and no murmurs and no lower extremity edema ABDOMEN:abdomen soft, non-tender and normal bowel sounds Musculoskeletal:no cyanosis of digits and no clubbing  NEURO: alert & oriented x 3 with fluent speech, no focal motor/sensory  deficits  LABORATORY DATA:  I have reviewed the data as listed CBC Latest Ref Rng & Units 02/11/2020 02/12/2019 08/14/2018  WBC 4.0 - 10.5 K/uL 5.4 5.8 5.8  Hemoglobin 13.0 - 17.0 g/dL 13.9 14.0 13.6  Hematocrit 39.0 - 52.0 % 43.1 43.5 41.9  Platelets 150 - 400 K/uL 200 188 187     CMP Latest Ref Rng & Units 02/11/2020 02/12/2019 08/14/2018  Glucose 70 - 99 mg/dL 115(H) 96 96  BUN 8 - 23 mg/dL 21 22 20   Creatinine 0.61 - 1.24 mg/dL 1.10 0.96 0.96  Sodium 135 - 145 mmol/L 143 142 141  Potassium 3.5 - 5.1 mmol/L 4.2 4.1 4.4  Chloride 98 - 111 mmol/L 108 109 107  CO2 22 - 32 mmol/L 26 25 26   Calcium 8.9 - 10.3 mg/dL 9.4 9.2 9.6  Total Protein 6.5 - 8.1 g/dL 6.9 7.2 7.1  Total Bilirubin 0.3 - 1.2 mg/dL 0.5 0.7 0.6  Alkaline Phos 38 - 126 U/L 90 97 107  AST 15 - 41 U/L 18 16 19   ALT 0 - 44 U/L <6 <6 20      RADIOGRAPHIC STUDIES: I have personally reviewed  the radiological images as listed and agreed with the findings in the report. No results found.   ASSESSMENT & PLAN:  Kevin Mathews is a 80 y.o. male with    1. Malignant neoplasm of ascending colon, pT2N0M0, stage I, G2, MMR abnormal, MSI-H  -he was diagnosed in 2018,s/ppartial ileocolectomy on 06/12/2017.  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 No clinically significant variants were detected in the KRAS, NRAS, or BRAF genes. MLH1 hypermethylation is present, suggesting somatic mutation and therefore not lynch syndrome -I have requested records from Dr. Paulita Fujita to see if he is up to date on colonoscopy  -From a colon cancer standpoint, Mr. Minar is clinically doing well. Labs are unremarkable, CEA normal, exam is benign. No clinical concern for recurrence.  -Continue surveillance, healthy diet, exercise -F/u annually   2. Hematuria  -work up pending per Dr. Karsten Ro; I have requested records on this   3. Melanoma, scalp -S/p excision, healing;  I have requested path report   4. 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  -No clinically significant variants were detected in the KRAS, NRAS, or BRAF genes.  -MLH1 hypermethylation is present, suggesting somatic mutation and therefore not lynch syndrome. I discussed with Dr. Burr Medico -Given the above and his personal history, as well as new diagnosis of melanoma, I recommend for him to see our genetic counselor for more in depth discussion today. He agrees and is interested, I referred him  5. Low density lesion in liver and lung nodule, likely benign  -Case previously discussed with Dr. Nyoka Cowden, Radiologist, who compared 04/2017 CT C/A/P with 2016 CT abdomen and MRI abdomen. He confirms the area in question is the same size and location as previously seen in 2016 and has not changed; confirms this is a cyst and does not require further testing.  -4 mm left lung apex nodule on 04/25/17 CT Scan -He is low risk for lung cancer based on light smoking history   6. Parkinson disease  -f/u with Tat -stable, remains very active   PLAN: -Labs reviewed -Continue colon cancer surveillance  -Requested records from Dr. Paulita Fujita on patient's last colonoscopy -Referral to genetics  -Scalp, requested path (melanoma) -Continue work up for hematuria per Dr. Karsten Ro -Surveillance f/u annually, or sooner if needed if above work up is abnormal    No problem-specific Assessment & Plan notes found for this encounter.   No orders of the defined types were placed in this encounter.  All questions were answered. The patient knows to call the clinic with any problems, questions or concerns. No barriers to learning was detected. The total time spent in the appointment was {CHL ONC TIME VISIT - QVLDK:[id number]}.     Joslyn Devon 02/10/2021   Oneal Deputy, am acting as scribe for Truitt Merle, MD.   {Add scribe  attestation statement}

## 2021-02-13 ENCOUNTER — Telehealth: Payer: Self-pay | Admitting: Hematology

## 2021-02-13 NOTE — Telephone Encounter (Signed)
Scheduled appts per 3/14 sch msg. Pts wife is aware.

## 2021-02-15 ENCOUNTER — Inpatient Hospital Stay: Payer: Medicare HMO | Admitting: Hematology

## 2021-02-15 ENCOUNTER — Inpatient Hospital Stay: Payer: Medicare HMO

## 2021-02-21 DIAGNOSIS — Z85038 Personal history of other malignant neoplasm of large intestine: Secondary | ICD-10-CM | POA: Diagnosis not present

## 2021-02-21 DIAGNOSIS — K5900 Constipation, unspecified: Secondary | ICD-10-CM | POA: Diagnosis not present

## 2021-02-24 NOTE — Progress Notes (Signed)
Kevin Mathews   [phone]) [phone] Fax:(336) [phone]   Clinic Follow up Note   Patient Care Team: Kevin Orn, MD as PCP - General (Internal Medicine) Kevin Keens, MD as Consulting Physician (General Surgery) Kevin Merle, MD as Consulting Physician (Hematology) Kevin Mathews, Kevin Quail, DO as Consulting Physician (Neurology)  Date of Service:  02/27/2021  CHIEF COMPLAINT:  Follow up stage I right colon cancer   SUMMARY OF ONCOLOGIC HISTORY: Oncology History Overview Note  Cancer Staging Malignant neoplasm of ascending colon s/p ileocolectomy 06/12/2017 Staging form: Colon and Rectum, AJCC 8th Edition - Pathologic stage from 06/12/2017: Stage I (pT2, pN0, cM0) - Signed by Alla Feeling, NP on 08/01/2017    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 Resected and retrieved. - Tumor in the proximal ascending colon. Biopsied. - The examination was otherwise normal.    04/15/2017 Pathology Results   Diagnosis 04/15/17 by Dr. Wynetta Emery  1. Colon, biopsy, at junction of cecum and ascending - INVASIVE ADENOCARCINOMA 2. Colon, polyp(s), descending - BENIGN COLORECTAL MUCOSA. - ASSOCIATED BENIGN LYMPHOID AGGREGATES. - MELANOSIS COLI PRESENT. - NO DYSPLASIA OR MALIGNANCY IDENTIFIED. Microscopic Comment 1. Dr. Lyndon Code has seen the first specimen (cecal and ascending colon biopsy) in consultation with agreement. The findings are called to [facility] who is taking the result for Dr. Wynetta Emery on 04/16/2017.   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 Coronary artery calcifications are noted suggesting coronary artery disease. Scarring is noted in both lungs. 4 mm  nodule is noted in left lung apex. No follow-up needed if patient is low-risk. Non-contrast chest CT can be considered in 12 months if patient is high-risk. This recommendation follows the consensus statement: Guidelines for Management of Incidental Pulmonary Nodules Detected on CT Images: From the Fleischner Society 2017; Radiology 2017; 284:228-243. Small rounded low density seen peripherally in right hepatic lobe which most likely represent cyst, but metastatic disease cannot be excluded given the history of colonic malignancy. Continued follow-up is recommended. 16 mm soft tissue density seen in the cecum which may represent neoplasm or malignancy, as noted on colonoscopy. Aortic atherosclerosis. Stable prostatic enlargement.   06/12/2017 Surgery   LAPAROSCOPIC ASSISTED PARTIAL COLECTOMY by Dr. Ninfa Linden    The [place] port was placed to the opening and insufflation of the abdomen was begun. 85 [name] reports then placed the patient's upper midline under direct vision. I could easily identify the cecum and ascending colon. There was a tattoo where the colonoscopy biopsy was performed indicating the level of the cancer. I saw no other intra-abdominal pathology. The liver appeared normal. I next mobilized the right colon along the white line of [place] free of the appendix is well with harmonic scalpel. I then took down the hepatic flexure freed gallbladder off of the colon with the Harmonic scalpel as well. The entire right colon became easily mobile and was able to easily move past the midline. At this point I converted to the open portion of procedure. I removed all ports. I created an incision between the 2 five-minute reports with scalpel. I then took this down through the fascia with electrocautery. The peritoneum was then opened entirely to the incision. I was then able to easily eviscerate the cecum and right  colon and distal ileum. I transected the distal ileum with the GIA 75 stapler. I  then transected the transverse colon proximally with the GIA 75 staplers well. I then took down the mesentery with the LigaSure cautery device. I easily palpate the mass inside the colon. The specimen was sent to pathology for evaluation. I then reapproximated the small bowel to the proximal colon in a side-to-side fashion with interrupted silk sutures. I performed a colostomy and enterotomy with the cautery and performed a side-to-side anastomosis with a single firing the GIA 75 stapler. The opening was then closed with TA 60 stapler. I evaluated stable on hemostasis appeared to be achieved prior to closing with a TA 60. I then reinforced stapler with interrupted silk sutures and closed the mesenteric defect silk sutures as well. Again the anastomosis appeared pink and well-perfused.  I then placed colon back into the abdomen. I then irrigated the abdomen with several liters normal saline. Hemostasis again appeared to be achieved.   06/12/2017 Pathology Results   Diagnosis 06/12/17 Colon, segmental resection for tumor, right colon - ADENOCARCINOMA, MODERATELY DIFFERENTIATED - NO CARCINOMA IDENTIFIED IN NINETEEN LYMPH NODES (0/19) - MARGINS UNINVOLVED BY CARCINOMA - BENIGN APPENDIX WITH LUMINAL FIBROSIS - SEE ONCOLOGY TABLE BELOW   02/23/2020 Imaging   MRI Abdomen  IMPRESSION: 1. 7 mm lesion identified in the inferior right liver along the gallbladder fossa, corresponding to the abnormality seen on the recent CT scan. Lesion likely represents a flash filling hemangioma. Repeat MRI or CT in 3-6 months recommended to ensure stability. 2. Bilateral simple renal cysts with possible 15 mm Bosniak II cyst interpolar right kidney (versus 2 adjacent tiny simple cyst). 3. Small to moderate hiatal hernia. 4. Hepatic steatosis.      CURRENT THERAPY:  Colon cancer surveillance   INTERVAL HISTORY:  Kevin Mathews is here for a follow up of colon cancer. He was last seen by me 2 years ago and seen by  NP Lacie 1 year ago in interim. He presents to the clinic with his wife. He notes he is doing well. I reviewed his medication list with him. He was seen by Dr Coral Mathews for another routine colonoscopy in June. He denies any recent changes. He denies blood in stool. He notes his BM is constipated occasionally. He has been taking Benafiber and Miralax to control this. He denies bloating, nausea. His Acid Reflux is under control. He takes aleve instead of Tylenol.   He has had COVID vaccine and booster.     REVIEW OF SYSTEMS:   Constitutional: Denies fevers, chills or abnormal weight loss Eyes: Denies blurriness of vision Ears, nose, mouth, throat, and face: Denies mucositis or sore throat Respiratory: Denies cough, dyspnea or wheezes Cardiovascular: Denies palpitation, chest discomfort or lower extremity swelling Gastrointestinal:  Denies nausea, heartburn (+) Occasional constipation Skin: Denies abnormal skin rashes Lymphatics: Denies new lymphadenopathy or easy bruising Neurological:Denies numbness, tingling or new weaknesses Behavioral/Psych: Mood is stable, no new changes  All other systems were reviewed with the patient and are negative.  MEDICAL HISTORY:  Past Medical History:  Diagnosis Date  . Arthritis   . BPH (benign prostatic hyperplasia)   . Dyspnea    with excertion  . Frequent urination   . GERD (gastroesophageal reflux disease)    occasional  . History of kidney stones    passed  . Hypertension   . 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 COLONOSCOPY WITH PROPOFOL N/A 04/15/2017   Procedure: COLONOSCOPY WITH PROPOFOL;  Surgeon: Garlan Fair, MD;  Location: [facility];  Service: Endoscopy;  Laterality: N/A;  . LAPAROSCOPIC PARTIAL COLECTOMY N/A 06/12/2017   Procedure: LAPAROSCOPIC ASSISTED PARTIAL COLECTOMY;  Surgeon: Kevin Keens, MD;  Location: [facility]  [facility];  Service: General;  Laterality: N/A;  . NASAL SINUS SURGERY  1979  . TOTAL SHOULDER ARTHROPLASTY Left 07/01/2013   Procedure: TOTAL SHOULDER ARTHROPLASTY;  Surgeon: Ninetta Lights, MD;  Location: [facility];  Service: Orthopedics;  Laterality: Left;  with interscalene block  . TOTAL SHOULDER ARTHROPLASTY Right 03/18/2018   Procedure: RIGHT TOTAL SHOULDER ARTHROPLASTY, BICEPS TENODESIS;  Surgeon: Renette Butters, MD;  Location: [facility];  Service: Orthopedics;  Laterality: Right;    I have reviewed the social history and family history with the patient and they are unchanged from previous note.  ALLERGIES:  has No Known Allergies.  MEDICATIONS:  Current Outpatient Medications  Medication Sig Dispense Refill  . atorvastatin (LIPITOR) 10 MG tablet Take 10 mg by mouth 4 (four) times a week.     Marland Kitchen b complex vitamins capsule Take 1 capsule by mouth daily.    . carbidopa-levodopa (SINEMET IR) 25-100 MG tablet Take 1 tablet by mouth 3 (three) times daily. 8:30/1:30/7 270 tablet 0  . Cholecalciferol ([employer]S VITAMIN D3 PO) Take 1 tablet by mouth daily.    Marland Kitchen dexlansoprazole (DEXILANT) 60 MG capsule Take 60 mg by mouth daily as needed (acid reflux).    Marland Kitchen doxazosin (CARDURA) 4 MG tablet Take 4 mg by mouth every evening.     . finasteride (PROSCAR) 5 MG tablet Take 1 tablet by mouth once daily 90 tablet 0  . hydrochlorothiazide (MICROZIDE) 12.5 MG capsule Take 12.5 mg by mouth every evening.     Marland Kitchen ibuprofen (ADVIL,MOTRIN) 200 MG tablet Take 400 mg by mouth 2 (two) times daily as needed for headache or mild pain.     . Multiple Vitamins-Minerals (ADULT GUMMY PO) Take 2 tablets by mouth daily.      No current facility-administered medications for this visit.    PHYSICAL EXAMINATION: ECOG PERFORMANCE STATUS: 1 - Symptomatic but completely ambulatory  Vitals:   02/27/21 1010  BP: 124/65  Pulse: (!) 53  Resp: 18  Temp: (!) 95.6 F (35.3 C)  SpO2: 99%   Filed Weights   02/27/21 1010  Weight: 191 lb  1.6 oz (86.7 kg)    GENERAL:alert, no distress and comfortable SKIN: skin color, texture, turgor are normal, no rashes or significant lesions EYES: normal, Conjunctiva are pink and non-injected, sclera clear  NECK: supple, thyroid normal size, non-tender, without nodularity LYMPH:  no palpable lymphadenopathy in the cervical, axillary  LUNGS: clear to auscultation and percussion with normal breathing effort HEART: regular rate & rhythm and no murmurs (+) Mild lower extremity edema ABDOMEN:abdomen soft, non-tender and normal bowel sounds Musculoskeletal:no cyanosis of digits and no clubbing  NEURO: alert & oriented x 3 with fluent speech, no focal motor/sensory deficits  LABORATORY DATA:  I have reviewed the data as listed CBC Latest Ref Rng & Units 02/27/2021 02/11/2020 02/12/2019  WBC 4.0 - 10.5 K/uL 5.4 5.4 5.8  Hemoglobin 13.0 - 17.0 g/dL 15.1 13.9 14.0  Hematocrit 39.0 - 52.0 % 46.4 43.1 43.5  Platelets 150 - 400 K/uL 201 200 188     CMP Latest Ref Rng & Units 02/27/2021 02/11/2020 02/12/2019  Glucose 70 - 99 mg/dL 106(H) 115(H) 96  BUN 8 - 23 mg/dL 16 21 22   Creatinine 0.61 - 1.24 mg/dL 1.11 1.10 0.96  Sodium 135 - 145 mmol/L 140 143 142  Potassium 3.5 - 5.1 mmol/L 4.3 4.2 4.1  Chloride 98 - 111 mmol/L 104 108 109  CO2 22 - 32 mmol/L 27 26 25   Calcium 8.9 - 10.3 mg/dL 9.7 9.4 9.2  Total Protein 6.5 - 8.1 g/dL 7.3 6.9 7.2  Total Bilirubin 0.3 - 1.2 mg/dL 0.7 0.5 0.7  Alkaline Phos 38 - 126 U/L 100 90 97  AST 15 - 41 U/L 18 18 16   ALT 0 - 44 U/L 8 <6 <6      RADIOGRAPHIC STUDIES: I have personally reviewed the radiological images as listed and agreed with the findings in the report. No results found.   ASSESSMENT & PLAN:  ABDO DENAULT is a 80 y.o. male with    1. Malignant neoplasm of ascending colon, pT2N0M0, stage I, G2 -He was diagnosed in 04/2017, s/p partial ileocolectomy on 06/12/2017.  -Repeated colonoscopy in 2018 was unremarkable. Patient opted for final  colonoscopy in June 2022.  -He is clinically doing well. Labs reviewed, CBC and CMP WNL. CEA still pending. Physical exam was unremarkable. -He is almost 4 years since his cancer diagnosis. His risk of recurrence has significantly decreased. Will continue 5 year surveillance plan. I do not plan to do another surveillance scan.  -F/u in 1 year for last visit    2. Low density lesion in liver -is 04/2017 CT scan showed a small rounded low density seen peripherally in right hepatic lobe most likely represents a cyst -Dr. Nyoka Cowden, Radiologist, who compared 04/2017 CT C/A/P with 2016 CT abdomen and MRI abdomen. He confirms the area in question is the same size and location as previously seen in 2016 and has not changed; confirms this is a cyst and does not require further testing.    3. Lung Nodule, benign  -4 mm left lung apex nodule on 04/25/17 CT Scan, likely benign  -He is low risk for lung cancer based on light smoking history   4. Parkinson disease  -f/u with Kevin Mathews   3. Melanoma, scalp -S/p excision on 12/08/19.   PLAN:  -lab and f/u in 1 year with NP Lacie   No problem-specific Assessment & Plan notes found for this encounter.   No orders of the defined types were placed in this encounter.  All questions were answered. The patient knows to call the clinic with any problems, questions or concerns. No barriers to learning was detected. The total time spent in the appointment was 20 minutes.     Kevin Merle, MD 02/27/2021   I, Joslyn Devon, am acting as scribe for Kevin Merle, MD.   I have reviewed the above documentation for accuracy and completeness, and I agree with the above.

## 2021-02-25 ENCOUNTER — Other Ambulatory Visit: Payer: Self-pay | Admitting: Neurology

## 2021-02-27 ENCOUNTER — Encounter: Payer: Self-pay | Admitting: Hematology

## 2021-02-27 ENCOUNTER — Inpatient Hospital Stay: Payer: Medicare HMO | Admitting: Hematology

## 2021-02-27 ENCOUNTER — Other Ambulatory Visit: Payer: Self-pay

## 2021-02-27 ENCOUNTER — Telehealth: Payer: Self-pay | Admitting: Hematology

## 2021-02-27 ENCOUNTER — Inpatient Hospital Stay: Payer: Medicare HMO | Attending: Hematology

## 2021-02-27 VITALS — BP 124/65 | HR 53 | Temp 95.6°F | Resp 18 | Wt 191.1 lb

## 2021-02-27 DIAGNOSIS — Z85038 Personal history of other malignant neoplasm of large intestine: Secondary | ICD-10-CM | POA: Diagnosis not present

## 2021-02-27 DIAGNOSIS — I10 Essential (primary) hypertension: Secondary | ICD-10-CM | POA: Diagnosis not present

## 2021-02-27 DIAGNOSIS — C182 Malignant neoplasm of ascending colon: Secondary | ICD-10-CM

## 2021-02-27 DIAGNOSIS — G20 Parkinson's disease: Secondary | ICD-10-CM | POA: Diagnosis not present

## 2021-02-27 DIAGNOSIS — N400 Enlarged prostate without lower urinary tract symptoms: Secondary | ICD-10-CM | POA: Diagnosis not present

## 2021-02-27 DIAGNOSIS — Z96611 Presence of right artificial shoulder joint: Secondary | ICD-10-CM | POA: Diagnosis not present

## 2021-02-27 DIAGNOSIS — Z85820 Personal history of malignant melanoma of skin: Secondary | ICD-10-CM | POA: Insufficient documentation

## 2021-02-27 DIAGNOSIS — Z79899 Other long term (current) drug therapy: Secondary | ICD-10-CM | POA: Insufficient documentation

## 2021-02-27 LAB — CBC WITH DIFFERENTIAL (CANCER CENTER ONLY)
Abs Immature Granulocytes: 0.08 10*3/uL — ABNORMAL HIGH (ref 0.00–0.07)
Basophils Absolute: 0 10*3/uL (ref 0.0–0.1)
Basophils Relative: 1 %
Eosinophils Absolute: 0.3 10*3/uL (ref 0.0–0.5)
Eosinophils Relative: 5 %
HCT: 46.4 % (ref 39.0–52.0)
Hemoglobin: 15.1 g/dL (ref 13.0–17.0)
Immature Granulocytes: 2 %
Lymphocytes Relative: 21 %
Lymphs Abs: 1.1 10*3/uL (ref 0.7–4.0)
MCH: 29.8 pg (ref 26.0–34.0)
MCHC: 32.5 g/dL (ref 30.0–36.0)
MCV: 91.7 fL (ref 80.0–100.0)
Monocytes Absolute: 0.4 10*3/uL (ref 0.1–1.0)
Monocytes Relative: 7 %
Neutro Abs: 3.5 10*3/uL (ref 1.7–7.7)
Neutrophils Relative %: 64 %
Platelet Count: 201 10*3/uL (ref 150–400)
RBC: 5.06 MIL/uL (ref 4.22–5.81)
RDW: 13.2 % (ref 11.5–15.5)
WBC Count: 5.4 10*3/uL (ref 4.0–10.5)
nRBC: 0 % (ref 0.0–0.2)

## 2021-02-27 LAB — CMP (CANCER CENTER ONLY)
ALT: 8 U/L (ref 0–44)
AST: 18 U/L (ref 15–41)
Albumin: 4.1 g/dL (ref 3.5–5.0)
Alkaline Phosphatase: 100 U/L (ref 38–126)
Anion gap: 9 (ref 5–15)
BUN: 16 mg/dL (ref 8–23)
CO2: 27 mmol/L (ref 22–32)
Calcium: 9.7 mg/dL (ref 8.9–10.3)
Chloride: 104 mmol/L (ref 98–111)
Creatinine: 1.11 mg/dL (ref 0.61–1.24)
GFR, Estimated: >60 mL/min (ref >60)
Glucose, Bld: 106 mg/dL — ABNORMAL HIGH (ref 70–99)
Potassium: 4.3 mmol/L (ref 3.5–5.1)
Sodium: 140 mmol/L (ref 135–145)
Total Bilirubin: 0.7 mg/dL (ref 0.3–1.2)
Total Protein: 7.3 g/dL (ref 6.5–8.1)

## 2021-02-27 LAB — CEA (IN HOUSE-CHCC): CEA (CHCC-In House): 2.06 ng/mL (ref 0.00–5.00)

## 2021-02-27 NOTE — Telephone Encounter (Signed)
Rx(s) sent to pharmacy electronically.  

## 2021-02-27 NOTE — Telephone Encounter (Signed)
Scheduled per los. Gave avs and calendar  

## 2021-03-06 ENCOUNTER — Telehealth: Payer: Self-pay

## 2021-03-06 NOTE — Telephone Encounter (Signed)
Called x 2 no answer unable to leave a message

## 2021-03-06 NOTE — Telephone Encounter (Signed)
-----   Message from Truitt Merle, MD sent at 03/03/2021  7:30 AM EDT ----- Let pt know his CEA was normal, good news. Thanks   Truitt Merle  03/03/2021

## 2021-03-27 NOTE — Progress Notes (Signed)
Assessment/Plan:   1.  Parkinsons Disease  -Continue carbidopa/levodopa 25/100, 1 tablet 3 times daily.  Discussed again about importance of taking tid and having regular schedule.   -Congratulated him for all the exercise.  2.  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  Understands that Parkinson's does increase risk for melanoma.  4.  History of colon cancer  -Follows with oncology   Subjective:   Kevin Mathews was seen today in follow up for Parkinsons disease.  Pt with wife who supplements hx. My previous records were reviewed prior to todays visit as well as outside records available to me. Pt denies falls.  Pt denies lightheadedness, near syncope.  No hallucinations.  Mood has been good.  Last followed up with oncology for his history of colon cancer on March 28.  Records reviewed.  They felt his risk of recurrence was very low.  Current prescribed movement disorder medications: Carbidopa/levodopa 25/100, 1 tablet 3 times per day   ALLERGIES:  No Known Allergies  CURRENT MEDICATIONS:  Outpatient Encounter Medications as of 03/29/2021  Medication Sig  . atorvastatin (LIPITOR) 10 MG tablet Take 10 mg by mouth 4 (four) times a week.   Marland Kitchen b complex vitamins capsule Take 1 capsule by mouth daily.  . carbidopa-levodopa (SINEMET IR) 25-100 MG tablet Take 1 tablet by mouth 3 (three) times daily. 8:30/1:30/7  . Cholecalciferol ([employer]S VITAMIN D3 PO) Take 1 tablet by mouth daily.  Marland Kitchen dexlansoprazole (DEXILANT) 60 MG capsule Take 60 mg by mouth daily as needed (acid reflux).  Marland Kitchen doxazosin (CARDURA) 4 MG tablet Take 4 mg by mouth every evening.   . finasteride (PROSCAR) 5 MG tablet Take 1 tablet by mouth once daily  . hydrochlorothiazide (MICROZIDE) 12.5 MG capsule Take 12.5 mg by mouth every evening.   Marland Kitchen ibuprofen (ADVIL,MOTRIN) 200 MG tablet Take  400 mg by mouth 2 (two) times daily as needed for headache or mild pain.   . Multiple Vitamins-Minerals (ADULT GUMMY PO) Take 2 tablets by mouth daily.    No facility-administered encounter medications on file as of 03/29/2021.    Objective:   PHYSICAL EXAMINATION:    VITALS:   Vitals:   03/29/21 0937  BP: 118/72  Pulse: (!) 56  SpO2: 97%  Weight: 189 lb (85.7 kg)  Height: 5\' 7"  (1.702 m)    GEN:  The patient appears stated age and is in NAD. HEENT:  Normocephalic, atraumatic.  The mucous membranes are moist. The superficial temporal arteries are without ropiness or tenderness. CV:  RRR Lungs:  CTAB Neck/HEME:  There are no carotid bruits bilaterally.  Neurological examination:  Orientation: The patient is alert and oriented x3. Cranial nerves: There is good facial symmetry with facial hypomimia. The speech is fluent and clear. Soft palate rises symmetrically and there is no tongue deviation. Hearing is intact to conversational tone. Sensation: Sensation is intact to light touch throughout Motor: Strength is at least antigravity x4.  Movement examination: Tone: There is nl tone in the UE/LE Abnormal movements: there is LUE rest tremor  Coordination:  There is no decremation with RAM's Gait and Station: The patient has no difficulty arising out of a deep-seated chair without the use of the hands. The patient's stride length is good with LUE rest tremor.     I have reviewed and interpreted the following labs independently  Chemistry      Component Value Date/Time   NA 140 02/27/2021 0949   K 4.3 02/27/2021 0949   CL 104 02/27/2021 0949   CO2 27 02/27/2021 0949   BUN 16 02/27/2021 0949   CREATININE 1.11 02/27/2021 0949      Component Value Date/Time   CALCIUM 9.7 02/27/2021 0949   ALKPHOS 100 02/27/2021 0949   AST 18 02/27/2021 0949   ALT 8 02/27/2021 0949   BILITOT 0.7 02/27/2021 0949       Lab Results  Component Value Date   WBC 5.4 02/27/2021   HGB  15.1 02/27/2021   HCT 46.4 02/27/2021   MCV 91.7 02/27/2021   PLT 201 02/27/2021    Lab Results  Component Value Date   TSH 3.74 07/22/2018     Total time spent on today's visit was 30 minutes, including both face-to-face time and nonface-to-face time.  Time included that spent on review of records (prior notes available to me/labs/imaging if pertinent), discussing treatment and goals, answering patient's questions and coordinating care.  Cc:  Lavone Orn, MD

## 2021-03-29 ENCOUNTER — Other Ambulatory Visit: Payer: Self-pay | Admitting: Urology

## 2021-03-29 ENCOUNTER — Other Ambulatory Visit: Payer: Self-pay

## 2021-03-29 ENCOUNTER — Encounter: Payer: Self-pay | Admitting: Neurology

## 2021-03-29 ENCOUNTER — Ambulatory Visit: Payer: Medicare HMO | Admitting: Neurology

## 2021-03-29 VITALS — BP 118/72 | HR 56 | Ht 67.0 in | Wt 189.0 lb

## 2021-03-29 DIAGNOSIS — G20 Parkinson's disease: Secondary | ICD-10-CM | POA: Diagnosis not present

## 2021-03-29 NOTE — Patient Instructions (Signed)
Online Resources for Power over Parkinson's Group April 2022  . Local [place] Online Groups  o Power over Parkinson's Group :   - Power Over Parkinson's Patient Education Group will be Wednesday, April 13th at 2pm via Zoom.   - Upcoming Power over Parkinson's Meetings:  2nd Wednesdays of the month at 2 pm:  May 11th, June 8th - Contact Amy Marriott at [email or web address] if interested in participating in this online group o Parkinson's Care Partners Group:    3rd Mondays, Contact Misty Palladino o Atypical Parkinsonian Patient Group:   4th Wednesdays, Contact Misty Palladino o If you are interested in participating in these online groups with Misty, please contact her directly for how to join those meetings.  Her contact information is [email or web address]  She will send you a link to join the Zoom meeting.    . Parkinson Foundation:  www.[email or web address] o PD Health at Home continues:  Mindfulness Mondays, Expert Briefing Tuesdays, Wellness Wednesdays, Take Time Thursdays, Fitness Fridays -Listings for March 2022 are on the website o Upcoming Webinar:  Can We Put the Brakes on PD Progression?  Wednesday, April 6th @ 1 pm o Register for expert briefings (webinars) at [email or web address] o  Please check out their website to sign up for emails and see their full online offerings  . Michael J Fox Foundation:  www.[email or web address]  o Upcoming Webinar:   New to Parkinson's?  Steps to Take Today.  Thursday, March 21st @ 12 noon o Check out additional information on their website to see their full online offerings  . Davis Phinney Foundation:  www.[email or web address].org o Upcoming Webinar:  Stay tuned o Care Partner Monthly Meetup.  With Connie Carpenter Phinney.  First Tuesday of each month, 2 pm o Check out additional information to Live Well Today on their website  . Parkinson and Movement Disorders (PMD) Alliance:  www.pmdalliance.org o NeuroLife Online:   Online Education Events o Sign up for emails, which are sent weekly to give you updates on programming and online offerings     . Parkinson's Association of the [place]:  www.parkinsonassociation.org o Information on online support groups, education events, and online exercises including Yoga, Parkinson's exercises and more-LOTS of information on links to PD resources and online events o Virtual Support Group through Parkinson's Association of the [place]; next one is scheduled for Wednesday, March 08, 2021 at 2 pm. (These are typically scheduled for the 1st Wednesday of the month at 2 pm).  Visit website for details.  . Additional links for movement activities: o PWR! Moves Classes at [place] [facility] HAVE RESUMED!  Wednesdays 10 and 11 am.  Contact Amy Marriott, PT [email or web address] or [phone] if interested o Here is a link to the PWR!Moves classes on Zoom from [place] Parkinson's Foundation - Daily Mon-Sat at 10:00. Via Zoom, FREE and open to all.  There is also a link below via Facebook if you use that platform. - https://www.parkinsonsmi.org/mpf-programs/exercise-and-movement-activities - https://www.facebook.com/ParkinsonsMI.org/posts/pwr-moves-exercise-class-parkinson-wellness-recovery-online-with-angee-ludwa-pt-/10156827878021813/ o Parkinson's Wellness Recovery (PWR! Moves)  www.pwr4life.org - Info on the PWR! Virtual Experience:  You will have access to our expertise through self-assessment, guided plans that start with the PD-specific fundamentals, educational content, tips, Q&A with an expert, and a growing library of PD-specific pre-recorded and live exercise classes of varying types and intensity - both physical and cognitive! If that is not enough, we offer 1:1 wellness consultations (in-person or virtual) to personalize your PWR! Virtual Experience.  - Check out the PWR! Move of the month on the Parkinson Wellness Recovery website:     https://www.pwr4life.org/pwr-move-of-the-month-4/ o Parkinson Foundation Fitness Fridays:  - As part of the PD Health @ Home program, this free video series focuses each week on one aspect of fitness designed to support people living with Parkinson's.  These weekly videos highlight the Parkinson Foundation recent fitness guidelines for people with Parkinson's disease. -  www.parkinson.org/understanding-parkinsons/coronavirus/PD-health-at-home/Fitness-Fridays o Dance for PD website is offering free, live-stream classes throughout the week, as well as links to digital library of classes:  https://danceforparkinsons.org/ o Dance for Parkinson's Class:  Dance Project of [place].  Free offering for people with Parkinson's and care partners; virtual class.  o For more information, contact [phone] or email Magalli Morana at [email or web address] o Virtual dance and Pilates for Parkinson's classes: Click on the Community Tab> Parkinson's Movement Initiative Tab.  To register for classes and for more information, visit www.americandancefestival.org and click the "community" tab.     o [employer] Parkinson's Cycling Classes  - [facility] [employer]: 1pm on Fridays-Live classes at [employer] (Contact Beth McKinney at [email or web address] or [phone]) - [facility]: Virtual Classes Mondays and Thursdays (contact Priscilla at [email or web address] or [phone])  o [place] Steady Boxing - Three levels of classes are offered Tuesdays and Thursdays:  10:30 am,  12 noon & 1:45 pm at [facility] [facility].  - Active Stretching with Maria, New Class starting in March, on Fridays - To observe a class or for  more information, call [phone] or email [email or web address] . Well-Spring Solutions: o Online Caregiver Education Opportunities:  www.well-springsolutions.org/caregiver-education/caregiver-support-group.  You may also contact Jodi Kolada at  [email or web address] or [phone].   o Well-Spring Navigator:  Just1Navigator program, a free service to help individuals and families through the journey of determining care for older adults.  The "Navigator" is a social worker, Nicole Reynolds, who will speak with a prospective client and/or loved ones to provide an assessment of the situation and a set of recommendations for a personalized care plan -- all free of charge, and whether Well-Spring Solutions offers the needed service or not. If the need is not a service we provide, we are well-connected with reputable programs in town that we can refer you to.  www.well-springsolutions.org or to speak with the Navigator, call [phone].  

## 2021-05-10 DIAGNOSIS — K5730 Diverticulosis of large intestine without perforation or abscess without bleeding: Secondary | ICD-10-CM | POA: Diagnosis not present

## 2021-05-10 DIAGNOSIS — Z85038 Personal history of other malignant neoplasm of large intestine: Secondary | ICD-10-CM | POA: Diagnosis not present

## 2021-05-29 ENCOUNTER — Other Ambulatory Visit: Payer: Self-pay | Admitting: Neurology

## 2021-07-01 ENCOUNTER — Other Ambulatory Visit: Payer: Self-pay | Admitting: Urology

## 2021-07-28 DIAGNOSIS — H02403 Unspecified ptosis of bilateral eyelids: Secondary | ICD-10-CM | POA: Diagnosis not present

## 2021-07-28 DIAGNOSIS — H43813 Vitreous degeneration, bilateral: Secondary | ICD-10-CM | POA: Diagnosis not present

## 2021-07-28 DIAGNOSIS — H2513 Age-related nuclear cataract, bilateral: Secondary | ICD-10-CM | POA: Diagnosis not present

## 2021-07-28 DIAGNOSIS — H524 Presbyopia: Secondary | ICD-10-CM | POA: Diagnosis not present

## 2021-08-15 DIAGNOSIS — L820 Inflamed seborrheic keratosis: Secondary | ICD-10-CM | POA: Diagnosis not present

## 2021-08-15 DIAGNOSIS — D485 Neoplasm of uncertain behavior of skin: Secondary | ICD-10-CM | POA: Diagnosis not present

## 2021-08-15 DIAGNOSIS — L821 Other seborrheic keratosis: Secondary | ICD-10-CM | POA: Diagnosis not present

## 2021-08-15 DIAGNOSIS — R208 Other disturbances of skin sensation: Secondary | ICD-10-CM | POA: Diagnosis not present

## 2021-08-15 DIAGNOSIS — B351 Tinea unguium: Secondary | ICD-10-CM | POA: Diagnosis not present

## 2021-08-15 DIAGNOSIS — L570 Actinic keratosis: Secondary | ICD-10-CM | POA: Diagnosis not present

## 2021-08-15 DIAGNOSIS — L814 Other melanin hyperpigmentation: Secondary | ICD-10-CM | POA: Diagnosis not present

## 2021-08-15 DIAGNOSIS — L538 Other specified erythematous conditions: Secondary | ICD-10-CM | POA: Diagnosis not present

## 2021-08-21 DIAGNOSIS — N400 Enlarged prostate without lower urinary tract symptoms: Secondary | ICD-10-CM | POA: Diagnosis not present

## 2021-08-21 DIAGNOSIS — Z23 Encounter for immunization: Secondary | ICD-10-CM | POA: Diagnosis not present

## 2021-08-21 DIAGNOSIS — G20 Parkinson's disease: Secondary | ICD-10-CM | POA: Diagnosis not present

## 2021-08-21 DIAGNOSIS — Z85820 Personal history of malignant melanoma of skin: Secondary | ICD-10-CM | POA: Diagnosis not present

## 2021-08-21 DIAGNOSIS — I10 Essential (primary) hypertension: Secondary | ICD-10-CM | POA: Diagnosis not present

## 2021-08-21 DIAGNOSIS — M5459 Other low back pain: Secondary | ICD-10-CM | POA: Diagnosis not present

## 2021-08-27 ENCOUNTER — Other Ambulatory Visit: Payer: Self-pay | Admitting: Neurology

## 2021-08-27 DIAGNOSIS — G20 Parkinson's disease: Secondary | ICD-10-CM

## 2021-08-28 ENCOUNTER — Other Ambulatory Visit: Payer: Self-pay

## 2021-09-02 ENCOUNTER — Other Ambulatory Visit: Payer: Self-pay | Admitting: Neurology

## 2021-09-02 DIAGNOSIS — G20 Parkinson's disease: Secondary | ICD-10-CM

## 2021-09-04 ENCOUNTER — Other Ambulatory Visit: Payer: Self-pay

## 2021-09-22 DIAGNOSIS — N529 Male erectile dysfunction, unspecified: Secondary | ICD-10-CM | POA: Diagnosis not present

## 2021-09-22 DIAGNOSIS — R32 Unspecified urinary incontinence: Secondary | ICD-10-CM | POA: Diagnosis not present

## 2021-09-22 DIAGNOSIS — Z8249 Family history of ischemic heart disease and other diseases of the circulatory system: Secondary | ICD-10-CM | POA: Diagnosis not present

## 2021-09-22 DIAGNOSIS — I10 Essential (primary) hypertension: Secondary | ICD-10-CM | POA: Diagnosis not present

## 2021-09-22 DIAGNOSIS — M1990 Unspecified osteoarthritis, unspecified site: Secondary | ICD-10-CM | POA: Diagnosis not present

## 2021-09-22 DIAGNOSIS — I2510 Atherosclerotic heart disease of native coronary artery without angina pectoris: Secondary | ICD-10-CM | POA: Diagnosis not present

## 2021-09-22 DIAGNOSIS — G20 Parkinson's disease: Secondary | ICD-10-CM | POA: Diagnosis not present

## 2021-09-22 DIAGNOSIS — Z85828 Personal history of other malignant neoplasm of skin: Secondary | ICD-10-CM | POA: Diagnosis not present

## 2021-09-22 DIAGNOSIS — N400 Enlarged prostate without lower urinary tract symptoms: Secondary | ICD-10-CM | POA: Diagnosis not present

## 2021-09-22 DIAGNOSIS — Z85038 Personal history of other malignant neoplasm of large intestine: Secondary | ICD-10-CM | POA: Diagnosis not present

## 2021-09-22 DIAGNOSIS — I700 Atherosclerosis of aorta: Secondary | ICD-10-CM | POA: Diagnosis not present

## 2021-09-22 DIAGNOSIS — E785 Hyperlipidemia, unspecified: Secondary | ICD-10-CM | POA: Diagnosis not present

## 2021-09-26 NOTE — Progress Notes (Signed)
Assessment/Plan:   1.  Parkinsons Disease  -Continue carbidopa/levodopa 25/100, 1 tablet 3 times daily.  Discussed again about importance of taking tid and having regular schedule.   -Congratulated him for all the exercise.  2.  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  Notes that Parkinson's slightly increases risk for melanoma.  4.  History of colon cancer  -Follows with oncology   Subjective:   Kevin Mathews was seen today in follow up for Parkinsons disease.  Pt with wife who supplements hx. My previous records were reviewed prior to todays visit as well as outside records available to me. Pt denies falls.  Pt denies lightheadedness, near syncope.  No hallucinations.  Mood has been good.  Reports he is exercising - doing rsb and dancing.    Current prescribed movement disorder medications: Carbidopa/levodopa 25/100, 1 tablet 3 times per day   ALLERGIES:  No Known Allergies  CURRENT MEDICATIONS:  Outpatient Encounter Medications as of 09/28/2021  Medication Sig   atorvastatin (LIPITOR) 10 MG tablet Take 10 mg by mouth 4 (four) times a week.    b complex vitamins capsule Take 1 capsule by mouth daily.   carbidopa-levodopa (SINEMET IR) 25-100 MG tablet TAKE 1 TABLET BY MOUTH THREE TIMES DAILY   Cholecalciferol (CVS VITAMIN D3 PO) Take 1 tablet by mouth daily.   dexlansoprazole (DEXILANT) 60 MG capsule Take 60 mg by mouth daily as needed (acid reflux).   doxazosin (CARDURA) 4 MG tablet Take 4 mg by mouth every evening.    finasteride (PROSCAR) 5 MG tablet Take 1 tablet by mouth once daily   hydrochlorothiazide (MICROZIDE) 12.5 MG capsule Take 12.5 mg by mouth every evening.    ibuprofen (ADVIL,MOTRIN) 200 MG tablet Take 400 mg by mouth 2 (two) times daily as needed for headache or mild pain.    Multiple Vitamins-Minerals (ADULT GUMMY PO) Take 2 tablets by  mouth daily.    No facility-administered encounter medications on file as of 09/28/2021.    Objective:   PHYSICAL EXAMINATION:    VITALS:   Vitals:   09/28/21 1502  BP: 125/68  Pulse: 65  SpO2: 98%  Weight: 185 lb (83.9 kg)  Height: 5\' 6"  (1.676 m)     GEN:  The patient appears stated age and is in NAD. HEENT:  Normocephalic, atraumatic.  The mucous membranes are moist. The superficial temporal arteries are without ropiness or tenderness. CV:  RRR Lungs:  CTAB Neck/HEME:  There are no carotid bruits bilaterally.  Neurological examination:  Orientation: The patient is alert and oriented x3. Cranial nerves: There is good facial symmetry with facial hypomimia. The speech is fluent and clear. Soft palate rises symmetrically and there is no tongue deviation. Hearing is intact to conversational tone. Sensation: Sensation is intact to light touch throughout Motor: Strength is at least antigravity x4.  Movement examination: Tone: There is mod increased tone in the LUE Abnormal movements: there is LUE rest tremor  Coordination:  There is no decremation with RAM's Gait and Station: The patient has no difficulty arising out of a deep-seated chair without the use of the hands. The patient's stride length is good with LUE rest tremor.     I have reviewed and interpreted the following labs independently    Chemistry      Component Value Date/Time   NA 140 02/27/2021 0949   K  4.3 02/27/2021 0949   CL 104 02/27/2021 0949   CO2 27 02/27/2021 0949   BUN 16 02/27/2021 0949   CREATININE 1.11 02/27/2021 0949      Component Value Date/Time   CALCIUM 9.7 02/27/2021 0949   ALKPHOS 100 02/27/2021 0949   AST 18 02/27/2021 0949   ALT 8 02/27/2021 0949   BILITOT 0.7 02/27/2021 0949       Lab Results  Component Value Date   WBC 5.4 02/27/2021   HGB 15.1 02/27/2021   HCT 46.4 02/27/2021   MCV 91.7 02/27/2021   PLT 201 02/27/2021    Lab Results  Component Value Date   TSH  3.74 07/22/2018     Total time spent on today's visit was 30 minutes, including both face-to-face time and nonface-to-face time.  Time included that spent on review of records (prior notes available to me/labs/imaging if pertinent), discussing treatment and goals, answering patient's questions and coordinating care.  Cc:  Lavone Orn, MD

## 2021-09-28 ENCOUNTER — Ambulatory Visit: Payer: Medicare HMO | Admitting: Neurology

## 2021-09-28 ENCOUNTER — Encounter: Payer: Self-pay | Admitting: Neurology

## 2021-09-28 ENCOUNTER — Other Ambulatory Visit: Payer: Self-pay

## 2021-09-28 DIAGNOSIS — G20 Parkinson's disease: Secondary | ICD-10-CM | POA: Diagnosis not present

## 2021-09-28 MED ORDER — CARBIDOPA-LEVODOPA 25-100 MG PO TABS: ORAL_TABLET | ORAL | 1 refills | Status: DC

## 2021-09-28 NOTE — Patient Instructions (Addendum)
Increase carbidopa/levodopa 25/100, 2 at 7am, 2 at 11am, 1 at [employer] for Power over Parkinson's Group October 2022  [place] over [employer] Group :   Power Over Parkinson's Patient Education Group will be Wednesday, October 12th-*Hybrid meting*- in person at [facility] location and via [facility] at 2:00 pm.   Upcoming Power over [employer] Meetings:  2nd Wednesdays of the month at 2 pm:  October 12th, November 9th Contact Amy Marriott at [email or web address] if interested in participating in this online group Parkinson's Care Partners Group:    3rd Mondays, Contact Misty Paladino Atypical Parkinsonian Patient Group:   4th Wednesdays, Contact Misty Paladino If you are interested in participating in these online groups with Misty, please contact her directly for how to join those meetings.  Her contact information is [email or web address].   [place]:  www.[email or web address] PD Health at Home continues:  Mindfulness Mondays, Expert Briefing Tuesdays, Wellness Wednesdays, Take Time Thursdays, Fitness Fridays -Listings for June 2022 are on the website Upcoming Webinar:  Expert Briefing:  Let's Talk about Dementia.  Wednesday, November 2nd  at 1 pm. Upcoming Webinar:  Understanding Gene and Cell-Based Therapies in Parkinson's.  Wednesday, October 5th at 1 pm Register for expert briefings (webinars) at [email or web address]  Please check out their website to sign up for emails and see their full online offerings  [place]:  [email or web address]  Upcoming Webinar:   Deep Brain Stimulation:  Is it Right for me or my Loved One?  Thursday, October 20th at 12 noon Check out additional information on their website to see their full online offerings  [place]:  www.[email or web address] Upcoming Webinar:  Living With and  Managing Parkinson's Disease Psychosis.  Tuesday, October 18th at 3 pm.  Live Q & A:  Parkinson's Disease Psychosis.  Friday, October 28th at 3 pm. Care Partner Monthly Meetup.  With Robin Searing Phinney.  First Tuesday of each month, 2 pm Joy Breaks:  First Wednesday of each month, 2-3 pm. There will be art, doodling, making, crafting, listening, laughing, stories, and everything in between. No art experience necessary. No supplies required. Just show up for joy!  Register on their website. Check out additional information to Live Well Today on their website  Parkinson and Movement Disorders (PMD) Alliance:  www.pmdalliance.org NeuroLife Online:  Online Education Events Sign up for emails, which are sent weekly to give you updates on programming and online offerings  Parkinson's Association of the [place]:  www.parkinsonassociation.org Information on online support groups, education events, and online exercises including Yoga, Parkinson's exercises and more-LOTS of information on links to PD resources and online events Virtual Support Group through Parkinson's Association of the [place]; next one is scheduled for Wednesday, October 5th at 2 pm.  (These are typically scheduled for the 1st Wednesday of the month at 2 pm).  Visit website for details.  Additional links for movement activities: Parkinson's DRUMMING Classes/Music Therapy with Doylene Canning:  This is a returning class and it's FREE!  2nd Mondays, continuing October 10th.  Contact *Misty Taylor-Paladino at [email or web address] or Doylene Canning at [phone] or [email or web address]  PWR! Moves Classes at [place] RESUMED!  Wednesdays 10 and 11 am.  Contact Amy Marriott, PT [email or web address] if interested Here is a link to the PWR!Moves classes on Zoom from [place] - Daily Mon-Sat at 10:00. Via Zoom, FREE and open to all.  There is also a link below via  Facebook if you  use that platform. [email or web address] Parkinson's Wellness Recovery (PWR! Moves)  www.pwr4life.org Info on the PWR! Virtual Experience:  You will have access to our expertise through self-assessment, guided plans that start with the PD-specific fundamentals, educational content, tips, Q&A with an expert, and a growing Art therapist of PD-specific pre-recorded and live exercise classes of varying types and intensity - both physical and cognitive! If that is not enough, we offer 1:1 wellness consultations (in-person or virtual) to personalize your PWR! Research scientist (medical).  [place] Fridays:  As part of the PD Health @ Home program, this free video series focuses each week on one aspect of fitness designed to support people living with Parkinson's.  These weekly videos highlight the [place] recent fitness guidelines for people with Parkinson's disease.  [employer].dk Dance for PD website is offering free, live-stream classes throughout the week, as well as links to [employer] of classes:  [email or web address]://danceforparkinsons.org/ Dance for Parkinson's Class:  [place].  Free offering for people with Parkinson's and care partners; virtual class.  For more information, contact [phone] or email Ruffin Frederick at [email or web address] Virtual dance and Pilates for Parkinson's classes: Click on the Community Tab> Parkinson's Movement Initiative Tab.  To register for classes and for more information, visit [email or web address] and click the "community" tab.  [employer] Parkinson's Cycling Classes  [employer]: 1pm on Fridays-Live classes at [employer] ([employer] at [email or web address] or [phone]) [facility] [employer]: Virtual Classes Mondays and Thursdays Jeanette Caprice classes Tuesday, Wednesday and Thursday (contact [place] at [email or web address]  or [phone])  [place] Varied levels of classes are offered Mondays, Tuesdays and Thursdays at [employer].  To observe a class or for more information, call [phone] or email [email or web address] Well-Spring Solutions: Online Caregiver Education Opportunities:  www.well-springsolutions.org/caregiver-education/caregiver-support-group.  You may also contact Vickki Muff at [email or web address] or [phone].   Powerful Tools for Caregivers:  6-week program beginning Thursday, October 13th.  This six-week educational series designed to provide family caregivers with practical tools to care for themselves while caring for a loved one Unlocking Dementia through [facility], Humor, and Understanding:  5-week series beginning September 7th Well-Spring Navigator:  02-07-2000 program, a free service to help individuals and families through the journey of determining care for older adults.  The "Navigator" is a [employer], Education officer, museum, who will speak with a prospective client and/or loved ones to provide an assessment of the situation and a set of recommendations for a personalized care plan -- all free of charge, and whether Well-Spring Solutions offers the needed service or not. If the need is not a service we provide, we are well-connected with reputable programs in town that we can refer you to.  www.well-springsolutions.org or to speak with the Navigator, call [phone].

## 2021-10-03 ENCOUNTER — Other Ambulatory Visit: Payer: Self-pay | Admitting: Urology

## 2021-11-14 DIAGNOSIS — L570 Actinic keratosis: Secondary | ICD-10-CM | POA: Diagnosis not present

## 2021-12-25 ENCOUNTER — Other Ambulatory Visit: Payer: Self-pay | Admitting: Neurology

## 2021-12-25 ENCOUNTER — Other Ambulatory Visit: Payer: Self-pay | Admitting: Urology

## 2021-12-25 DIAGNOSIS — G20 Parkinson's disease: Secondary | ICD-10-CM

## 2021-12-26 ENCOUNTER — Other Ambulatory Visit: Payer: Self-pay

## 2021-12-28 ENCOUNTER — Telehealth: Payer: Self-pay | Admitting: Neurology

## 2021-12-28 NOTE — Telephone Encounter (Signed)
Patients wife said insurance wont approve the RX that was sent on 12/26/21 b/c it doesn't show the increase to 5 a day.

## 2021-12-29 ENCOUNTER — Other Ambulatory Visit: Payer: Self-pay

## 2021-12-29 DIAGNOSIS — G20 Parkinson's disease: Secondary | ICD-10-CM

## 2021-12-29 MED ORDER — CARBIDOPA-LEVODOPA 25-100 MG PO TABS: ORAL_TABLET | ORAL | 0 refills | Status: DC

## 2021-12-29 NOTE — Telephone Encounter (Signed)
Changed prescription and sent in to pharmacy for patient . Called and let patient know

## 2022-01-22 DIAGNOSIS — L570 Actinic keratosis: Secondary | ICD-10-CM | POA: Diagnosis not present

## 2022-02-23 DIAGNOSIS — Z85038 Personal history of other malignant neoplasm of large intestine: Secondary | ICD-10-CM | POA: Diagnosis not present

## 2022-02-23 DIAGNOSIS — I700 Atherosclerosis of aorta: Secondary | ICD-10-CM | POA: Diagnosis not present

## 2022-02-23 DIAGNOSIS — Z0000 Encounter for general adult medical examination without abnormal findings: Secondary | ICD-10-CM | POA: Diagnosis not present

## 2022-02-23 DIAGNOSIS — R7989 Other specified abnormal findings of blood chemistry: Secondary | ICD-10-CM | POA: Diagnosis not present

## 2022-02-23 DIAGNOSIS — E7800 Pure hypercholesterolemia, unspecified: Secondary | ICD-10-CM | POA: Diagnosis not present

## 2022-02-23 DIAGNOSIS — N400 Enlarged prostate without lower urinary tract symptoms: Secondary | ICD-10-CM | POA: Diagnosis not present

## 2022-02-23 DIAGNOSIS — G20 Parkinson's disease: Secondary | ICD-10-CM | POA: Diagnosis not present

## 2022-02-23 DIAGNOSIS — Z85820 Personal history of malignant melanoma of skin: Secondary | ICD-10-CM | POA: Diagnosis not present

## 2022-02-23 DIAGNOSIS — Z1389 Encounter for screening for other disorder: Secondary | ICD-10-CM | POA: Diagnosis not present

## 2022-02-23 DIAGNOSIS — I10 Essential (primary) hypertension: Secondary | ICD-10-CM | POA: Diagnosis not present

## 2022-02-25 NOTE — Progress Notes (Addendum)
?[place]   ?[phone] [phone]   ?Clinic Follow up Note  ? ?Patient Care Team: ?Lavone Orn, MD as PCP - General (Internal Medicine) ?Coralie Keens, MD as Consulting Physician (General Surgery) ?Truitt Merle, MD as Consulting Physician (Hematology) ?Ludwig Clarks, DO as Consulting Physician (Neurology) ?02/27/2022 ? ?CHIEF COMPLAINT: Follow up stage I colon cancer  ? 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 ?  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 Colonoscopy 04/15/17 ?IMPRESSION ?- One 5 mm polyp in the proximal descending colon, removed with a cold snare. Resected ?and retrieved. ?- Tumor in the proximal ascending colon. Biopsied. ?- The examination was otherwise normal. ? ?  ?04/15/2017 Pathology Results  ? Diagnosis 04/15/17 by Dr. Wynetta Emery  ?1. Colon, biopsy, at junction of cecum and ascending ?- INVASIVE ADENOCARCINOMA ?2. Colon, polyp(s), descending ?- BENIGN COLORECTAL MUCOSA. ?- ASSOCIATED BENIGN LYMPHOID AGGREGATES. ?- MELANOSIS COLI PRESENT. ?- NO DYSPLASIA OR MALIGNANCY IDENTIFIED. ?Microscopic Comment ?1. Dr. Lyndon Code has seen the first specimen (cecal and ascending colon biopsy) in consultation with agreement. The findings ?are called to [facility] who is taking the result for Dr. Wynetta Emery on 04/16/2017. ?  ?04/15/2017 Initial Diagnosis  ? 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  ?04/25/2017 Imaging  ? CT CAP W Contrast 04/25/17 ?IMPRESSION: ?Moderate size sliding-type hiatal hernia. ?Coronary artery calcifications are noted suggesting coronary artery ?disease. ?Scarring is noted in both lungs. ?4 mm nodule is noted in left lung  apex. No follow-up needed if ?patient is low-risk. Non-contrast chest CT can be considered in 12 ?months if patient is high-risk. This recommendation follows the ?consensus statement: Guidelines for Management of Incidental ?Pulmonary Nodules Detected on CT Images: From the Fleischner Society ?2017; Radiology 2017; 536:644-034. ?Small rounded low density seen peripherally in right hepatic lobe ?which most likely represent cyst, but metastatic disease cannot be ?excluded given the history of colonic malignancy. Continued ?follow-up is recommended. ?16 mm soft tissue density seen in the cecum which may represent ?neoplasm or malignancy, as noted on colonoscopy. ?Aortic atherosclerosis. ?Stable prostatic enlargement. ?  ?06/12/2017 Surgery  ? LAPAROSCOPIC ASSISTED PARTIAL COLECTOMY by Dr. Ninfa Linden  ? ? ?The [facility] port was placed to the opening and insufflation of the abdomen was begun. 64 [name] reports then placed the patient's upper midline under direct vision. I could easily identify the cecum and ascending colon. There was a tattoo where the colonoscopy biopsy was performed indicating the level of the cancer. I saw no other intra-abdominal pathology. The liver appeared normal. I next mobilized the right colon along the white line of [place] free of the appendix is well with harmonic scalpel. I then took down the hepatic flexure freed gallbladder off of the colon with the Harmonic scalpel as well. The entire right colon became easily mobile and was able to easily move past the midline. At this point I converted to the open portion of procedure. I removed all ports. I created an incision between the 2 five-minute reports with scalpel. I then took this down through the fascia with electrocautery. The peritoneum was then opened entirely to the incision. I was then able to easily eviscerate the cecum and right colon and distal ileum. I transected the  distal ileum with the GIA 75 stapler. I then transected the transverse  colon proximally with the GIA 75 staplers well. I then took down the mesentery with the LigaSure cautery device. I easily palpate the mass inside the colon. The specimen was sent to pathology for evaluation. I then reapproximated the small bowel to the proximal colon in a side-to-side fashion with interrupted silk sutures. I performed a colostomy and enterotomy with the cautery and performed a side-to-side anastomosis with a single firing the GIA 75 stapler. The opening was then closed with TA 60 stapler. I evaluated stable on hemostasis appeared to be achieved prior to closing with a TA 60. I then reinforced stapler with interrupted silk sutures and closed the mesenteric defect silk sutures as well. Again the anastomosis appeared pink and well-perfused.  I then placed colon back into the abdomen. I then irrigated the abdomen with several liters normal saline. Hemostasis again appeared to be achieved. ?  ?06/12/2017 Pathology Results  ? Diagnosis 06/12/17 ?Colon, segmental resection for tumor, right colon ?- ADENOCARCINOMA, MODERATELY DIFFERENTIATED ?- NO CARCINOMA IDENTIFIED IN NINETEEN LYMPH NODES (0/19) ?- MARGINS UNINVOLVED BY CARCINOMA ?- BENIGN APPENDIX WITH LUMINAL FIBROSIS ?- SEE ONCOLOGY TABLE BELOW ?  ?02/23/2020 Imaging  ? MRI Abdomen  ?IMPRESSION: ?1. 7 mm lesion identified in the inferior right liver along the ?gallbladder fossa, corresponding to the abnormality seen on the ?recent CT scan. Lesion likely represents a flash filling hemangioma. ?Repeat MRI or CT in 3-6 months recommended to ensure stability. ?2. Bilateral simple renal cysts with possible 15 mm Bosniak II cyst ?interpolar right kidney (versus 2 adjacent tiny simple cyst). ?3. Small to moderate hiatal hernia. ?4. Hepatic steatosis. ?  ? ? ?CURRENT THERAPY: Surveillance  ? ?INTERVAL HISTORY: Kevin Mathews returns for follow-up as scheduled, last seen by Dr. Burr Medico 02/27/2021. He reportedly underwent a colonoscopy last year which "looked good."  He  was recommended to return to GI in 5 years.  He is doing well overall.  He continues to have intermittent constipation, managed with Metamucil.  Denies abdominal pain/bloating, nausea/vomiting, rectal bleeding, unintentional weight loss, significant fatigue, recent infection, new dyspnea, or any other changes in his health. ? ?All other systems were reviewed with the patient and are negative. ? ?MEDICAL HISTORY:  ?Past Medical History:  ?Diagnosis Date  ? Arthritis   ? BPH (benign prostatic hyperplasia)   ? Dyspnea   ? with excertion  ? Frequent urination   ? GERD (gastroesophageal reflux disease)   ? occasional  ? History of kidney stones   ? passed  ? Hypertension   ? 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 ? ?SURGICAL HISTORY: ?Past Surgical History:  ?Procedure Laterality Date  ? COLONOSCOPY WITH PROPOFOL N/A 04/15/2017  ? Procedure: COLONOSCOPY WITH PROPOFOL;  Surgeon: Garlan Fair, MD;  Location: [facility];  Service: Endoscopy;  Laterality: N/A;  ? LAPAROSCOPIC PARTIAL COLECTOMY N/A 06/12/2017  ? Procedure: LAPAROSCOPIC ASSISTED PARTIAL COLECTOMY;  Surgeon: Coralie Keens, MD;  Location: [facility];  Service: General;  Laterality: N/A;  ? [place]  ? TOTAL SHOULDER ARTHROPLASTY Left 07/01/2013  ? Procedure: TOTAL SHOULDER ARTHROPLASTY;  Surgeon: Ninetta Lights, MD;  Location: [facility];  Service: Orthopedics;  Laterality: Left;  with interscalene block  ? TOTAL SHOULDER ARTHROPLASTY Right 03/18/2018  ? Procedure: RIGHT TOTAL SHOULDER ARTHROPLASTY, BICEPS TENODESIS;  Surgeon: Renette Butters, MD;  Location: [place];  Service: Orthopedics;  Laterality: Right;  ? ? ?  I have reviewed the social history and family history with the patient and they are unchanged from previous note. ? ?ALLERGIES:  has No Known Allergies. ? ?MEDICATIONS:  ?Current Outpatient Medications  ?Medication Sig Dispense Refill  ? atorvastatin (LIPITOR) 10 MG tablet Take 10 mg by mouth 4  (four) times a week.     ? b complex vitamins capsule Take 1 capsule by mouth daily.    ? carbidopa-levodopa (SINEMET IR) 25-100 MG tablet 2 at 7am, 2 at 11am, 1 at 4pm 450 tablet 0  ? Cholecalciferol ([facility]

## 2022-02-27 ENCOUNTER — Encounter: Payer: Self-pay | Admitting: Nurse Practitioner

## 2022-02-27 ENCOUNTER — Telehealth: Payer: Self-pay

## 2022-02-27 ENCOUNTER — Other Ambulatory Visit: Payer: Self-pay

## 2022-02-27 ENCOUNTER — Inpatient Hospital Stay: Payer: Medicare HMO

## 2022-02-27 ENCOUNTER — Inpatient Hospital Stay: Payer: Medicare HMO | Attending: Nurse Practitioner | Admitting: Nurse Practitioner

## 2022-02-27 VITALS — BP 134/67 | HR 50 | Temp 97.8°F | Resp 18 | Ht 66.0 in | Wt 185.7 lb

## 2022-02-27 DIAGNOSIS — C182 Malignant neoplasm of ascending colon: Secondary | ICD-10-CM

## 2022-02-27 DIAGNOSIS — Z85038 Personal history of other malignant neoplasm of large intestine: Secondary | ICD-10-CM | POA: Diagnosis not present

## 2022-02-27 DIAGNOSIS — Z79899 Other long term (current) drug therapy: Secondary | ICD-10-CM | POA: Diagnosis not present

## 2022-02-27 DIAGNOSIS — Z96611 Presence of right artificial shoulder joint: Secondary | ICD-10-CM | POA: Insufficient documentation

## 2022-02-27 DIAGNOSIS — I10 Essential (primary) hypertension: Secondary | ICD-10-CM | POA: Diagnosis not present

## 2022-02-27 DIAGNOSIS — Z85820 Personal history of malignant melanoma of skin: Secondary | ICD-10-CM | POA: Insufficient documentation

## 2022-02-27 DIAGNOSIS — G20 Parkinson's disease: Secondary | ICD-10-CM | POA: Insufficient documentation

## 2022-02-27 LAB — CBC WITH DIFFERENTIAL (CANCER CENTER ONLY)
Abs Immature Granulocytes: 0.06 10*3/uL (ref 0.00–0.07)
Basophils Absolute: 0 10*3/uL (ref 0.0–0.1)
Basophils Relative: 1 %
Eosinophils Absolute: 0.3 10*3/uL (ref 0.0–0.5)
Eosinophils Relative: 5 %
HCT: 42 % (ref 39.0–52.0)
Hemoglobin: 13.8 g/dL (ref 13.0–17.0)
Immature Granulocytes: 1 %
Lymphocytes Relative: 20 %
Lymphs Abs: 1.1 10*3/uL (ref 0.7–4.0)
MCH: 30.5 pg (ref 26.0–34.0)
MCHC: 32.9 g/dL (ref 30.0–36.0)
MCV: 92.9 fL (ref 80.0–100.0)
Monocytes Absolute: 0.5 10*3/uL (ref 0.1–1.0)
Monocytes Relative: 9 %
Neutro Abs: 3.4 10*3/uL (ref 1.7–7.7)
Neutrophils Relative %: 64 %
Platelet Count: 174 10*3/uL (ref 150–400)
RBC: 4.52 MIL/uL (ref 4.22–5.81)
RDW: 13.3 % (ref 11.5–15.5)
WBC Count: 5.3 10*3/uL (ref 4.0–10.5)
nRBC: 0 % (ref 0.0–0.2)

## 2022-02-27 LAB — CMP (CANCER CENTER ONLY)
ALT: <5 U/L (ref 0–44)
AST: 17 U/L (ref 15–41)
Albumin: 4 g/dL (ref 3.5–5.0)
Alkaline Phosphatase: 87 U/L (ref 38–126)
Anion gap: 4 — ABNORMAL LOW (ref 5–15)
BUN: 23 mg/dL (ref 8–23)
CO2: 27 mmol/L (ref 22–32)
Calcium: 9.4 mg/dL (ref 8.9–10.3)
Chloride: 109 mmol/L (ref 98–111)
Creatinine: 0.99 mg/dL (ref 0.61–1.24)
GFR, Estimated: >60 mL/min (ref >60)
Glucose, Bld: 71 mg/dL (ref 70–99)
Potassium: 3.8 mmol/L (ref 3.5–5.1)
Sodium: 140 mmol/L (ref 135–145)
Total Bilirubin: 0.5 mg/dL (ref 0.3–1.2)
Total Protein: 6.9 g/dL (ref 6.5–8.1)

## 2022-02-27 LAB — CEA (IN HOUSE-CHCC): CEA (CHCC-In House): 1.82 ng/mL (ref 0.00–5.00)

## 2022-02-27 NOTE — Telephone Encounter (Signed)
This nurse left a message for [facility] Gastro[facility]ology requesting results from this patients last colonoscopy to be faxed over to our office.  No concerns at this time.   ?

## 2022-04-03 DIAGNOSIS — L570 Actinic keratosis: Secondary | ICD-10-CM | POA: Diagnosis not present

## 2022-04-05 ENCOUNTER — Other Ambulatory Visit: Payer: Self-pay | Admitting: Urology

## 2022-04-06 ENCOUNTER — Telehealth: Payer: Self-pay | Admitting: Neurology

## 2022-04-06 NOTE — Telephone Encounter (Signed)
Pt called an informed that the pharmacy filled the wrong script and that they are working on filling the right one, she stated that at the next appointment they may change pharmacies because this is the second time this has happened  ?

## 2022-04-06 NOTE — Telephone Encounter (Signed)
Patient's wife picked up an Rx for 3 pills a day instead of 5 a day for carbidopa levodopa. ? ?She is requesting the remainder of the Rx to be sent in please. ? ?[facility] [address] ?

## 2022-04-12 DIAGNOSIS — N401 Enlarged prostate with lower urinary tract symptoms: Secondary | ICD-10-CM | POA: Diagnosis not present

## 2022-04-12 DIAGNOSIS — R3914 Feeling of incomplete bladder emptying: Secondary | ICD-10-CM | POA: Diagnosis not present

## 2022-04-12 DIAGNOSIS — R310 Gross hematuria: Secondary | ICD-10-CM | POA: Diagnosis not present

## 2022-04-24 NOTE — Progress Notes (Signed)
Assessment/Plan:   1.  Parkinsons Disease  -Continue carbidopa/levodopa 25/100, 2 at 9am, 2 at 1pm, 1 at 5pm (adjusted the times)  -Congratulated him for all the exercise.  2.  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  Notes that Parkinson's slightly increases risk for melanoma.  4.  History of colon cancer  -Follows with oncology   Subjective:   Kevin Mathews was seen today in follow up for Parkinsons disease.  Pt with wife who supplements hx. My previous records were reviewed prior to todays visit as well as outside records available to me. Pt denies falls.  Pt denies lightheadedness, near syncope.  No hallucinations.  Mood has been fair.  Still doing rsb.  He is exercising 5 days per week.    He admits he isn't taking med at regular times.  He isn't waking up until 10 am and then may spread the others out, with the last being from 7pm to 10pm.  Current prescribed movement disorder medications: Carbidopa/levodopa 25/100, 2 at 7, 2 at 11am, 1 at 4pm   ALLERGIES:  No Known Allergies  CURRENT MEDICATIONS:  Outpatient Encounter Medications as of 05/01/2022  Medication Sig   atorvastatin (LIPITOR) 10 MG tablet Take 10 mg by mouth 4 (four) times a week.    b complex vitamins capsule Take 1 capsule by mouth daily.   carbidopa-levodopa (SINEMET IR) 25-100 MG tablet 2 at 7am, 2 at 11am, 1 at 4pm   Cholecalciferol ([facility] VITAMIN D3 PO) Take 1 tablet by mouth daily.   dexlansoprazole (DEXILANT) 60 MG capsule Take 60 mg by mouth daily as needed (acid reflux).   doxazosin (CARDURA) 4 MG tablet Take 4 mg by mouth every evening.    finasteride (PROSCAR) 5 MG tablet Take 1 tablet by mouth once daily   hydrochlorothiazide (MICROZIDE) 12.5 MG capsule Take 12.5 mg by mouth every evening.    ibuprofen (ADVIL,MOTRIN) 200 MG tablet Take 400 mg by mouth 2 (two) times daily as needed  for headache or mild pain.    Multiple Vitamins-Minerals (ADULT GUMMY PO) Take 2 tablets by mouth daily.    No facility-administered encounter medications on file as of 05/01/2022.    Objective:   PHYSICAL EXAMINATION:    VITALS:   Vitals:   05/01/22 1309  BP: 126/78  Pulse: (!) 58  SpO2: 99%  Weight: 183 lb (83 kg)  Height: '5\' 6"'$  (1.676 m)      GEN:  The patient appears stated age and is in NAD. HEENT:  Normocephalic, atraumatic.  The mucous membranes are moist. The superficial temporal arteries are without ropiness or tenderness. CV:  brady.  regular Lungs:  CTAB Neck/HEME:  There are no carotid bruits bilaterally.  Neurological examination:  Orientation: The patient is alert and oriented x3. Cranial nerves: There is good facial symmetry with facial hypomimia. The speech is fluent and clear. Soft palate rises symmetrically and there is no tongue deviation. Hearing is intact to conversational tone. Sensation: Sensation is intact to light touch throughout Motor: Strength is at least antigravity x4.  Movement examination: Tone: There is nl tone in the UE/LE Abnormal movements: there is LUE rest tremor and RLE foot tremor (rare) Coordination:  There is no decremation with RAM's Gait and Station: The patient has no difficulty arising out of a deep-seated chair without the use of the hands. The patient's stride length  is good with LUE rest tremor  (same)  I have reviewed and interpreted the following labs independently    Chemistry      Component Value Date/Time   NA 140 02/27/2022 1026   K 3.8 02/27/2022 1026   CL 109 02/27/2022 1026   CO2 27 02/27/2022 1026   BUN 23 02/27/2022 1026   CREATININE 0.99 02/27/2022 1026      Component Value Date/Time   CALCIUM 9.4 02/27/2022 1026   ALKPHOS 87 02/27/2022 1026   AST 17 02/27/2022 1026   ALT <5 02/27/2022 1026   BILITOT 0.5 02/27/2022 1026       Lab Results  Component Value Date   WBC 5.3 02/27/2022   HGB 13.8  02/27/2022   HCT 42.0 02/27/2022   MCV 92.9 02/27/2022   PLT 174 02/27/2022    Lab Results  Component Value Date   TSH 3.74 07/22/2018      Cc:  Lavone Orn, MD

## 2022-05-01 ENCOUNTER — Encounter: Payer: Self-pay | Admitting: Neurology

## 2022-05-01 ENCOUNTER — Ambulatory Visit: Payer: Medicare HMO | Admitting: Neurology

## 2022-05-01 DIAGNOSIS — G20 Parkinson's disease: Secondary | ICD-10-CM

## 2022-05-01 MED ORDER — CARBIDOPA-LEVODOPA 25-100 MG PO TABS: ORAL_TABLET | ORAL | 1 refills | Status: DC

## 2022-05-01 NOTE — Patient Instructions (Addendum)
Take carbidopa/levodopa 25/100, 2 at 9am, 2 at 1pm, 1 at 5pm (adjusted the times).  [facility]  Local and Online Resources for Power over Parkinson's Group May 2023  LOCAL [place] PARKINSON'S GROUPS  Power over Parkinson's Group:   Power Over Parkinson's Patient Education Group will be Wednesday, May 10th-*Hybrid meting*- in person at [place] location and via [facility] at 2:00 pm.   Upcoming Power over Parkinson's Meetings:  2nd Wednesdays of the month at 2 pm:  May 10th, June 14th, July 12th Contact Amy Marriott at [email or web address] if interested in participating in this group Parkinson's Care Partners Group:    3rd Mondays, Contact Misty Paladino Atypical Parkinsonian Patient Group:   4th Wednesdays, Box If you are interested in participating in these groups with Misty, please contact her directly for how to join those meetings.  Her contact information is [email or web address].    LOCAL EVENTS AND NEW OFFERINGS Moving Day [place]:  Saturday, May 6th, 9:30 am at [place], [place], [place]. Participate in Moving Day as a way to "honor loved ones, raise funds, fight Parkinson's disease, and celebrate movement."  Register today at [email or web address]!  Play [place]!  Join [place] for home game for a fun evening to bring awareness of Parkinson's and raise funds for our Movement Disorder Funds. Rescheduled to May 11th  6:30 pm [place]. To purchase tickets:  [email or web address] Parkinson's T-shirts for sale!  Designed by a local group member, with funds going to [place].  $25.00  Investment banker, corporate to purchase  [employer]! Moves [address] Instructor-Led Class offering at [employer]!  Wednesdays 1-2 pm, starting April 12th.   Contact Bryson Dames, Acupuncturist at [employer].   [email or web address] [email or web address]  [place]:  www.[email or web address] PD Health at Home continues:  Mindfulness Mondays, Wellness Wednesdays, Fitness Fridays  Upcoming Education:  Understanding Gene and Cell-Based Therapies in Parkinson's.  Wednesday, May 10th at 1:00 pm Additional Education offerings virtually through their website-upcoming topics include Palliative Care/Hospice and PD, Sleep and PD Register for expert briefings (webinars) at [email or web address] Please check out their website to sign up for emails and see their full online offerings   [place]:  [email or web address]  Third Thursday Webinars:  On the third Thursday of every month at 12 p.m. ET, join our free live webinars to learn about various aspects of living with Parkinson's disease and our work to speed medical breakthroughs. Upcoming Webinar: Get Moving: Exercising for a Healthy Brain.  Thursday, May 18th  at  12 noon. Check out additional information on their website to see their full online offerings  [facility]:  [email or web address] Upcoming Webinar:   Stay tuned Webinar Series:  Living with Parkinson's Meetup.   Third Thursdays each month, 3 pm Care Partner Monthly Meetup.  With Robin Searing [place].  First Tuesday of each month, 2 pm Check out additional information to Live Well Today on their website  Parkinson and Movement Disorders (PMD) Alliance:  www.pmdalliance.org [employer] Online:  Online Education Events Sign up for emails, which are sent weekly to give you updates on programming and online offerings  Parkinson's Association of the [place]:  www.[email or web address] Information on online support groups, education events, and online exercises including Yoga, Parkinson's exercises and more-LOTS of information on links to PD resources and online events Virtual  Support Group through Parkinson's Association of the [place]; next one is scheduled for Wednesday, May 3rd at  2 pm. (These are typically scheduled for the 1st Wednesday of the month at 2 pm).  Visit website for details. MOVEMENT AND EXERCISE OPPORTUNITIES Parkinson's DRUMMING Classes/Music Therapy with Doylene Canning:  This is a returning class and it's FREE!  2nd Mondays, continuing May 8th, 11:00 at the [place].  Contact *Misty Taylor-Paladino at [email or web address] or Doylene Canning at [phone] or [email or web address]  PWR! Moves Classes at [place].  Wednesdays 10 and 11 am.   Contact Amy Marriott, PT [email or web address] if interested. NEW PWR! Moves Class offering at [employer].  Wednesdays 1-2 pm, starting April 12th.  Contact Bryson Dames, Acupuncturist at [employer].  [email or web address] Here is a link to the PWR!Moves classes on Zoom from [place] - Daily Mon-Sat at 10:00. Via Zoom, FREE and open to all.  There is also a link below via Facebook if you use that platform.  [email or web address].si [email or web address]  Parkinson's Wellness Recovery (PWR! Moves)  www.[email or web address] Info on the PWR! Virtual Experience:  You will have access to our expertise through self-assessment, guided plans that start with the PD-specific fundamentals, educational content, tips, Q&A with an expert, and a growing Art therapist of PD-specific pre-recorded and live exercise classes of varying types and intensity - both physical and cognitive! If that is not enough, we offer 1:1 wellness consultations (in-person or virtual) to personalize your PWR! Research scientist (medical).  [place] Fridays:  As part of  the PD Health @ Home program, this free video series focuses each week on one aspect of fitness designed to support people living with Parkinson's.  These weekly videos highlight the [place] recent fitness guidelines for people with Parkinson's disease. ModemGamers.si Dance for PD website is offering free, live-stream classes throughout the week, as well as links to [employer] of classes:  [email or web address]/ Virtual dance and Pilates for Parkinson's classes: Click on the Community Tab> Parkinson's Movement Initiative Tab.  To register for classes and for more information, visit [email or web address] and click the "community" tab.  [employer] Parkinson's Cycling Classes  [employer]:  Thursdays @ Noon-Live classes at [employer] ([employer] at [email or web address] or [phone]) [employer]: Virtual Classes Mondays and Thursdays Jeanette Caprice classes Tuesday, Wednesday and Thursday (contact [place] at [email or web address]  or [phone]) [place] Varied levels of classes are offered Mondays, Tuesdays and Thursdays at [employer].  Stretching with Verdis Frederickson weekly class is also offered for people with Parkinson's To observe a class or for more information, call [phone] or email Hezzie Bump at [email or web address] .[email or web address] ADDITIONAL SUPPORT AND RESOURCES Well[employer]:Online Caregiver Education Opportunities:  www.well-springsolutions.org/caregiver-education/caregiver-support-group.  You may also contact Vickki Muff at [email or web address] or [phone].    Well-Spring Navigator:  [phone] program, a free service to help individuals and families through the journey of determining care for older adults.  The "Navigator" is a [employer], Education officer, museum, who will speak with a prospective client and/or loved ones to provide an assessment  of the situation and a set of recommendations for a personalized care plan -- all free of charge, and whether Well[employer] offers the needed service or not. If the need is not a service we provide, we are well-connected with reputable programs in town that we can refer you to.  www.well-springsolutions.org or to speak with the Navigator, call [phone].

## 2022-05-03 ENCOUNTER — Other Ambulatory Visit: Payer: Self-pay | Admitting: Neurology

## 2022-05-03 DIAGNOSIS — G20 Parkinson's disease: Secondary | ICD-10-CM

## 2022-05-17 ENCOUNTER — Other Ambulatory Visit: Payer: Self-pay | Admitting: Neurology

## 2022-05-17 ENCOUNTER — Telehealth: Payer: Self-pay | Admitting: Neurology

## 2022-05-17 DIAGNOSIS — G20 Parkinson's disease: Secondary | ICD-10-CM

## 2022-05-17 MED ORDER — CARBIDOPA-LEVODOPA 25-100 MG PO TABS: ORAL_TABLET | ORAL | 0 refills | Status: DC

## 2022-05-17 NOTE — Telephone Encounter (Signed)
Pt wife called informed that script was sent she was very thankful she said that with this he will have enough to last until the medication comes in from mail order, pt and wife was very thankful

## 2022-05-17 NOTE — Telephone Encounter (Signed)
Patients wife called and asked if they could get about 5 days worth of the carbidopa-levodopa sent to [facility] on [address].  They havent received the mail order.  They are leaving on the 16 to go out of town.

## 2022-05-31 DIAGNOSIS — L821 Other seborrheic keratosis: Secondary | ICD-10-CM | POA: Diagnosis not present

## 2022-05-31 DIAGNOSIS — L820 Inflamed seborrheic keratosis: Secondary | ICD-10-CM | POA: Diagnosis not present

## 2022-05-31 DIAGNOSIS — D485 Neoplasm of uncertain behavior of skin: Secondary | ICD-10-CM | POA: Diagnosis not present

## 2022-05-31 DIAGNOSIS — Z08 Encounter for follow-up examination after completed treatment for malignant neoplasm: Secondary | ICD-10-CM | POA: Diagnosis not present

## 2022-05-31 DIAGNOSIS — Z85828 Personal history of other malignant neoplasm of skin: Secondary | ICD-10-CM | POA: Diagnosis not present

## 2022-05-31 DIAGNOSIS — L538 Other specified erythematous conditions: Secondary | ICD-10-CM | POA: Diagnosis not present

## 2022-05-31 DIAGNOSIS — B351 Tinea unguium: Secondary | ICD-10-CM | POA: Diagnosis not present

## 2022-05-31 DIAGNOSIS — D225 Melanocytic nevi of trunk: Secondary | ICD-10-CM | POA: Diagnosis not present

## 2022-05-31 DIAGNOSIS — Z09 Encounter for follow-up examination after completed treatment for conditions other than malignant neoplasm: Secondary | ICD-10-CM | POA: Diagnosis not present

## 2022-05-31 DIAGNOSIS — L570 Actinic keratosis: Secondary | ICD-10-CM | POA: Diagnosis not present

## 2022-05-31 DIAGNOSIS — L814 Other melanin hyperpigmentation: Secondary | ICD-10-CM | POA: Diagnosis not present

## 2022-07-16 DIAGNOSIS — R32 Unspecified urinary incontinence: Secondary | ICD-10-CM | POA: Diagnosis not present

## 2022-07-16 DIAGNOSIS — M1990 Unspecified osteoarthritis, unspecified site: Secondary | ICD-10-CM | POA: Diagnosis not present

## 2022-07-16 DIAGNOSIS — Z85828 Personal history of other malignant neoplasm of skin: Secondary | ICD-10-CM | POA: Diagnosis not present

## 2022-07-16 DIAGNOSIS — G20 Parkinson's disease: Secondary | ICD-10-CM | POA: Diagnosis not present

## 2022-07-16 DIAGNOSIS — E785 Hyperlipidemia, unspecified: Secondary | ICD-10-CM | POA: Diagnosis not present

## 2022-07-16 DIAGNOSIS — Z8249 Family history of ischemic heart disease and other diseases of the circulatory system: Secondary | ICD-10-CM | POA: Diagnosis not present

## 2022-07-16 DIAGNOSIS — Z87440 Personal history of urinary (tract) infections: Secondary | ICD-10-CM | POA: Diagnosis not present

## 2022-07-16 DIAGNOSIS — Z85038 Personal history of other malignant neoplasm of large intestine: Secondary | ICD-10-CM | POA: Diagnosis not present

## 2022-07-16 DIAGNOSIS — N529 Male erectile dysfunction, unspecified: Secondary | ICD-10-CM | POA: Diagnosis not present

## 2022-07-16 DIAGNOSIS — N400 Enlarged prostate without lower urinary tract symptoms: Secondary | ICD-10-CM | POA: Diagnosis not present

## 2022-07-16 DIAGNOSIS — Z791 Long term (current) use of non-steroidal anti-inflammatories (NSAID): Secondary | ICD-10-CM | POA: Diagnosis not present

## 2022-07-16 DIAGNOSIS — I10 Essential (primary) hypertension: Secondary | ICD-10-CM | POA: Diagnosis not present

## 2022-08-30 DIAGNOSIS — G8929 Other chronic pain: Secondary | ICD-10-CM | POA: Diagnosis not present

## 2022-08-30 DIAGNOSIS — I10 Essential (primary) hypertension: Secondary | ICD-10-CM | POA: Diagnosis not present

## 2022-08-30 DIAGNOSIS — M5441 Lumbago with sciatica, right side: Secondary | ICD-10-CM | POA: Diagnosis not present

## 2022-10-29 ENCOUNTER — Other Ambulatory Visit: Payer: Self-pay | Admitting: Neurology

## 2022-10-29 DIAGNOSIS — G20A1 Parkinson's disease without dyskinesia, without mention of fluctuations: Secondary | ICD-10-CM

## 2022-11-05 NOTE — Progress Notes (Unsigned)
Assessment/Plan:   1.  Parkinsons Disease  -Continue carbidopa/levodopa 25/100, 2 at 9am, 2 at 1pm, 1 at 5pm (adjusted the times).  He looks underdosed but hadn't taken med since 9am and seen at 4pm (took medication in room)  -handicap parking form filled out  2.  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  Notes that Parkinson's slightly increases risk for melanoma.  4.  History of colon cancer  -Follows with oncology   Subjective:   Kevin Mathews was seen today in follow up for Parkinsons disease.  Pt with wife who supplements hx. My previous records were reviewed prior to todays visit as well as outside records available to me. Overall doing well from Parkinsons Disease standpoint.  Has new pcp.  One fall - tripped over bush outside.  Didn't get hurt.  No hallucinations.  Exercising with rsb (online 2 days per week, 1 day per week in person).  He is doing the dance class and spin class on Thursday.  Current prescribed movement disorder medications: Carbidopa/levodopa 25/100, 2 at 7, 2 at 11am, 1 at 4pm   ALLERGIES:  No Known Allergies  CURRENT MEDICATIONS:  Outpatient Encounter Medications as of 11/06/2022  Medication Sig   atorvastatin (LIPITOR) 10 MG tablet Take 10 mg by mouth 4 (four) times a week.    b complex vitamins capsule Take 1 capsule by mouth daily.   carbidopa-levodopa (SINEMET IR) 25-100 MG tablet Take 2 at 7AM , 2 at 11am, 1 at 4pm (Patient taking differently: Take 2 at 9AM , 2 at 1am, 1 at 5pm)   Cholecalciferol (CVS VITAMIN D3 PO) Take 1 tablet by mouth daily.   dexlansoprazole (DEXILANT) 60 MG capsule Take 60 mg by mouth daily as needed (acid reflux).   doxazosin (CARDURA) 4 MG tablet Take 4 mg by mouth every evening.    finasteride (PROSCAR) 5 MG tablet Take 1 tablet by mouth once daily   hydrochlorothiazide (MICROZIDE) 12.5 MG capsule Take  12.5 mg by mouth every evening.    ibuprofen (ADVIL,MOTRIN) 200 MG tablet Take 400 mg by mouth 2 (two) times daily as needed for headache or mild pain.    Multiple Vitamins-Minerals (ADULT GUMMY PO) Take 2 tablets by mouth daily.    No facility-administered encounter medications on file as of 11/06/2022.    Objective:   PHYSICAL EXAMINATION:    VITALS:   Vitals:   11/06/22 1529  BP: 116/66  Pulse: 61  SpO2: 99%  Weight: 184 lb 3.2 oz (83.6 kg)  Height: '5\' 6"'$  (1.676 m)       GEN:  The patient appears stated age and is in NAD. HEENT:  Normocephalic, atraumatic.  The mucous membranes are moist. The superficial temporal arteries are without ropiness or tenderness.   Neurological examination:  Orientation: The patient is alert and oriented x3. Cranial nerves: There is good facial symmetry with facial hypomimia. The speech is fluent and clear. Soft palate rises symmetrically and there is no tongue deviation. Hearing is intact to conversational tone. Sensation: Sensation is intact to light touch throughout Motor: Strength is at least antigravity x4.  Movement examination: Tone: There is mild increased tone in the LUE (he was seen at 4pm and last taken med at 9am) Abnormal movements: there is LUE rest tremor and RLE foot tremor (same as previous) Coordination:  There is no decremation with RAM's Gait  and Station: The patient has no difficulty arising out of a deep-seated chair without the use of the hands. The patient's stride length is good with LUE rest tremor  (same)  I have reviewed and interpreted the following labs independently    Chemistry      Component Value Date/Time   NA 140 02/27/2022 1026   K 3.8 02/27/2022 1026   CL 109 02/27/2022 1026   CO2 27 02/27/2022 1026   BUN 23 02/27/2022 1026   CREATININE 0.99 02/27/2022 1026      Component Value Date/Time   CALCIUM 9.4 02/27/2022 1026   ALKPHOS 87 02/27/2022 1026   AST 17 02/27/2022 1026   ALT <5 02/27/2022  1026   BILITOT 0.5 02/27/2022 1026       Lab Results  Component Value Date   WBC 5.3 02/27/2022   HGB 13.8 02/27/2022   HCT 42.0 02/27/2022   MCV 92.9 02/27/2022   PLT 174 02/27/2022    Lab Results  Component Value Date   TSH 3.74 07/22/2018     Cc:  Kathalene Frames, MD

## 2022-11-06 ENCOUNTER — Ambulatory Visit: Payer: Medicare HMO | Admitting: Neurology

## 2022-11-06 ENCOUNTER — Encounter: Payer: Self-pay | Admitting: Neurology

## 2022-11-06 VITALS — BP 116/66 | HR 61 | Ht 66.0 in | Wt 184.2 lb

## 2022-11-06 DIAGNOSIS — G20A1 Parkinson's disease without dyskinesia, without mention of fluctuations: Secondary | ICD-10-CM

## 2022-11-06 MED ORDER — CARBIDOPA-LEVODOPA 25-100 MG PO TABS: ORAL_TABLET | ORAL | 1 refills | Status: DC

## 2022-11-06 NOTE — Patient Instructions (Signed)
Local and Online Resources for Power over Parkinson's Group  November 2023    LOCAL [place] PARKINSON'S GROUPS   Power over Parkinson's Group:    Power Over Parkinson's Patient Education Group will be Wednesday, November 8th-*Hybrid meting*- in person at [facility] location and via [facility], 2:00-3:00 pm.   Starting in November, Power over [employer] and Care Partner Groups will meet together, with plans for separate break out session for caregivers (*this will be evolving over the next few months) Upcoming Power over Parkinson's Meetings/Care Partner Support:  2nd Wednesdays of the month at 2 pm:   November 8th, December 13th  [place] at [email or web address] if interested in participating in this group    [place] and Fall Prevention Workshop.  Thursday, November 9th 1-2pm, [place], [employer].  Register with Vonna Kotyk at [email or web address] or [phone] New PWR! Moves [employer] Instructor-Led Classes offering at [employer]!  TUESDAYS and Wednesdays 1-2 pm.   Contact Vonna Kotyk at  [email or web address]  or [phone] (Tuesday classes are modified for chair and standing only) Dance for Parkinson 's classes will be on Tuesdays 9:30am-10:30am starting October 3-December 12 with a break the week of November 21st. Located in the [employer] , in the first floor of the [employer] ([place].) To register:  [email or web address] or [phone]  Drumming for Parkinson's will be held on 2nd and 4th Mondays at 11:00 am.   Located at the [place] ([place]  [place] at [email or web address] or [phone]  Through support from the [place] for Parkinson's classes are free for both patients and caregivers.    [employer] Parkinson's Tai Chi  Class, Mondays at 11 am.  Call [phone] for details Parkinson's Holiday Party.  Wednesday, December 6th, 4:00-5:00 pm.  [facility] and Fitness.  RSVP to Garnetta Buddy at [phone] or [email or web address]   [place]:  [email or web address]  PD Health at Home continues:  Mindfulness Mondays, Wellness Wednesdays, Fitness Fridays   Upcoming Education:   Why Should you Participate in Parkinson's Research?  Wednesday, Nov. 29th,  1-2 pm  Expert Briefing:    Hallucinations and Delusions in Parkinson's.  Wednesday, Nov. 8th, 1-2 pm  Register for expert briefings (webinars) at [email or web address]  Please check out their website to sign up for emails and see their full online offerings      [place]:  [email or web address]   Third Thursday Webinars:  On the third Thursday of every month at 12 p.m. ET, join our free live webinars to learn about various aspects of living with Parkinson's disease and our work to speed medical breakthroughs.  Upcoming Webinar:  A Year Like No Other in Parkinson's Research:  2023 in Review.  Thursday, November 16th 12 noon. Check out additional information on their website to see their full online offerings    [facility]:  [email or web address]  Upcoming Webinar:   Stay tuned  Webinar Series:  Living with Parkinson's Meetup.   Third Thursdays each month, 3 pm  Care Partner Monthly Meetup.  With Robin Searing Phinney.  First Tuesday of each month, 2 pm  Check out additional information to Live Well Today on their website    Parkinson and Movement Disorders (PMD) Alliance:  www.[email or web address]  [employer]:  Online Education Events  Sign up for emails, which are sent weekly to give  you updates on programming and online offerings    Parkinson's Association of the [place]:  www.[email or web address]   Information on online support groups, education events, and online exercises including Yoga, Parkinson's exercises and more-LOTS of information on links to PD resources and online events  Virtual Support Group through Parkinson's Association of the [place]; next one is scheduled for Wednesday, November 1st  at 2 pm.  (These are typically scheduled for the 1st Wednesday of the month at 2 pm).  Visit website for details.   MOVEMENT AND EXERCISE OPPORTUNITIES  PWR! Moves Classes at [place].  Wednesdays 10 and 11 am.   Contact Amy Marriott, PT [email or web address] if interested.  NEW PWR! Moves Class offerings at [employer].  *TUESDAYS* and Wednesdays 1-2 pm.  Contact Vonna Kotyk at  [email or web address]    Parkinson's Wellness Recovery (PWR! Moves)  [email or web address]  Info on the PWR! Virtual Experience:  You will have access to our expertise?through self-assessment, guided plans that start with the PD-specific fundamentals, educational content, tips, Q&A with an expert, and a growing Art therapist of PD-specific pre-recorded and live exercise classes of varying types and intensity - both physical and cognitive! If that is not enough, we offer 1:1 wellness consultations (in-person or virtual) to personalize your PWR! Research scientist (medical).   [place] Fridays:   As part of the PD Health @ Home program, this free video series focuses each week on one aspect of fitness designed to support people living with Parkinson's.? These weekly videos highlight the [place] fitness guidelines for people with Parkinson's disease.  [email or web address]   Dance for PD website is offering free, live-stream classes throughout the week, as well as links to [employer] of classes:  [email or web address]  Virtual dance and Pilates for Parkinson's classes: Click on the Community Tab> Parkinson's  Movement Initiative Tab.  To register for classes and for more information, visit [email or web address] and click the "community" tab.   [employer] Parkinson's Cycling Classes   [employer]:  Thursdays @ Noon-Live classes at [employer] ([employer] at [email or web address]?or [phone])  [facility] [employer]: Virtual Classes Mondays and Thursdays Jeanette Caprice classes Tuesday, Wednesday and Thursday (contact [place] at [email or web address] ?or [phone])  [place]  Varied levels of classes are offered Tuesdays and Thursdays at [employer].   Stretching with Verdis Frederickson weekly class is also offered for people with Parkinson's  To observe a class or for more information, call [phone] or email Hezzie Bump at [email or web address]   ADDITIONAL SUPPORT AND RESOURCES  [employer]:Online Caregiver Education Opportunities:  www.[email or web address]/caregiver-education/[email or web address]-support-group.  You may also contact Vickki Muff at [email or web address] or [phone].     Well-Spring Navigator:  Just1Navigator program, a?free service to help individuals and families through the journey of determining care for older adults.  The "Navigator" is a Education officer, museum, Arnell Asal, who will speak with a prospective client and/or loved ones to provide an assessment of the situation and a set of recommendations for a personalized care plan -- all free of charge, and whether?Well[employer]Spring [employer] offers the needed service or not. If the need is not a service we provide, we are well-connected with reputable programs in town that we can refer you to.  www.well-[email or web address] or to speak with the Navigator, call [phone].

## 2022-11-28 ENCOUNTER — Encounter: Payer: Self-pay | Admitting: Neurology

## 2022-12-11 DIAGNOSIS — Z85828 Personal history of other malignant neoplasm of skin: Secondary | ICD-10-CM | POA: Diagnosis not present

## 2022-12-11 DIAGNOSIS — L578 Other skin changes due to chronic exposure to nonionizing radiation: Secondary | ICD-10-CM | POA: Diagnosis not present

## 2022-12-11 DIAGNOSIS — Z08 Encounter for follow-up examination after completed treatment for malignant neoplasm: Secondary | ICD-10-CM | POA: Diagnosis not present

## 2022-12-11 DIAGNOSIS — L814 Other melanin hyperpigmentation: Secondary | ICD-10-CM | POA: Diagnosis not present

## 2022-12-11 DIAGNOSIS — L821 Other seborrheic keratosis: Secondary | ICD-10-CM | POA: Diagnosis not present

## 2022-12-11 DIAGNOSIS — L570 Actinic keratosis: Secondary | ICD-10-CM | POA: Diagnosis not present

## 2022-12-18 DIAGNOSIS — R69 Illness, unspecified: Secondary | ICD-10-CM | POA: Diagnosis not present

## 2022-12-27 DIAGNOSIS — N400 Enlarged prostate without lower urinary tract symptoms: Secondary | ICD-10-CM | POA: Diagnosis not present

## 2022-12-27 DIAGNOSIS — R102 Pelvic and perineal pain: Secondary | ICD-10-CM | POA: Diagnosis not present

## 2023-01-01 DIAGNOSIS — R69 Illness, unspecified: Secondary | ICD-10-CM | POA: Diagnosis not present

## 2023-01-08 DIAGNOSIS — R69 Illness, unspecified: Secondary | ICD-10-CM | POA: Diagnosis not present

## 2023-01-15 DIAGNOSIS — R69 Illness, unspecified: Secondary | ICD-10-CM | POA: Diagnosis not present

## 2023-01-22 DIAGNOSIS — R69 Illness, unspecified: Secondary | ICD-10-CM | POA: Diagnosis not present

## 2023-01-29 DIAGNOSIS — R69 Illness, unspecified: Secondary | ICD-10-CM | POA: Diagnosis not present

## 2023-02-05 DIAGNOSIS — R69 Illness, unspecified: Secondary | ICD-10-CM | POA: Diagnosis not present

## 2023-02-12 DIAGNOSIS — R69 Illness, unspecified: Secondary | ICD-10-CM | POA: Diagnosis not present

## 2023-02-19 DIAGNOSIS — R69 Illness, unspecified: Secondary | ICD-10-CM | POA: Diagnosis not present

## 2023-02-26 DIAGNOSIS — R69 Illness, unspecified: Secondary | ICD-10-CM | POA: Diagnosis not present

## 2023-03-05 DIAGNOSIS — R69 Illness, unspecified: Secondary | ICD-10-CM | POA: Diagnosis not present

## 2023-03-12 DIAGNOSIS — R69 Illness, unspecified: Secondary | ICD-10-CM | POA: Diagnosis not present

## 2023-03-19 DIAGNOSIS — R69 Illness, unspecified: Secondary | ICD-10-CM | POA: Diagnosis not present

## 2023-04-09 DIAGNOSIS — R69 Illness, unspecified: Secondary | ICD-10-CM | POA: Diagnosis not present

## 2023-04-12 DIAGNOSIS — L578 Other skin changes due to chronic exposure to nonionizing radiation: Secondary | ICD-10-CM | POA: Diagnosis not present

## 2023-04-12 DIAGNOSIS — L814 Other melanin hyperpigmentation: Secondary | ICD-10-CM | POA: Diagnosis not present

## 2023-04-12 DIAGNOSIS — L244 Irritant contact dermatitis due to drugs in contact with skin: Secondary | ICD-10-CM | POA: Diagnosis not present

## 2023-04-12 DIAGNOSIS — L821 Other seborrheic keratosis: Secondary | ICD-10-CM | POA: Diagnosis not present

## 2023-04-12 DIAGNOSIS — L570 Actinic keratosis: Secondary | ICD-10-CM | POA: Diagnosis not present

## 2023-04-17 NOTE — Progress Notes (Signed)
Assessment/Plan:   1.  Parkinsons Disease  -Continue carbidopa/levodopa 25/100, 2 at 10am, 2 at 2pm, 1 at 6pm (adjusted the times again as his wake up time keeps getting later).    -We discussed that it used to be thought that levodopa would increase risk of melanoma but now it is believed that Parkinsons itself likely increases risk of melanoma. he is to get regular skin checks.  -participating in exercise and proud of him.  Information given to programs   2.  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  Notes that Parkinson's slightly increases risk for melanoma.  4.  History of colon cancer  -Follows with oncology   Subjective:   Kevin Mathews was seen today in follow up for Parkinsons disease.  Pt with wife who supplements hx. My previous records were reviewed prior to todays visit as well as outside records available to me.  Patient continues to do well from a Parkinson's standpoint.  He had one fall - he was in the bed of a pickup truck and getting out and fell.  He didn't get hurt.  No lightheadedness or near syncope or hallucinations.  Current prescribed movement disorder medications: Carbidopa/levodopa 25/100, 2 at 7, 2 at 11am, 1 at 4pm (he is now taking it at 10am/3pm/7pm)   ALLERGIES:  No Known Allergies  CURRENT MEDICATIONS:  Outpatient Encounter Medications as of 04/19/2023  Medication Sig   atorvastatin (LIPITOR) 10 MG tablet Take 10 mg by mouth 4 (four) times a week.    b complex vitamins capsule Take 1 capsule by mouth daily.   carbidopa-levodopa (SINEMET IR) 25-100 MG tablet Take 2 at 9AM , 2 at 1am, 1 at 5pm   Cholecalciferol (CVS VITAMIN D3 PO) Take 1 tablet by mouth daily.   dexlansoprazole (DEXILANT) 60 MG capsule Take 60 mg by mouth daily as needed (acid reflux).   doxazosin (CARDURA) 4 MG tablet Take 4 mg by mouth every evening.     finasteride (PROSCAR) 5 MG tablet Take 1 tablet by mouth once daily   hydrochlorothiazide (MICROZIDE) 12.5 MG capsule Take 12.5 mg by mouth every evening.    ibuprofen (ADVIL,MOTRIN) 200 MG tablet Take 400 mg by mouth 2 (two) times daily as needed for headache or mild pain.    Multiple Vitamins-Minerals (ADULT GUMMY PO) Take 2 tablets by mouth daily.    No facility-administered encounter medications on file as of 04/19/2023.    Objective:   PHYSICAL EXAMINATION:    VITALS:   Vitals:   04/19/23 1116  BP: 122/72  Pulse: 71  SpO2: 97%  Weight: 187 lb 9.6 oz (85.1 kg)  Height: 5\' 6"  (1.676 m)   GEN:  The patient appears stated age and is in NAD. HEENT:  Normocephalic, atraumatic.  The mucous membranes are moist. The superficial temporal arteries are without ropiness or tenderness.   Neurological examination:  Orientation: The patient is alert and oriented x3. Cranial nerves: There is good facial symmetry with facial hypomimia. The speech is fluent and clear. Soft palate rises symmetrically and there is no tongue deviation. Hearing is intact to conversational tone. Sensation: Sensation is intact to light touch throughout Motor: Strength is at least antigravity x4.  Movement examination: Tone: There is normal tone today Abnormal movements: there is LUE rest tremor and RLE foot tremor (same as previous) Coordination:  There is no decremation with  RAM's Gait and Station: The patient has no difficulty arising out of a deep-seated chair without the use of the hands. The patient's stride length is good with LUE rest tremor  (same) and decreased arm swing on the L  I have reviewed and interpreted the following labs independently    Chemistry      Component Value Date/Time   NA 140 02/27/2022 1026   K 3.8 02/27/2022 1026   CL 109 02/27/2022 1026   CO2 27 02/27/2022 1026   BUN 23 02/27/2022 1026   CREATININE 0.99 02/27/2022 1026      Component Value Date/Time   CALCIUM 9.4  02/27/2022 1026   ALKPHOS 87 02/27/2022 1026   AST 17 02/27/2022 1026   ALT <5 02/27/2022 1026   BILITOT 0.5 02/27/2022 1026       Lab Results  Component Value Date   WBC 5.3 02/27/2022   HGB 13.8 02/27/2022   HCT 42.0 02/27/2022   MCV 92.9 02/27/2022   PLT 174 02/27/2022    Lab Results  Component Value Date   TSH 3.74 07/22/2018     Cc:  Emilio Aspen, MD

## 2023-04-19 ENCOUNTER — Encounter: Payer: Self-pay | Admitting: Neurology

## 2023-04-19 ENCOUNTER — Ambulatory Visit: Payer: Medicare HMO | Admitting: Neurology

## 2023-04-19 VITALS — BP 122/72 | HR 71 | Ht 66.0 in | Wt 187.6 lb

## 2023-04-19 DIAGNOSIS — G20A1 Parkinson's disease without dyskinesia, without mention of fluctuations: Secondary | ICD-10-CM | POA: Diagnosis not present

## 2023-04-19 MED ORDER — CARBIDOPA-LEVODOPA 25-100 MG PO TABS: ORAL_TABLET | ORAL | 1 refills | Status: DC

## 2023-04-23 DIAGNOSIS — R69 Illness, unspecified: Secondary | ICD-10-CM | POA: Diagnosis not present

## 2023-04-30 DIAGNOSIS — R69 Illness, unspecified: Secondary | ICD-10-CM | POA: Diagnosis not present

## 2023-05-07 DIAGNOSIS — R69 Illness, unspecified: Secondary | ICD-10-CM | POA: Diagnosis not present

## 2023-05-09 ENCOUNTER — Ambulatory Visit: Payer: Medicare HMO | Admitting: Neurology

## 2023-05-09 DIAGNOSIS — I700 Atherosclerosis of aorta: Secondary | ICD-10-CM | POA: Diagnosis not present

## 2023-05-09 DIAGNOSIS — G20A1 Parkinson's disease without dyskinesia, without mention of fluctuations: Secondary | ICD-10-CM | POA: Diagnosis not present

## 2023-05-09 DIAGNOSIS — Z79899 Other long term (current) drug therapy: Secondary | ICD-10-CM | POA: Diagnosis not present

## 2023-05-09 DIAGNOSIS — Z1331 Encounter for screening for depression: Secondary | ICD-10-CM | POA: Diagnosis not present

## 2023-05-09 DIAGNOSIS — I10 Essential (primary) hypertension: Secondary | ICD-10-CM | POA: Diagnosis not present

## 2023-05-09 DIAGNOSIS — E7800 Pure hypercholesterolemia, unspecified: Secondary | ICD-10-CM | POA: Diagnosis not present

## 2023-05-09 DIAGNOSIS — G8929 Other chronic pain: Secondary | ICD-10-CM | POA: Diagnosis not present

## 2023-05-09 DIAGNOSIS — Z0000 Encounter for general adult medical examination without abnormal findings: Secondary | ICD-10-CM | POA: Diagnosis not present

## 2023-05-09 DIAGNOSIS — M545 Low back pain, unspecified: Secondary | ICD-10-CM | POA: Diagnosis not present

## 2023-05-09 DIAGNOSIS — Z85820 Personal history of malignant melanoma of skin: Secondary | ICD-10-CM | POA: Diagnosis not present

## 2023-05-09 DIAGNOSIS — Z23 Encounter for immunization: Secondary | ICD-10-CM | POA: Diagnosis not present

## 2023-05-09 DIAGNOSIS — N400 Enlarged prostate without lower urinary tract symptoms: Secondary | ICD-10-CM | POA: Diagnosis not present

## 2023-05-09 DIAGNOSIS — Z85038 Personal history of other malignant neoplasm of large intestine: Secondary | ICD-10-CM | POA: Diagnosis not present

## 2023-05-09 DIAGNOSIS — Z9181 History of falling: Secondary | ICD-10-CM | POA: Diagnosis not present

## 2023-05-09 DIAGNOSIS — D649 Anemia, unspecified: Secondary | ICD-10-CM | POA: Diagnosis not present

## 2023-05-14 DIAGNOSIS — R69 Illness, unspecified: Secondary | ICD-10-CM | POA: Diagnosis not present

## 2023-05-17 DIAGNOSIS — M5441 Lumbago with sciatica, right side: Secondary | ICD-10-CM | POA: Diagnosis not present

## 2023-05-17 DIAGNOSIS — M5459 Other low back pain: Secondary | ICD-10-CM | POA: Diagnosis not present

## 2023-05-17 DIAGNOSIS — M6281 Muscle weakness (generalized): Secondary | ICD-10-CM | POA: Diagnosis not present

## 2023-05-17 DIAGNOSIS — R2689 Other abnormalities of gait and mobility: Secondary | ICD-10-CM | POA: Diagnosis not present

## 2023-05-23 DIAGNOSIS — M6281 Muscle weakness (generalized): Secondary | ICD-10-CM | POA: Diagnosis not present

## 2023-05-23 DIAGNOSIS — M5441 Lumbago with sciatica, right side: Secondary | ICD-10-CM | POA: Diagnosis not present

## 2023-05-23 DIAGNOSIS — M5459 Other low back pain: Secondary | ICD-10-CM | POA: Diagnosis not present

## 2023-05-23 DIAGNOSIS — R2689 Other abnormalities of gait and mobility: Secondary | ICD-10-CM | POA: Diagnosis not present

## 2023-05-28 DIAGNOSIS — M6281 Muscle weakness (generalized): Secondary | ICD-10-CM | POA: Diagnosis not present

## 2023-05-28 DIAGNOSIS — R2689 Other abnormalities of gait and mobility: Secondary | ICD-10-CM | POA: Diagnosis not present

## 2023-05-28 DIAGNOSIS — M5441 Lumbago with sciatica, right side: Secondary | ICD-10-CM | POA: Diagnosis not present

## 2023-05-28 DIAGNOSIS — M5459 Other low back pain: Secondary | ICD-10-CM | POA: Diagnosis not present

## 2023-05-28 DIAGNOSIS — R69 Illness, unspecified: Secondary | ICD-10-CM | POA: Diagnosis not present

## 2023-05-30 DIAGNOSIS — M6281 Muscle weakness (generalized): Secondary | ICD-10-CM | POA: Diagnosis not present

## 2023-05-30 DIAGNOSIS — R2689 Other abnormalities of gait and mobility: Secondary | ICD-10-CM | POA: Diagnosis not present

## 2023-05-30 DIAGNOSIS — M5459 Other low back pain: Secondary | ICD-10-CM | POA: Diagnosis not present

## 2023-05-30 DIAGNOSIS — M5441 Lumbago with sciatica, right side: Secondary | ICD-10-CM | POA: Diagnosis not present

## 2023-06-04 DIAGNOSIS — R69 Illness, unspecified: Secondary | ICD-10-CM | POA: Diagnosis not present

## 2023-06-04 DIAGNOSIS — M6281 Muscle weakness (generalized): Secondary | ICD-10-CM | POA: Diagnosis not present

## 2023-06-04 DIAGNOSIS — M5441 Lumbago with sciatica, right side: Secondary | ICD-10-CM | POA: Diagnosis not present

## 2023-06-04 DIAGNOSIS — M5459 Other low back pain: Secondary | ICD-10-CM | POA: Diagnosis not present

## 2023-06-04 DIAGNOSIS — R2689 Other abnormalities of gait and mobility: Secondary | ICD-10-CM | POA: Diagnosis not present

## 2023-06-10 DIAGNOSIS — D509 Iron deficiency anemia, unspecified: Secondary | ICD-10-CM | POA: Diagnosis not present

## 2023-06-10 DIAGNOSIS — K2100 Gastro-esophageal reflux disease with esophagitis, without bleeding: Secondary | ICD-10-CM | POA: Diagnosis not present

## 2023-06-11 DIAGNOSIS — M5459 Other low back pain: Secondary | ICD-10-CM | POA: Diagnosis not present

## 2023-06-11 DIAGNOSIS — M6281 Muscle weakness (generalized): Secondary | ICD-10-CM | POA: Diagnosis not present

## 2023-06-11 DIAGNOSIS — M5441 Lumbago with sciatica, right side: Secondary | ICD-10-CM | POA: Diagnosis not present

## 2023-06-11 DIAGNOSIS — R2689 Other abnormalities of gait and mobility: Secondary | ICD-10-CM | POA: Diagnosis not present

## 2023-06-13 DIAGNOSIS — L814 Other melanin hyperpigmentation: Secondary | ICD-10-CM | POA: Diagnosis not present

## 2023-06-13 DIAGNOSIS — D225 Melanocytic nevi of trunk: Secondary | ICD-10-CM | POA: Diagnosis not present

## 2023-06-13 DIAGNOSIS — L570 Actinic keratosis: Secondary | ICD-10-CM | POA: Diagnosis not present

## 2023-06-13 DIAGNOSIS — L821 Other seborrheic keratosis: Secondary | ICD-10-CM | POA: Diagnosis not present

## 2023-06-13 DIAGNOSIS — L218 Other seborrheic dermatitis: Secondary | ICD-10-CM | POA: Diagnosis not present

## 2023-06-14 DIAGNOSIS — M6281 Muscle weakness (generalized): Secondary | ICD-10-CM | POA: Diagnosis not present

## 2023-06-14 DIAGNOSIS — R2689 Other abnormalities of gait and mobility: Secondary | ICD-10-CM | POA: Diagnosis not present

## 2023-06-14 DIAGNOSIS — M5441 Lumbago with sciatica, right side: Secondary | ICD-10-CM | POA: Diagnosis not present

## 2023-06-14 DIAGNOSIS — M5459 Other low back pain: Secondary | ICD-10-CM | POA: Diagnosis not present

## 2023-06-19 DIAGNOSIS — R2689 Other abnormalities of gait and mobility: Secondary | ICD-10-CM | POA: Diagnosis not present

## 2023-06-19 DIAGNOSIS — M6281 Muscle weakness (generalized): Secondary | ICD-10-CM | POA: Diagnosis not present

## 2023-06-19 DIAGNOSIS — M5459 Other low back pain: Secondary | ICD-10-CM | POA: Diagnosis not present

## 2023-06-19 DIAGNOSIS — M5441 Lumbago with sciatica, right side: Secondary | ICD-10-CM | POA: Diagnosis not present

## 2023-06-21 DIAGNOSIS — R2689 Other abnormalities of gait and mobility: Secondary | ICD-10-CM | POA: Diagnosis not present

## 2023-06-21 DIAGNOSIS — M6281 Muscle weakness (generalized): Secondary | ICD-10-CM | POA: Diagnosis not present

## 2023-06-21 DIAGNOSIS — M5459 Other low back pain: Secondary | ICD-10-CM | POA: Diagnosis not present

## 2023-06-21 DIAGNOSIS — M5441 Lumbago with sciatica, right side: Secondary | ICD-10-CM | POA: Diagnosis not present

## 2023-06-25 DIAGNOSIS — R69 Illness, unspecified: Secondary | ICD-10-CM | POA: Diagnosis not present

## 2023-06-26 DIAGNOSIS — M5459 Other low back pain: Secondary | ICD-10-CM | POA: Diagnosis not present

## 2023-06-26 DIAGNOSIS — M6281 Muscle weakness (generalized): Secondary | ICD-10-CM | POA: Diagnosis not present

## 2023-06-26 DIAGNOSIS — M5441 Lumbago with sciatica, right side: Secondary | ICD-10-CM | POA: Diagnosis not present

## 2023-06-26 DIAGNOSIS — R2689 Other abnormalities of gait and mobility: Secondary | ICD-10-CM | POA: Diagnosis not present

## 2023-06-28 DIAGNOSIS — M6281 Muscle weakness (generalized): Secondary | ICD-10-CM | POA: Diagnosis not present

## 2023-06-28 DIAGNOSIS — M5441 Lumbago with sciatica, right side: Secondary | ICD-10-CM | POA: Diagnosis not present

## 2023-06-28 DIAGNOSIS — R2689 Other abnormalities of gait and mobility: Secondary | ICD-10-CM | POA: Diagnosis not present

## 2023-06-28 DIAGNOSIS — M5459 Other low back pain: Secondary | ICD-10-CM | POA: Diagnosis not present

## 2023-07-03 DIAGNOSIS — M5441 Lumbago with sciatica, right side: Secondary | ICD-10-CM | POA: Diagnosis not present

## 2023-07-03 DIAGNOSIS — M6281 Muscle weakness (generalized): Secondary | ICD-10-CM | POA: Diagnosis not present

## 2023-07-03 DIAGNOSIS — R2689 Other abnormalities of gait and mobility: Secondary | ICD-10-CM | POA: Diagnosis not present

## 2023-07-03 DIAGNOSIS — M5459 Other low back pain: Secondary | ICD-10-CM | POA: Diagnosis not present

## 2023-07-05 DIAGNOSIS — M5459 Other low back pain: Secondary | ICD-10-CM | POA: Diagnosis not present

## 2023-07-05 DIAGNOSIS — M5441 Lumbago with sciatica, right side: Secondary | ICD-10-CM | POA: Diagnosis not present

## 2023-07-05 DIAGNOSIS — M6281 Muscle weakness (generalized): Secondary | ICD-10-CM | POA: Diagnosis not present

## 2023-07-05 DIAGNOSIS — R2689 Other abnormalities of gait and mobility: Secondary | ICD-10-CM | POA: Diagnosis not present

## 2023-07-09 DIAGNOSIS — R2689 Other abnormalities of gait and mobility: Secondary | ICD-10-CM | POA: Diagnosis not present

## 2023-07-09 DIAGNOSIS — M5441 Lumbago with sciatica, right side: Secondary | ICD-10-CM | POA: Diagnosis not present

## 2023-07-09 DIAGNOSIS — M5459 Other low back pain: Secondary | ICD-10-CM | POA: Diagnosis not present

## 2023-07-09 DIAGNOSIS — R69 Illness, unspecified: Secondary | ICD-10-CM | POA: Diagnosis not present

## 2023-07-09 DIAGNOSIS — M6281 Muscle weakness (generalized): Secondary | ICD-10-CM | POA: Diagnosis not present

## 2023-07-16 DIAGNOSIS — R69 Illness, unspecified: Secondary | ICD-10-CM | POA: Diagnosis not present

## 2023-07-19 DIAGNOSIS — M6281 Muscle weakness (generalized): Secondary | ICD-10-CM | POA: Diagnosis not present

## 2023-07-19 DIAGNOSIS — M5459 Other low back pain: Secondary | ICD-10-CM | POA: Diagnosis not present

## 2023-07-19 DIAGNOSIS — R2689 Other abnormalities of gait and mobility: Secondary | ICD-10-CM | POA: Diagnosis not present

## 2023-07-19 DIAGNOSIS — M5441 Lumbago with sciatica, right side: Secondary | ICD-10-CM | POA: Diagnosis not present

## 2023-07-26 DIAGNOSIS — M5459 Other low back pain: Secondary | ICD-10-CM | POA: Diagnosis not present

## 2023-07-26 DIAGNOSIS — R2689 Other abnormalities of gait and mobility: Secondary | ICD-10-CM | POA: Diagnosis not present

## 2023-07-26 DIAGNOSIS — M5441 Lumbago with sciatica, right side: Secondary | ICD-10-CM | POA: Diagnosis not present

## 2023-07-26 DIAGNOSIS — M6281 Muscle weakness (generalized): Secondary | ICD-10-CM | POA: Diagnosis not present

## 2023-07-30 DIAGNOSIS — R69 Illness, unspecified: Secondary | ICD-10-CM | POA: Diagnosis not present

## 2023-08-02 DIAGNOSIS — M6281 Muscle weakness (generalized): Secondary | ICD-10-CM | POA: Diagnosis not present

## 2023-08-02 DIAGNOSIS — M5459 Other low back pain: Secondary | ICD-10-CM | POA: Diagnosis not present

## 2023-08-02 DIAGNOSIS — M5441 Lumbago with sciatica, right side: Secondary | ICD-10-CM | POA: Diagnosis not present

## 2023-08-02 DIAGNOSIS — R2689 Other abnormalities of gait and mobility: Secondary | ICD-10-CM | POA: Diagnosis not present

## 2023-08-06 DIAGNOSIS — R69 Illness, unspecified: Secondary | ICD-10-CM | POA: Diagnosis not present

## 2023-08-09 DIAGNOSIS — M5459 Other low back pain: Secondary | ICD-10-CM | POA: Diagnosis not present

## 2023-08-09 DIAGNOSIS — M6281 Muscle weakness (generalized): Secondary | ICD-10-CM | POA: Diagnosis not present

## 2023-08-09 DIAGNOSIS — M5441 Lumbago with sciatica, right side: Secondary | ICD-10-CM | POA: Diagnosis not present

## 2023-08-09 DIAGNOSIS — R2689 Other abnormalities of gait and mobility: Secondary | ICD-10-CM | POA: Diagnosis not present

## 2023-08-12 DIAGNOSIS — Z791 Long term (current) use of non-steroidal anti-inflammatories (NSAID): Secondary | ICD-10-CM | POA: Diagnosis not present

## 2023-08-12 DIAGNOSIS — I2510 Atherosclerotic heart disease of native coronary artery without angina pectoris: Secondary | ICD-10-CM | POA: Diagnosis not present

## 2023-08-12 DIAGNOSIS — N400 Enlarged prostate without lower urinary tract symptoms: Secondary | ICD-10-CM | POA: Diagnosis not present

## 2023-08-12 DIAGNOSIS — Z809 Family history of malignant neoplasm, unspecified: Secondary | ICD-10-CM | POA: Diagnosis not present

## 2023-08-12 DIAGNOSIS — M1990 Unspecified osteoarthritis, unspecified site: Secondary | ICD-10-CM | POA: Diagnosis not present

## 2023-08-12 DIAGNOSIS — M545 Low back pain, unspecified: Secondary | ICD-10-CM | POA: Diagnosis not present

## 2023-08-12 DIAGNOSIS — E785 Hyperlipidemia, unspecified: Secondary | ICD-10-CM | POA: Diagnosis not present

## 2023-08-12 DIAGNOSIS — Z9181 History of falling: Secondary | ICD-10-CM | POA: Diagnosis not present

## 2023-08-12 DIAGNOSIS — Z85038 Personal history of other malignant neoplasm of large intestine: Secondary | ICD-10-CM | POA: Diagnosis not present

## 2023-08-12 DIAGNOSIS — I10 Essential (primary) hypertension: Secondary | ICD-10-CM | POA: Diagnosis not present

## 2023-08-12 DIAGNOSIS — Z974 Presence of external hearing-aid: Secondary | ICD-10-CM | POA: Diagnosis not present

## 2023-08-12 DIAGNOSIS — Z85828 Personal history of other malignant neoplasm of skin: Secondary | ICD-10-CM | POA: Diagnosis not present

## 2023-08-13 DIAGNOSIS — R69 Illness, unspecified: Secondary | ICD-10-CM | POA: Diagnosis not present

## 2023-08-13 DIAGNOSIS — R748 Abnormal levels of other serum enzymes: Secondary | ICD-10-CM | POA: Diagnosis not present

## 2023-08-15 DIAGNOSIS — R69 Illness, unspecified: Secondary | ICD-10-CM | POA: Diagnosis not present

## 2023-08-20 DIAGNOSIS — R69 Illness, unspecified: Secondary | ICD-10-CM | POA: Diagnosis not present

## 2023-08-21 DIAGNOSIS — M5459 Other low back pain: Secondary | ICD-10-CM | POA: Diagnosis not present

## 2023-08-21 DIAGNOSIS — R2689 Other abnormalities of gait and mobility: Secondary | ICD-10-CM | POA: Diagnosis not present

## 2023-08-21 DIAGNOSIS — M6281 Muscle weakness (generalized): Secondary | ICD-10-CM | POA: Diagnosis not present

## 2023-08-21 DIAGNOSIS — M5441 Lumbago with sciatica, right side: Secondary | ICD-10-CM | POA: Diagnosis not present

## 2023-08-22 DIAGNOSIS — R69 Illness, unspecified: Secondary | ICD-10-CM | POA: Diagnosis not present

## 2023-08-27 DIAGNOSIS — R69 Illness, unspecified: Secondary | ICD-10-CM | POA: Diagnosis not present

## 2023-08-29 DIAGNOSIS — R69 Illness, unspecified: Secondary | ICD-10-CM | POA: Diagnosis not present

## 2023-09-05 DIAGNOSIS — R2689 Other abnormalities of gait and mobility: Secondary | ICD-10-CM | POA: Diagnosis not present

## 2023-09-05 DIAGNOSIS — M5441 Lumbago with sciatica, right side: Secondary | ICD-10-CM | POA: Diagnosis not present

## 2023-09-05 DIAGNOSIS — M5459 Other low back pain: Secondary | ICD-10-CM | POA: Diagnosis not present

## 2023-09-05 DIAGNOSIS — M6281 Muscle weakness (generalized): Secondary | ICD-10-CM | POA: Diagnosis not present

## 2023-09-18 DIAGNOSIS — M5459 Other low back pain: Secondary | ICD-10-CM | POA: Diagnosis not present

## 2023-09-18 DIAGNOSIS — M6281 Muscle weakness (generalized): Secondary | ICD-10-CM | POA: Diagnosis not present

## 2023-09-18 DIAGNOSIS — M5441 Lumbago with sciatica, right side: Secondary | ICD-10-CM | POA: Diagnosis not present

## 2023-09-18 DIAGNOSIS — R2689 Other abnormalities of gait and mobility: Secondary | ICD-10-CM | POA: Diagnosis not present

## 2023-10-21 DIAGNOSIS — D649 Anemia, unspecified: Secondary | ICD-10-CM | POA: Diagnosis not present

## 2023-10-21 DIAGNOSIS — Z85038 Personal history of other malignant neoplasm of large intestine: Secondary | ICD-10-CM | POA: Diagnosis not present

## 2023-10-25 NOTE — Progress Notes (Unsigned)
Assessment/Plan:   1.  Parkinsons Disease  -Continue carbidopa/levodopa 25/100, 2 at 10am, 2 at 2pm, 1 at 6pm   -We discussed that it used to be thought that levodopa would increase risk of melanoma but now it is believed that Parkinsons itself likely increases risk of melanoma. he is to get regular skin checks.  -participating in exercise and proud of him.  Information given to programs   2.  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  Notes that Parkinson's slightly increases risk for melanoma.  4.  History of colon cancer  -Follows with oncology   Subjective:   Kevin Mathews was seen today in follow up for Parkinsons disease.  Pt with wife who supplements hx. My previous records were reviewed prior to todays visit as well as outside records available to me.  Patient continues to do well from a Parkinson's standpoint.  We adjusted the timing of his medication last visit to accommodate his later wake up time.  He denies any falls.  He denies any lightheadedness or near syncope.  He denies hallucinations.  Mood has been good.  Current prescribed movement disorder medications: Carbidopa/levodopa 25/100, 2 at 10am, 2 at 2pm, 1 at 6pm   ALLERGIES:  No Known Allergies  CURRENT MEDICATIONS:  Outpatient Encounter Medications as of 10/29/2023  Medication Sig   atorvastatin (LIPITOR) 10 MG tablet Take 10 mg by mouth 4 (four) times a week.    b complex vitamins capsule Take 1 capsule by mouth daily.   carbidopa-levodopa (SINEMET IR) 25-100 MG tablet Take 2 at 10AM , 2 at 2pm, 1 at 6pm   Cholecalciferol (CVS VITAMIN D3 PO) Take 1 tablet by mouth daily.   dexlansoprazole (DEXILANT) 60 MG capsule Take 60 mg by mouth daily as needed (acid reflux).   doxazosin (CARDURA) 4 MG tablet Take 4 mg by mouth every evening.    finasteride (PROSCAR) 5 MG tablet Take 1 tablet by mouth  once daily   hydrochlorothiazide (MICROZIDE) 12.5 MG capsule Take 12.5 mg by mouth every evening.    ibuprofen (ADVIL,MOTRIN) 200 MG tablet Take 400 mg by mouth 2 (two) times daily as needed for headache or mild pain.    Multiple Vitamins-Minerals (ADULT GUMMY PO) Take 2 tablets by mouth daily.    No facility-administered encounter medications on file as of 10/29/2023.    Objective:   PHYSICAL EXAMINATION:    VITALS:   There were no vitals filed for this visit.  GEN:  The patient appears stated age and is in NAD. HEENT:  Normocephalic, atraumatic.  The mucous membranes are moist. The superficial temporal arteries are without ropiness or tenderness.   Neurological examination:  Orientation: The patient is alert and oriented x3. Cranial nerves: There is good facial symmetry with facial hypomimia. The speech is fluent and clear. Soft palate rises symmetrically and there is no tongue deviation. Hearing is intact to conversational tone. Sensation: Sensation is intact to light touch throughout Motor: Strength is at least antigravity x4.  Movement examination: Tone: There is normal tone today Abnormal movements: there is LUE rest tremor and RLE foot tremor (same as previous) Coordination:  There is no decremation with RAM's Gait and Station: The patient has no difficulty arising out of a deep-seated chair without the use of the hands. The patient's stride length is good with LUE rest tremor  (same) and decreased  arm swing on the L  I have reviewed and interpreted the following labs independently    Chemistry      Component Value Date/Time   NA 140 02/27/2022 1026   K 3.8 02/27/2022 1026   CL 109 02/27/2022 1026   CO2 27 02/27/2022 1026   BUN 23 02/27/2022 1026   CREATININE 0.99 02/27/2022 1026      Component Value Date/Time   CALCIUM 9.4 02/27/2022 1026   ALKPHOS 87 02/27/2022 1026   AST 17 02/27/2022 1026   ALT <5 02/27/2022 1026   BILITOT 0.5 02/27/2022 1026       Lab  Results  Component Value Date   WBC 5.3 02/27/2022   HGB 13.8 02/27/2022   HCT 42.0 02/27/2022   MCV 92.9 02/27/2022   PLT 174 02/27/2022    Lab Results  Component Value Date   TSH 3.74 07/22/2018   Total time spent on today's visit was *** minutes, including both face-to-face time and nonface-to-face time.  Time included that spent on review of records (prior notes available to me/labs/imaging if pertinent), discussing treatment and goals, answering patient's questions and coordinating care.   Cc:  Emilio Aspen, MD

## 2023-10-29 ENCOUNTER — Ambulatory Visit: Payer: Medicare HMO | Admitting: Neurology

## 2023-10-29 ENCOUNTER — Encounter: Payer: Self-pay | Admitting: Neurology

## 2023-10-29 VITALS — BP 124/70 | HR 58 | Ht 66.0 in | Wt 187.2 lb

## 2023-10-29 DIAGNOSIS — G20A1 Parkinson's disease without dyskinesia, without mention of fluctuations: Secondary | ICD-10-CM | POA: Diagnosis not present

## 2023-11-07 DIAGNOSIS — D649 Anemia, unspecified: Secondary | ICD-10-CM | POA: Diagnosis not present

## 2023-11-12 DIAGNOSIS — D649 Anemia, unspecified: Secondary | ICD-10-CM | POA: Diagnosis not present

## 2023-11-16 DIAGNOSIS — R69 Illness, unspecified: Secondary | ICD-10-CM | POA: Diagnosis not present

## 2023-12-17 DIAGNOSIS — L218 Other seborrheic dermatitis: Secondary | ICD-10-CM | POA: Diagnosis not present

## 2023-12-17 DIAGNOSIS — L821 Other seborrheic keratosis: Secondary | ICD-10-CM | POA: Diagnosis not present

## 2023-12-17 DIAGNOSIS — L570 Actinic keratosis: Secondary | ICD-10-CM | POA: Diagnosis not present

## 2023-12-17 DIAGNOSIS — L814 Other melanin hyperpigmentation: Secondary | ICD-10-CM | POA: Diagnosis not present

## 2024-01-01 DIAGNOSIS — N401 Enlarged prostate with lower urinary tract symptoms: Secondary | ICD-10-CM | POA: Diagnosis not present

## 2024-01-01 DIAGNOSIS — R3914 Feeling of incomplete bladder emptying: Secondary | ICD-10-CM | POA: Diagnosis not present

## 2024-01-01 DIAGNOSIS — R102 Pelvic and perineal pain: Secondary | ICD-10-CM | POA: Diagnosis not present

## 2024-01-03 DIAGNOSIS — Z85038 Personal history of other malignant neoplasm of large intestine: Secondary | ICD-10-CM | POA: Diagnosis not present

## 2024-01-03 DIAGNOSIS — N400 Enlarged prostate without lower urinary tract symptoms: Secondary | ICD-10-CM | POA: Diagnosis not present

## 2024-01-03 DIAGNOSIS — E7800 Pure hypercholesterolemia, unspecified: Secondary | ICD-10-CM | POA: Diagnosis not present

## 2024-01-03 DIAGNOSIS — I10 Essential (primary) hypertension: Secondary | ICD-10-CM | POA: Diagnosis not present

## 2024-01-09 ENCOUNTER — Telehealth: Payer: Self-pay | Admitting: Neurology

## 2024-01-09 DIAGNOSIS — H35361 Drusen (degenerative) of macula, right eye: Secondary | ICD-10-CM | POA: Diagnosis not present

## 2024-01-09 DIAGNOSIS — H43813 Vitreous degeneration, bilateral: Secondary | ICD-10-CM | POA: Diagnosis not present

## 2024-01-09 DIAGNOSIS — H02403 Unspecified ptosis of bilateral eyelids: Secondary | ICD-10-CM | POA: Diagnosis not present

## 2024-01-09 DIAGNOSIS — H2513 Age-related nuclear cataract, bilateral: Secondary | ICD-10-CM | POA: Diagnosis not present

## 2024-01-09 NOTE — Telephone Encounter (Signed)
 Pharmacy has been changed we will need copy of patients new card at next appointment

## 2024-01-09 NOTE — Telephone Encounter (Signed)
 Pt wife called to let Dr.Tat know [employer] changed. It was mail order with Raina Bunting but its mail order with [facility] 25/100  Fax for [facility] [phone] Address- [address] [place], [place] [address] [facility]

## 2024-01-28 DIAGNOSIS — K080 Exfoliation of teeth due to systemic causes: Secondary | ICD-10-CM | POA: Diagnosis not present

## 2024-02-07 ENCOUNTER — Telehealth: Payer: Self-pay | Admitting: Neurology

## 2024-02-07 NOTE — Telephone Encounter (Signed)
 Pt. Needs refill [facility] [address] and further Refills with [employer] mail service of Rx Carbidopa-Levodopa, we just changed insurance on file today

## 2024-02-10 ENCOUNTER — Other Ambulatory Visit: Payer: Self-pay

## 2024-02-10 DIAGNOSIS — G20A1 Parkinson's disease without dyskinesia, without mention of fluctuations: Secondary | ICD-10-CM

## 2024-02-10 MED ORDER — CARBIDOPA-LEVODOPA 25-100 MG PO TABS: ORAL_TABLET | ORAL | 0 refills | Status: DC

## 2024-02-10 NOTE — Telephone Encounter (Signed)
 Sent in no refill till follow up appt with Dr.Rebecca Tat

## 2024-02-12 DIAGNOSIS — Z09 Encounter for follow-up examination after completed treatment for conditions other than malignant neoplasm: Secondary | ICD-10-CM | POA: Diagnosis not present

## 2024-02-12 DIAGNOSIS — L718 Other rosacea: Secondary | ICD-10-CM | POA: Diagnosis not present

## 2024-02-12 DIAGNOSIS — L578 Other skin changes due to chronic exposure to nonionizing radiation: Secondary | ICD-10-CM | POA: Diagnosis not present

## 2024-02-12 DIAGNOSIS — L570 Actinic keratosis: Secondary | ICD-10-CM | POA: Diagnosis not present

## 2024-02-12 DIAGNOSIS — L218 Other seborrheic dermatitis: Secondary | ICD-10-CM | POA: Diagnosis not present

## 2024-02-27 DIAGNOSIS — R0981 Nasal congestion: Secondary | ICD-10-CM | POA: Diagnosis not present

## 2024-02-27 DIAGNOSIS — R051 Acute cough: Secondary | ICD-10-CM | POA: Diagnosis not present

## 2024-02-27 DIAGNOSIS — M545 Low back pain, unspecified: Secondary | ICD-10-CM | POA: Diagnosis not present

## 2024-02-27 DIAGNOSIS — U071 COVID-19: Secondary | ICD-10-CM | POA: Diagnosis not present

## 2024-04-22 DIAGNOSIS — T6591XA Toxic effect of unspecified substance, accidental (unintentional), initial encounter: Secondary | ICD-10-CM | POA: Diagnosis not present

## 2024-04-28 ENCOUNTER — Ambulatory Visit: Payer: Medicare HMO | Admitting: Neurology

## 2024-04-28 NOTE — Progress Notes (Unsigned)
 Assessment/Plan:   1.  Parkinsons Disease  -Continue carbidopa /levodopa  25/100, 2 at 10am, 2 at 2pm, 1 at 6pm .  RF today  -We discussed that it used to be thought that levodopa  would increase risk of melanoma but now it is believed that Parkinsons itself likely increases risk of melanoma. he is to get regular skin checks.  -one of the falls was out of the bed in the middle of the night.  I asked him to put on bedrails from [place].     2.  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  Notes that Parkinson's slightly increases risk for melanoma.  4.  History of colon cancer  -Follows with oncology  5.  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  To discuss meds with PCP.  Is already starting to back down on BP meds to 4 days per week.  Monitor when standing.   Subjective:   Kevin Mathews was seen today in follow up for Parkinsons disease.  Pt with wife who supplements hx. My previous records were reviewed prior to todays visit as well as outside records available to me.  Patient remains on levodopa  and is doing well.  He has had no hallucinations.  No lightheadedness or near syncope.  He continues to follow with his dermatologist.  He did have COVID at the end of March, but recovered well.  He fell - with one he tried to step on a log instead of over it and he fell.  He didn't get hurt.   With another he fell out of the bed in the middle of the night.  This was first fall OOB.  Wife states he is usually quiet in the bed and doesn't usually jerk/kick.  C/o dizziness.  He was off of BP medication but now taking it 4 days per week.    Current prescribed movement disorder medications: Carbidopa /levodopa  25/100, 2 at 10am, 2 at 2pm, 1 at 6pm   ALLERGIES:  No Known Allergies  CURRENT MEDICATIONS:  Outpatient  Encounter Medications as of 04/30/2024  Medication Sig   atorvastatin  (LIPITOR) 10 MG tablet Take 10 mg by mouth 4 (four) times a week.    b complex vitamins capsule Take 1 capsule by mouth daily.   carbidopa -levodopa  (SINEMET  IR) 25-100 MG tablet Take 2 at 10AM , 2 at 2pm, 1 at 6pm   Cholecalciferol ([employer] VITAMIN D3 PO) Take 1 tablet by mouth daily.   doxazosin  (CARDURA ) 4 MG tablet Take 4 mg by mouth every evening.    finasteride  (PROSCAR ) 5 MG tablet Take 1 tablet by mouth once daily   hydrochlorothiazide  (MICROZIDE ) 12.5 MG capsule Take 12.5 mg by mouth every evening.    ibuprofen  (ADVIL ,MOTRIN ) 200 MG tablet Take 400 mg by mouth 2 (two) times daily as needed for headache or mild pain.    Multiple Vitamins-Minerals (ADULT GUMMY PO) Take 2 tablets by mouth daily.    omeprazole (PRILOSEC) 10 MG capsule Take 10 mg by mouth daily.   No facility-administered encounter medications on file as of 04/30/2024.    Objective:   PHYSICAL EXAMINATION:    VITALS:   Vitals:   04/30/24 1413  BP: 110/60  Pulse: 61  SpO2: 98%  Weight: 184 lb 6.4 oz (83.6 kg)     GEN:  The patient appears stated age and is in NAD. HEENT:  Normocephalic, atraumatic.  The mucous membranes are moist. The superficial temporal arteries are without ropiness or tenderness. CV:  RRR Lungs:  CTAB Neck:  no bruits  Pt seen at 2:30 pm but hasn't taken medication since 10am (hasn't gotten in 2pm dose yet)  Neurological examination:  Orientation: The patient is alert and oriented x3. Cranial nerves: There is good facial symmetry with facial hypomimia. The speech is fluent and clear. Soft palate rises symmetrically and there is no tongue deviation. Hearing is intact to conversational tone. Sensation: Sensation is intact to light touch throughout Motor: Strength is at least antigravity x4.  Movement examination: Tone: There is mild increased tone in the LUE (due for medication) Abnormal movements: there is LUE rest  tremor and RLE foot tremor (same as previous) Coordination:  There is no decremation with RAM's Gait and Station: The patient has no difficulty arising out of a deep-seated chair without the use of the hands. The patient's stride length is good with LUE rest tremor  (same) and decreased arm swing on the L (this is all stable)  I have reviewed and interpreted the following labs independently    Chemistry      Component Value Date/Time   NA 140 02/27/2022 1026   K 3.8 02/27/2022 1026   CL 109 02/27/2022 1026   CO2 27 02/27/2022 1026   BUN 23 02/27/2022 1026   CREATININE 0.99 02/27/2022 1026      Component Value Date/Time   CALCIUM  9.4 02/27/2022 1026   ALKPHOS 87 02/27/2022 1026   AST 17 02/27/2022 1026   ALT <5 02/27/2022 1026   BILITOT 0.5 02/27/2022 1026       Lab Results  Component Value Date   WBC 5.3 02/27/2022   HGB 13.8 02/27/2022   HCT 42.0 02/27/2022   MCV 92.9 02/27/2022   PLT 174 02/27/2022    Lab Results  Component Value Date   TSH 3.74 07/22/2018   Total time spent on today's visit was 30 minutes, including both face-to-face time and nonface-to-face time.  Time included that spent on review of records (prior notes available to me/labs/imaging if pertinent), discussing treatment and goals, answering patient's questions and coordinating care.   Cc:  Benedetta Bradley, MD

## 2024-04-30 ENCOUNTER — Ambulatory Visit: Payer: Medicare HMO | Admitting: Neurology

## 2024-04-30 VITALS — BP 110/60 | HR 61 | Wt 184.4 lb

## 2024-04-30 DIAGNOSIS — G20A1 Parkinson's disease without dyskinesia, without mention of fluctuations: Secondary | ICD-10-CM

## 2024-04-30 DIAGNOSIS — R42 Dizziness and giddiness: Secondary | ICD-10-CM | POA: Diagnosis not present

## 2024-04-30 MED ORDER — CARBIDOPA-LEVODOPA 25-100 MG PO TABS: ORAL_TABLET | ORAL | 2 refills | Status: DC

## 2024-04-30 NOTE — Patient Instructions (Signed)
 Get tuck in bedrails from [facility] for the bed  The physicians and staff at [facility] are committed to providing excellent care. You may receive a survey requesting feedback about your experience at our office. We strive to receive "very good" responses to the survey questions. If you feel that your experience would prevent you from giving the office a "very good " response, please contact our office to try to remedy the situation. We may be reached at [phone]. Thank you for taking the time out of your busy day to complete the survey.

## 2024-05-13 DIAGNOSIS — E7800 Pure hypercholesterolemia, unspecified: Secondary | ICD-10-CM | POA: Diagnosis not present

## 2024-05-13 DIAGNOSIS — I10 Essential (primary) hypertension: Secondary | ICD-10-CM | POA: Diagnosis not present

## 2024-05-13 DIAGNOSIS — D649 Anemia, unspecified: Secondary | ICD-10-CM | POA: Diagnosis not present

## 2024-05-13 DIAGNOSIS — K9089 Other intestinal malabsorption: Secondary | ICD-10-CM | POA: Diagnosis not present

## 2024-05-13 DIAGNOSIS — M545 Low back pain, unspecified: Secondary | ICD-10-CM | POA: Diagnosis not present

## 2024-05-13 DIAGNOSIS — N400 Enlarged prostate without lower urinary tract symptoms: Secondary | ICD-10-CM | POA: Diagnosis not present

## 2024-05-13 DIAGNOSIS — Z0000 Encounter for general adult medical examination without abnormal findings: Secondary | ICD-10-CM | POA: Diagnosis not present

## 2024-05-13 DIAGNOSIS — Z1331 Encounter for screening for depression: Secondary | ICD-10-CM | POA: Diagnosis not present

## 2024-05-13 DIAGNOSIS — Z79899 Other long term (current) drug therapy: Secondary | ICD-10-CM | POA: Diagnosis not present

## 2024-05-18 DIAGNOSIS — M545 Low back pain, unspecified: Secondary | ICD-10-CM | POA: Diagnosis not present

## 2024-06-03 DIAGNOSIS — D509 Iron deficiency anemia, unspecified: Secondary | ICD-10-CM | POA: Diagnosis not present

## 2024-06-23 DIAGNOSIS — L821 Other seborrheic keratosis: Secondary | ICD-10-CM | POA: Diagnosis not present

## 2024-06-23 DIAGNOSIS — L578 Other skin changes due to chronic exposure to nonionizing radiation: Secondary | ICD-10-CM | POA: Diagnosis not present

## 2024-06-23 DIAGNOSIS — L814 Other melanin hyperpigmentation: Secondary | ICD-10-CM | POA: Diagnosis not present

## 2024-06-23 DIAGNOSIS — D225 Melanocytic nevi of trunk: Secondary | ICD-10-CM | POA: Diagnosis not present

## 2024-07-14 DIAGNOSIS — D509 Iron deficiency anemia, unspecified: Secondary | ICD-10-CM | POA: Diagnosis not present

## 2024-07-14 DIAGNOSIS — I2510 Atherosclerotic heart disease of native coronary artery without angina pectoris: Secondary | ICD-10-CM | POA: Diagnosis not present

## 2024-08-05 DIAGNOSIS — D509 Iron deficiency anemia, unspecified: Secondary | ICD-10-CM | POA: Diagnosis not present

## 2024-08-26 DIAGNOSIS — M5459 Other low back pain: Secondary | ICD-10-CM | POA: Diagnosis not present

## 2024-08-26 DIAGNOSIS — R262 Difficulty in walking, not elsewhere classified: Secondary | ICD-10-CM | POA: Diagnosis not present

## 2024-09-02 DIAGNOSIS — M5459 Other low back pain: Secondary | ICD-10-CM | POA: Diagnosis not present

## 2024-09-02 DIAGNOSIS — R262 Difficulty in walking, not elsewhere classified: Secondary | ICD-10-CM | POA: Diagnosis not present

## 2024-09-04 DIAGNOSIS — R262 Difficulty in walking, not elsewhere classified: Secondary | ICD-10-CM | POA: Diagnosis not present

## 2024-09-04 DIAGNOSIS — M5459 Other low back pain: Secondary | ICD-10-CM | POA: Diagnosis not present

## 2024-09-09 DIAGNOSIS — M5459 Other low back pain: Secondary | ICD-10-CM | POA: Diagnosis not present

## 2024-09-09 DIAGNOSIS — R262 Difficulty in walking, not elsewhere classified: Secondary | ICD-10-CM | POA: Diagnosis not present

## 2024-09-18 DIAGNOSIS — R262 Difficulty in walking, not elsewhere classified: Secondary | ICD-10-CM | POA: Diagnosis not present

## 2024-09-18 DIAGNOSIS — M5459 Other low back pain: Secondary | ICD-10-CM | POA: Diagnosis not present

## 2024-09-23 DIAGNOSIS — M5459 Other low back pain: Secondary | ICD-10-CM | POA: Diagnosis not present

## 2024-09-23 DIAGNOSIS — R262 Difficulty in walking, not elsewhere classified: Secondary | ICD-10-CM | POA: Diagnosis not present

## 2024-09-25 DIAGNOSIS — R262 Difficulty in walking, not elsewhere classified: Secondary | ICD-10-CM | POA: Diagnosis not present

## 2024-09-25 DIAGNOSIS — M5459 Other low back pain: Secondary | ICD-10-CM | POA: Diagnosis not present

## 2024-09-30 DIAGNOSIS — M5459 Other low back pain: Secondary | ICD-10-CM | POA: Diagnosis not present

## 2024-09-30 DIAGNOSIS — R262 Difficulty in walking, not elsewhere classified: Secondary | ICD-10-CM | POA: Diagnosis not present

## 2024-10-02 DIAGNOSIS — R262 Difficulty in walking, not elsewhere classified: Secondary | ICD-10-CM | POA: Diagnosis not present

## 2024-10-02 DIAGNOSIS — M5459 Other low back pain: Secondary | ICD-10-CM | POA: Diagnosis not present

## 2024-10-07 DIAGNOSIS — R262 Difficulty in walking, not elsewhere classified: Secondary | ICD-10-CM | POA: Diagnosis not present

## 2024-10-07 DIAGNOSIS — M5459 Other low back pain: Secondary | ICD-10-CM | POA: Diagnosis not present

## 2024-10-14 DIAGNOSIS — R262 Difficulty in walking, not elsewhere classified: Secondary | ICD-10-CM | POA: Diagnosis not present

## 2024-10-14 DIAGNOSIS — M5459 Other low back pain: Secondary | ICD-10-CM | POA: Diagnosis not present

## 2024-10-26 DIAGNOSIS — R262 Difficulty in walking, not elsewhere classified: Secondary | ICD-10-CM | POA: Diagnosis not present

## 2024-10-26 DIAGNOSIS — M5459 Other low back pain: Secondary | ICD-10-CM | POA: Diagnosis not present

## 2024-11-02 DIAGNOSIS — R262 Difficulty in walking, not elsewhere classified: Secondary | ICD-10-CM | POA: Diagnosis not present

## 2024-11-02 DIAGNOSIS — M5459 Other low back pain: Secondary | ICD-10-CM | POA: Diagnosis not present

## 2024-11-09 DIAGNOSIS — M5459 Other low back pain: Secondary | ICD-10-CM | POA: Diagnosis not present

## 2024-11-09 DIAGNOSIS — R262 Difficulty in walking, not elsewhere classified: Secondary | ICD-10-CM | POA: Diagnosis not present

## 2024-11-11 DIAGNOSIS — G20A1 Parkinson's disease without dyskinesia, without mention of fluctuations: Secondary | ICD-10-CM | POA: Diagnosis not present

## 2024-11-11 DIAGNOSIS — I10 Essential (primary) hypertension: Secondary | ICD-10-CM | POA: Diagnosis not present

## 2024-11-11 DIAGNOSIS — R600 Localized edema: Secondary | ICD-10-CM | POA: Diagnosis not present

## 2024-11-11 DIAGNOSIS — N400 Enlarged prostate without lower urinary tract symptoms: Secondary | ICD-10-CM | POA: Diagnosis not present

## 2024-11-17 NOTE — Progress Notes (Unsigned)
 Assessment/Plan:   1.  Parkinsons Disease  -Continue carbidopa /levodopa  25/100, 2 at 10am, 2 at 2pm, 1 at 6pm .    -We discussed that it used to be thought that levodopa  would increase risk of melanoma but now it is believed that Parkinsons itself likely increases risk of melanoma. he is to get regular skin checks.  -one of the falls was out of the bed in the middle of the night.  I asked him to put on bedrails from [place].     2.  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  Notes that Parkinson's slightly increases risk for melanoma.  4.  History of colon cancer  -Follows with oncology  5.  Dizziness  -***Patient's primary care has done a great job in coordinating care and has discontinued his hydrochlorothiazide  and has changed his doxazosin  to tamsulosin.  6.  BPH -Follows with urology and as above he is now on tamsulosin Subjective:   Kevin Mathews was seen today in follow up for Parkinsons disease.  Pt with wife who supplements hx. My previous records were reviewed prior to todays visit as well as outside records available to me.  Patient remains on levodopa  and is doing well.  I have reviewed primary care records.  He has done a great job in care coordination in regards to patient's blood pressure and dizziness.  The doxazosin  was discontinued and changed to tamsulosin for prostate management.  Hydrochlorothiazide  has been discontinued.  Current prescribed movement disorder medications: Carbidopa /levodopa  25/100, 2 at 10am, 2 at 2pm, 1 at 6pm   ALLERGIES:  No Known Allergies  CURRENT MEDICATIONS:  Outpatient Encounter Medications as of 11/19/2024  Medication Sig   atorvastatin  (LIPITOR) 10 MG tablet Take 10 mg by mouth 4 (four) times a week.    b complex vitamins capsule Take 1 capsule by mouth daily.   carbidopa -levodopa  (SINEMET  IR)  25-100 MG tablet Take 2 at 10AM , 2 at 2pm, 1 at 6pm   Cholecalciferol ([employer] VITAMIN D3 PO) Take 1 tablet by mouth daily.   doxazosin  (CARDURA ) 4 MG tablet Take 4 mg by mouth every evening.    finasteride  (PROSCAR ) 5 MG tablet Take 1 tablet by mouth once daily   hydrochlorothiazide  (MICROZIDE ) 12.5 MG capsule Take 12.5 mg by mouth every evening.    ibuprofen  (ADVIL ,MOTRIN ) 200 MG tablet Take 400 mg by mouth 2 (two) times daily as needed for headache or mild pain.    Multiple Vitamins-Minerals (ADULT GUMMY PO) Take 2 tablets by mouth daily.    omeprazole (PRILOSEC) 10 MG capsule Take 10 mg by mouth daily.   No facility-administered encounter medications on file as of 11/19/2024.    Objective:   PHYSICAL EXAMINATION:    VITALS:   There were no vitals filed for this visit.    GEN:  The patient appears stated age and is in NAD. HEENT:  Normocephalic, atraumatic.  The mucous membranes are moist. The superficial temporal arteries are without ropiness or tenderness. CV:  RRR Lungs:  CTAB Neck:  no bruits  Pt seen at 2:30 pm but hasn't taken medication since 10am (hasn't gotten in 2pm dose yet)  Neurological examination:  Orientation: The patient is alert and oriented x3. Cranial nerves: There is good facial symmetry with facial hypomimia. The speech is fluent and clear. Soft palate rises symmetrically and  there is no tongue deviation. Hearing is intact to conversational tone. Sensation: Sensation is intact to light touch throughout Motor: Strength is at least antigravity x4.  Movement examination: Tone: There is mild increased tone in the LUE (due for medication) Abnormal movements: there is LUE rest tremor and RLE foot tremor (same as previous) Coordination:  There is no decremation with RAM's Gait and Station: The patient has no difficulty arising out of a deep-seated chair without the use of the hands. The patient's stride length is good with LUE rest tremor  (same) and decreased  arm swing on the L (this is all stable)  I have reviewed and interpreted the following labs independently    Chemistry      Component Value Date/Time   NA 140 02/27/2022 1026   K 3.8 02/27/2022 1026   CL 109 02/27/2022 1026   CO2 27 02/27/2022 1026   BUN 23 02/27/2022 1026   CREATININE 0.99 02/27/2022 1026      Component Value Date/Time   CALCIUM  9.4 02/27/2022 1026   ALKPHOS 87 02/27/2022 1026   AST 17 02/27/2022 1026   ALT <5 02/27/2022 1026   BILITOT 0.5 02/27/2022 1026       Lab Results  Component Value Date   WBC 5.3 02/27/2022   HGB 13.8 02/27/2022   HCT 42.0 02/27/2022   MCV 92.9 02/27/2022   PLT 174 02/27/2022    Lab Results  Component Value Date   TSH 3.74 07/22/2018   Total time spent on today's visit was *** minutes, including both face-to-face time and nonface-to-face time.  Time included that spent on review of records (prior notes available to me/labs/imaging if pertinent), discussing treatment and goals, answering patient's questions and coordinating care.   Cc:  Charlott Dorn LABOR, MD

## 2024-11-18 DIAGNOSIS — M5459 Other low back pain: Secondary | ICD-10-CM | POA: Diagnosis not present

## 2024-11-18 DIAGNOSIS — R600 Localized edema: Secondary | ICD-10-CM | POA: Diagnosis not present

## 2024-11-18 DIAGNOSIS — R262 Difficulty in walking, not elsewhere classified: Secondary | ICD-10-CM | POA: Diagnosis not present

## 2024-11-19 ENCOUNTER — Ambulatory Visit: Admitting: Neurology

## 2024-11-19 VITALS — BP 126/70 | HR 67 | Wt 184.6 lb

## 2024-11-19 DIAGNOSIS — G20A1 Parkinson's disease without dyskinesia, without mention of fluctuations: Secondary | ICD-10-CM

## 2024-11-19 DIAGNOSIS — R42 Dizziness and giddiness: Secondary | ICD-10-CM

## 2024-11-19 NOTE — Patient Instructions (Signed)
°  VISIT SUMMARY: You had a follow-up appointment today to discuss your Parkinson's disease and other health concerns. We reviewed your current medications, recent fall, and overall health status.  YOUR PLAN: -PARKINSON'S DISEASE: Parkinson's disease is a disorder of the nervous system that affects movement. You are currently managing it with carbidopa -levodopa , and you should continue taking 2 tablets at 10 AM, 2 tablets at 2 PM, and 1 tablet at 6 PM. You should also continue your regular exercise routine.  -HISTORY OF SQUAMOUS CELL CARCINOMA OF SCALP: Squamous cell carcinoma is a type of skin cancer. You have a history of this condition and are at increased risk for melanoma due to Parkinson's disease. Continue with your annual dermatology checks to monitor your skin health.  INSTRUCTIONS: Please continue with your current medication schedule and exercise routine. Make sure to attend your annual dermatology checks. If you experience any new symptoms or have concerns, schedule a follow-up appointment.                      Contains text generated by [employer].                                 Contains text generated by [employer].

## 2025-01-06 ENCOUNTER — Other Ambulatory Visit: Payer: Self-pay

## 2025-01-06 DIAGNOSIS — G20A1 Parkinson's disease without dyskinesia, without mention of fluctuations: Secondary | ICD-10-CM

## 2025-01-06 MED ORDER — CARBIDOPA-LEVODOPA 25-100 MG PO TABS: ORAL_TABLET | ORAL | 0 refills | Status: AC

## 2025-05-31 ENCOUNTER — Ambulatory Visit: Payer: Self-pay | Admitting: Neurology
